# Patient Record
Sex: Female | Born: 1935 | Race: Black or African American | Hispanic: No | Smoking: Never smoker
Health system: Southern US, Community
[De-identification: ages and names within clinical notes are randomized; demographics above are authoritative.]

## PROBLEM LIST (undated history)

## (undated) DIAGNOSIS — Z923 Personal history of irradiation: Secondary | ICD-10-CM

## (undated) DIAGNOSIS — I10 Essential (primary) hypertension: Secondary | ICD-10-CM

## (undated) DIAGNOSIS — E785 Hyperlipidemia, unspecified: Secondary | ICD-10-CM

## (undated) DIAGNOSIS — M1990 Unspecified osteoarthritis, unspecified site: Secondary | ICD-10-CM

## (undated) DIAGNOSIS — N189 Chronic kidney disease, unspecified: Secondary | ICD-10-CM

## (undated) DIAGNOSIS — C50919 Malignant neoplasm of unspecified site of unspecified female breast: Secondary | ICD-10-CM

## (undated) DIAGNOSIS — Z9889 Other specified postprocedural states: Secondary | ICD-10-CM

## (undated) DIAGNOSIS — C50411 Malignant neoplasm of upper-outer quadrant of right female breast: Principal | ICD-10-CM

## (undated) DIAGNOSIS — N390 Urinary tract infection, site not specified: Secondary | ICD-10-CM

## (undated) DIAGNOSIS — R112 Nausea with vomiting, unspecified: Secondary | ICD-10-CM

## (undated) HISTORY — PX: BREAST BIOPSY: SHX20

## (undated) HISTORY — PX: CATARACT EXTRACTION: SUR2

## (undated) HISTORY — DX: Essential (primary) hypertension: I10

## (undated) HISTORY — PX: BREAST LUMPECTOMY: SHX2

## (undated) HISTORY — DX: Chronic kidney disease, unspecified: N18.9

## (undated) HISTORY — DX: Malignant neoplasm of upper-outer quadrant of right female breast: C50.411

## (undated) HISTORY — DX: Hyperlipidemia, unspecified: E78.5

## (undated) HISTORY — DX: Urinary tract infection, site not specified: N39.0

## (undated) HISTORY — DX: Unspecified osteoarthritis, unspecified site: M19.90

## (undated) HISTORY — PX: COLONOSCOPY: SHX174

---
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
## 1976-03-05 LAB — HM PAP SMEAR

## 1978-02-21 HISTORY — PX: ABDOMINAL HYSTERECTOMY: SHX81

## 1987-02-22 HISTORY — PX: CHOLECYSTECTOMY: SHX55

## 2002-12-03 ENCOUNTER — Encounter: Admission: RE | Admit: 2002-12-03 | Discharge: 2002-12-17 | Payer: Self-pay | Admitting: *Deleted

## 2006-04-05 LAB — HM COLONOSCOPY

## 2006-04-12 ENCOUNTER — Ambulatory Visit (HOSPITAL_COMMUNITY): Admission: RE | Admit: 2006-04-12 | Discharge: 2006-04-12 | Payer: Self-pay | Admitting: Gastroenterology

## 2010-10-04 ENCOUNTER — Ambulatory Visit (INDEPENDENT_AMBULATORY_CARE_PROVIDER_SITE_OTHER): Payer: No Typology Code available for payment source | Admitting: Family Medicine

## 2010-10-04 ENCOUNTER — Encounter: Payer: Self-pay | Admitting: Family Medicine

## 2010-10-04 DIAGNOSIS — E119 Type 2 diabetes mellitus without complications: Secondary | ICD-10-CM

## 2010-10-04 DIAGNOSIS — N200 Calculus of kidney: Secondary | ICD-10-CM

## 2010-10-04 DIAGNOSIS — E785 Hyperlipidemia, unspecified: Secondary | ICD-10-CM

## 2010-10-04 DIAGNOSIS — I10 Essential (primary) hypertension: Secondary | ICD-10-CM

## 2010-10-04 LAB — LIPID PANEL
HDL: 76.5 mg/dL (ref >39.00)
Total CHOL/HDL Ratio: 2
VLDL: 18.6 mg/dL (ref 0.0–40.0)

## 2010-10-04 LAB — BASIC METABOLIC PANEL
Calcium: 8.8 mg/dL (ref 8.4–10.5)
GFR: 106.78 mL/min (ref >60.00)
Glucose, Bld: 234 mg/dL — ABNORMAL HIGH (ref 70–99)
Potassium: 3.8 mEq/L (ref 3.5–5.1)
Sodium: 138 mEq/L (ref 135–145)

## 2010-10-04 LAB — HEPATIC FUNCTION PANEL
AST: 21 U/L (ref 0–37)
Albumin: 3.9 g/dL (ref 3.5–5.2)
Alkaline Phosphatase: 71 U/L (ref 39–117)
Bilirubin, Direct: 0 mg/dL (ref 0.0–0.3)

## 2010-10-04 MED ORDER — HYDROCHLOROTHIAZIDE 25 MG PO TABS: 25.0000 mg | ORAL_TABLET | Freq: Every day | ORAL | Status: DC

## 2010-10-04 NOTE — Progress Notes (Signed)
  Subjective:    Patient ID: [name] [name], female    DOB: [birth year], 75 y.o.   MRN: [id number]  HPI New patient to establish care. Past medical history significant for osteoarthritis, type 2 diabetes, hypertension, hyperlipidemia, and kidney stones. Multiple drug allergies as listed. Previous intolerance to Actos. Cannot take lisinopril secondary to cough and chest pain. Currently only takes metformin 500 mg once daily and baby aspirin 81 mg. Previously on Zocor and stopped 2 weeks ago after noticed some itching. Itching resolved after stopping Zocor. No history of CAD.  Report of Pneumovax age 57. Refuses flu vaccines. Previous intolerance. Not monitoring blood sugars or blood pressure at home. Currently not taking any blood pressure medication  Mother had uterine cancer. Father had heart disease age 44. Brothers and sisters with diabetes.  Patient is widowed. She is retired. 3 children and 4 grandchildren. No smoking history. No alcohol use.   Review of Systems  Constitutional: Negative for appetite change and fatigue.  HENT: Negative for trouble swallowing.   Eyes: Negative for visual disturbance.  Respiratory: Negative for cough, chest tightness, shortness of breath and wheezing.   Cardiovascular: Negative for chest pain, palpitations and leg swelling.  Gastrointestinal: Negative for abdominal pain.  Genitourinary: Negative for dysuria.  Skin: Negative for rash.  Neurological: Negative for dizziness, seizures, syncope, weakness, light-headedness and headaches.  Hematological: Negative for adenopathy.  Psychiatric/Behavioral: Negative for dysphoric mood.       Objective:   Physical Exam  Constitutional: She is oriented to person, place, and time. She appears well-developed and well-nourished.  HENT:  Right Ear: External ear normal.  Left Ear: External ear normal.  Mouth/Throat: Oropharynx is clear and moist.  Eyes: Pupils are equal, round, and reactive to light.  Neck: Neck  supple. No thyromegaly present.  Cardiovascular: Normal rate and regular rhythm.  Exam reveals no gallop.   No murmur heard. Pulmonary/Chest: Effort normal and breath sounds normal. No respiratory distress. She has no wheezes. She has no rales.  Musculoskeletal: She exhibits no edema.       Feet reveal no skin lesions. Good distal foot pulses. Good capillary refill. No calluses. Normal sensation with monofilament testing   Lymphadenopathy:    She has no cervical adenopathy.  Neurological: She is alert and oriented to person, place, and time. No cranial nerve deficit.  Psychiatric: She has a normal mood and affect. Her behavior is normal.          Assessment & Plan:  #1 type 2 diabetes. Reassess A1c #2 hyperlipidemia. Previous reported questionable intolerance to Zocor off medication for 2 weeks now. Reassess lipids. Consider Lipitor if up. #3 hypertension currently untreated. HCTZ 25 mg once daily and reassess blood pressure one month. Check basic metabolic panel.  Consider ARB then if still up with ? Intolerance to ACE in past. #4 history of kidney stones

## 2010-10-05 ENCOUNTER — Other Ambulatory Visit: Payer: Self-pay | Admitting: Family Medicine

## 2010-10-05 MED ORDER — METFORMIN HCL ER 500 MG PO TB24: 500.0000 mg | ORAL_TABLET | Freq: Two times a day (BID) | ORAL | Status: DC

## 2010-10-05 MED ORDER — ATORVASTATIN CALCIUM 20 MG PO TABS: 20.0000 mg | ORAL_TABLET | Freq: Every day | ORAL | Status: DC

## 2010-10-05 NOTE — Progress Notes (Signed)
Quick Note:  Pt informed and she has OV scheduled already in one month ______

## 2010-10-13 ENCOUNTER — Telehealth: Payer: Self-pay

## 2010-10-13 NOTE — Telephone Encounter (Signed)
Pt called to see if Rx for metformin has been sent to pharmacy.  Pt aware that it has

## 2010-11-04 ENCOUNTER — Encounter: Payer: Self-pay | Admitting: Family Medicine

## 2010-11-04 ENCOUNTER — Ambulatory Visit (INDEPENDENT_AMBULATORY_CARE_PROVIDER_SITE_OTHER): Payer: No Typology Code available for payment source | Admitting: Family Medicine

## 2010-11-04 DIAGNOSIS — E119 Type 2 diabetes mellitus without complications: Secondary | ICD-10-CM

## 2010-11-04 DIAGNOSIS — I10 Essential (primary) hypertension: Secondary | ICD-10-CM

## 2010-11-04 DIAGNOSIS — E785 Hyperlipidemia, unspecified: Secondary | ICD-10-CM

## 2010-11-04 MED ORDER — ONETOUCH LANCETS MISC: Status: DC

## 2010-11-04 MED ORDER — METFORMIN HCL ER 500 MG PO TB24: 500.0000 mg | ORAL_TABLET | Freq: Two times a day (BID) | ORAL | Status: DC

## 2010-11-04 NOTE — Progress Notes (Signed)
  Subjective:    Patient ID: [name] [name], female    DOB: [birth year], 76 y.o.   MRN: [id number]  HPI Medical followup. She has history of obesity, type 2 diabetes, hypertension, and hyperlipidemia. Recent A1c 8.5%. Fasting blood sugars around 140-160. We increased her metformin. She started a walking program and has already lost about 3 pounds. Eye exam in July reportedly normal.  Recent itching with simvastatin. We switched to Lipitor and she has had no adverse side effects. We plan repeat lipids in about 2 months. No history of CAD or peripheral vascular disease. History of elevated blood pressure. Initiated HCTZ last visit and blood pressure improved. No orthostasis. Previous reported itching with ACE inhibitor   Review of Systems  Constitutional: Negative for appetite change, fatigue and unexpected weight change.  Respiratory: Negative for cough and shortness of breath.   Cardiovascular: Negative for chest pain and leg swelling.  Genitourinary: Negative for dysuria.  Neurological: Negative for dizziness, syncope and headaches.  Psychiatric/Behavioral: Negative for dysphoric mood.       Objective:   Physical Exam  Constitutional: She is oriented to person, place, and time. She appears well-developed and well-nourished.  HENT:  Mouth/Throat: Oropharynx is clear and moist.  Neck: Neck supple. No thyromegaly present.  Cardiovascular: Normal rate, regular rhythm and normal heart sounds.   Pulmonary/Chest: Effort normal and breath sounds normal. No respiratory distress. She has no wheezes. She has no rales.  Musculoskeletal: She exhibits no edema.  Neurological: She is alert and oriented to person, place, and time. No cranial nerve deficit.  Psychiatric: She has a normal mood and affect. Her behavior is normal.          Assessment & Plan:  #1 hypertension. Improved. Continue current medication. Continue weight loss efforts. #2 type 2 diabetes. History of suboptimal control.  Recheck A1c at followup in 2 months. Continue weight loss efforts. New home glucose monitor given #3 hyperlipidemia. Recent change to Lipitor. No intolerance. Reassess lipids in 2 months

## 2010-11-04 NOTE — Patient Instructions (Signed)
Continue walking and weight loss efforts. We will plan to check your cholesterol and hemoglobin A1c at followup in 2 months

## 2010-11-18 ENCOUNTER — Ambulatory Visit: Payer: No Typology Code available for payment source | Admitting: Family Medicine

## 2011-01-11 ENCOUNTER — Ambulatory Visit (INDEPENDENT_AMBULATORY_CARE_PROVIDER_SITE_OTHER): Payer: No Typology Code available for payment source | Admitting: Family Medicine

## 2011-01-11 ENCOUNTER — Encounter: Payer: Self-pay | Admitting: Family Medicine

## 2011-01-11 DIAGNOSIS — I10 Essential (primary) hypertension: Secondary | ICD-10-CM

## 2011-01-11 DIAGNOSIS — E785 Hyperlipidemia, unspecified: Secondary | ICD-10-CM

## 2011-01-11 DIAGNOSIS — E119 Type 2 diabetes mellitus without complications: Secondary | ICD-10-CM

## 2011-01-11 LAB — LIPID PANEL
HDL: 63 mg/dL (ref >39.00)
Total CHOL/HDL Ratio: 2
VLDL: 16 mg/dL (ref 0.0–40.0)

## 2011-01-11 LAB — HEMOGLOBIN A1C: Hgb A1c MFr Bld: 9.2 % — ABNORMAL HIGH (ref 4.6–6.5)

## 2011-01-11 LAB — HEPATIC FUNCTION PANEL: Total Bilirubin: 1.4 mg/dL — ABNORMAL HIGH (ref 0.3–1.2)

## 2011-01-11 NOTE — Progress Notes (Signed)
  Subjective:    Patient ID: [name] [name], female    DOB: 05/06/1934, 76 y.o.   MRN: [id number]  HPI Patient seen for medical followup. Recently switched lipid medicine to Lipitor. Tolerating well. Takes HCTZ for hypertension. No dizziness. Patient's blood sugars not well controlled by home fasting readings. Mostly fasting from 150-180. Recent A1c 8.4%. We titrated her metformin then. Denies any side effects. No chest pains or dizziness.  Compliant with all medications.  Past Medical History  Diagnosis Date  . Arthritis   . Diabetes mellitus   . Hyperlipidemia   . Hypertension   . Chronic kidney disease     stones  . UTI (urinary tract infection)    Past Surgical History  Procedure Date  . Cholecystectomy 1989  . Tonsillectomy 1953  . Abdominal hysterectomy 1980    reports that she has never smoked. She does not have any smokeless tobacco history on file. Her alcohol and drug histories not on file. family history includes Cancer in her mother; Diabetes in her brother and sister; and Heart disease (age of onset:66) in her father. Allergies  Allergen Reactions  . Actos (Pioglitazone Hydrochloride)   . Lisinopril   . Penicillins   . Zocor (Simvastatin - High Dose)     itiching      Review of Systems  Constitutional: Negative for fever, appetite change, fatigue and unexpected weight change.  Eyes: Negative for visual disturbance.  Respiratory: Negative for cough and shortness of breath.   Cardiovascular: Negative for chest pain.  Gastrointestinal: Negative for abdominal pain.  Genitourinary: Negative for dysuria.  Neurological: Negative for dizziness and syncope.       Objective:   Physical Exam  Constitutional: She is oriented to person, place, and time. She appears well-developed and well-nourished. No distress.  Neck: Neck supple. No thyromegaly present.  Cardiovascular: Normal rate, regular rhythm and normal heart sounds.   No murmur heard. Pulmonary/Chest:  Effort normal and breath sounds normal. No respiratory distress. She has no wheezes. She has no rales.  Musculoskeletal: She exhibits no edema.  Lymphadenopathy:    She has no cervical adenopathy.  Neurological: She is alert and oriented to person, place, and time.          Assessment & Plan:  #1 type 2 diabetes. History of poor control. Reassess A1c. If still up consider titration of metformin versus additional medication  #2 hyperlipidemia. Check lipid and hepatic panel. #3 hypertension. Marginal control. Work on weight loss and consider addition of angiotensin receptor blocker in 3 months if not further improved

## 2011-01-12 ENCOUNTER — Other Ambulatory Visit: Payer: Self-pay | Admitting: Family Medicine

## 2011-01-12 MED ORDER — GLIMEPIRIDE 4 MG PO TABS: 4.0000 mg | ORAL_TABLET | ORAL | Status: DC

## 2011-01-12 NOTE — Progress Notes (Signed)
Quick Note:  Spoke with pt - informed of labs and change to med list - pt does have appt in 3 mos - ______

## 2011-03-09 ENCOUNTER — Encounter: Payer: Self-pay | Admitting: Family Medicine

## 2011-03-09 ENCOUNTER — Ambulatory Visit (INDEPENDENT_AMBULATORY_CARE_PROVIDER_SITE_OTHER): Payer: No Typology Code available for payment source | Admitting: Family Medicine

## 2011-03-09 VITALS — BP 140/82 | Temp 97.7°F | Wt 191.0 lb

## 2011-03-09 DIAGNOSIS — J209 Acute bronchitis, unspecified: Secondary | ICD-10-CM

## 2011-03-09 MED ORDER — BENZONATATE 200 MG PO CAPS: 200.0000 mg | ORAL_CAPSULE | Freq: Three times a day (TID) | ORAL | Status: AC | PRN

## 2011-03-09 NOTE — Progress Notes (Signed)
  Subjective:    Patient ID: [name] [name], female    DOB: 06/22/1934, 77 y.o.   MRN: [id number]  HPI  Patient seen with 2 week history of dry cough. No fever. No dyspnea. No chills. Cough actually somewhat improved. Initially had some sneezing and nasal congestion which is resolved. She's not taking medications. Chronic problems include history of type 2 diabetes hypertension, and hyperlipidemia. Not taking any of her medications today.   Review of Systems  Constitutional: Negative for fever, chills and fatigue.  HENT: Negative for ear pain and congestion.   Respiratory: Positive for cough. Negative for shortness of breath and wheezing.   Cardiovascular: Negative for chest pain, palpitations and leg swelling.       Objective:   Physical Exam  Constitutional: She appears well-developed and well-nourished.  HENT:  Right Ear: External ear normal.  Left Ear: External ear normal.  Mouth/Throat: Oropharynx is clear and moist.  Neck: Neck supple.  Cardiovascular: Normal rate and regular rhythm.   Pulmonary/Chest: Effort normal and breath sounds normal. No respiratory distress. She has no wheezes. She has no rales.  Musculoskeletal: She exhibits no edema.  Lymphadenopathy:    She has no cervical adenopathy.          Assessment & Plan:  Acute viral bronchitis. Tessalon Perles 200 mg every 8 hours prn cough. Followup immediately for any fever or worsening symptoms

## 2011-03-09 NOTE — Patient Instructions (Signed)

## 2011-04-12 ENCOUNTER — Encounter: Payer: Self-pay | Admitting: Family Medicine

## 2011-04-12 ENCOUNTER — Ambulatory Visit (INDEPENDENT_AMBULATORY_CARE_PROVIDER_SITE_OTHER): Payer: No Typology Code available for payment source | Admitting: Family Medicine

## 2011-04-12 VITALS — BP 158/88 | Temp 97.9°F | Wt 191.0 lb

## 2011-04-12 DIAGNOSIS — E785 Hyperlipidemia, unspecified: Secondary | ICD-10-CM

## 2011-04-12 DIAGNOSIS — E119 Type 2 diabetes mellitus without complications: Secondary | ICD-10-CM

## 2011-04-12 DIAGNOSIS — I10 Essential (primary) hypertension: Secondary | ICD-10-CM

## 2011-04-12 LAB — HM DIABETES FOOT EXAM: HM Diabetic Foot Exam: NORMAL

## 2011-04-12 LAB — HEMOGLOBIN A1C: Hgb A1c MFr Bld: 8.2 % — ABNORMAL HIGH (ref 4.6–6.5)

## 2011-04-12 MED ORDER — LOSARTAN POTASSIUM-HCTZ 50-12.5 MG PO TABS: 1.0000 | ORAL_TABLET | Freq: Every day | ORAL | Status: DC

## 2011-04-12 NOTE — Progress Notes (Signed)
  Subjective:    Patient ID: [name] [name], female    DOB: 1935/01/27, 77 y.o.   MRN: [id number]  HPI  Followup type 2 diabetes and hypertension. Recent A1c 9.2%. We added glimepiride 4 mg daily to her metformin 500 mg twice a day. She still has quite variable blood sugars occasionally below 100 but some as high as 180. No symptoms of hyperglycemia. She denies any neuropathy issues. We discussed additional medications if not improved today.  Hyperlipidemia treated with Lipitor 20 mg daily. No myalgias. Recent lipids at goal.  Hypertension treated with HCTZ 25 mg daily. Blood pressures are ranging 140 systolic. No headaches or dizziness. No chest pain. Exercising 3 days per week.  Past Medical History  Diagnosis Date  . Arthritis   . Diabetes mellitus   . Hyperlipidemia   . Hypertension   . Chronic kidney disease     stones  . UTI (urinary tract infection)    Past Surgical History  Procedure Date  . Cholecystectomy 1989  . Tonsillectomy 1953  . Abdominal hysterectomy 1980    reports that she has never smoked. She does not have any smokeless tobacco history on file. Her alcohol and drug histories not on file. family history includes Cancer in her mother; Diabetes in her brother and sister; and Heart disease (age of onset:66) in her father. Allergies  Allergen Reactions  . Actos (Pioglitazone Hydrochloride)   . Lisinopril   . Penicillins   . Zocor (Simvastatin - High Dose)     itiching      Review of Systems  Constitutional: Negative for fatigue.  Eyes: Negative for visual disturbance.  Respiratory: Negative for cough, chest tightness, shortness of breath and wheezing.   Cardiovascular: Negative for chest pain, palpitations and leg swelling.  Neurological: Negative for dizziness, seizures, syncope, weakness, light-headedness and headaches.       Objective:   Physical Exam  Constitutional: She is oriented to person, place, and time. She appears well-developed and  well-nourished.  Cardiovascular: Normal rate and regular rhythm.   Pulmonary/Chest: Effort normal and breath sounds normal. No respiratory distress. She has no wheezes. She has no rales.  Musculoskeletal: She exhibits no edema.       Feet reveal no skin lesions. Good distal foot pulses. Good capillary refill. No calluses. Normal sensation with monofilament testing   Neurological: She is alert and oriented to person, place, and time.  Psychiatric: She has a normal mood and affect. Her behavior is normal.          Assessment & Plan:  #1 hypertension. Poorly controlled. Discontinue HCTZ. Start losartan HCTZ 50/12.5 one daily and reassess him in 3 months  #2 type 2 diabetes. History poor control. Recheck A1c today with recent addition of glimepiride. Titrate metformin further if not to goal  #3 hyperlipidemia at goal. Consider decrease Lipitor to 20 mg one half tablet daily

## 2011-04-14 NOTE — Progress Notes (Signed)
Quick Note:  Pt informed on home VM ______ 

## 2011-05-13 ENCOUNTER — Other Ambulatory Visit: Payer: Self-pay | Admitting: Family Medicine

## 2011-06-09 LAB — HM DIABETES EYE EXAM: HM Diabetic Eye Exam: NORMAL

## 2011-07-11 ENCOUNTER — Ambulatory Visit (INDEPENDENT_AMBULATORY_CARE_PROVIDER_SITE_OTHER): Payer: No Typology Code available for payment source | Admitting: Family Medicine

## 2011-07-11 ENCOUNTER — Encounter: Payer: Self-pay | Admitting: Family Medicine

## 2011-07-11 VITALS — BP 150/78 | Temp 97.7°F | Wt 194.0 lb

## 2011-07-11 DIAGNOSIS — E119 Type 2 diabetes mellitus without complications: Secondary | ICD-10-CM

## 2011-07-11 DIAGNOSIS — M21619 Bunion of unspecified foot: Secondary | ICD-10-CM

## 2011-07-11 DIAGNOSIS — I10 Essential (primary) hypertension: Secondary | ICD-10-CM

## 2011-07-11 LAB — HEMOGLOBIN A1C: Hgb A1c MFr Bld: 7.2 % — ABNORMAL HIGH (ref 4.6–6.5)

## 2011-07-11 NOTE — Progress Notes (Signed)
  Subjective:    Patient ID: [name] [name], female    DOB: 1934-09-23, 77 y.o.   MRN: [id number]  HPI  Medical followup. Recent poor control of hypertension. We changed to losartan HCTZ. Compliant with medication. No side effects. Blood pressure around 130/70 at home. Overall improved. Type 2 diabetes. Last A1c 8.2%. Recent fasting blood sugars run around130. We had recently increased her metformin. No side effects. No hypoglycemia. She is exercising about 2 days per week. Denies any chest pain.  Patient has bilateral foot bunions. These cause mild pain with ambulation mostly during cold weather. Denies any foot sores.  Past Medical History  Diagnosis Date  . Arthritis   . Diabetes mellitus   . Hyperlipidemia   . Hypertension   . Chronic kidney disease     stones  . UTI (urinary tract infection)    Past Surgical History  Procedure Date  . Cholecystectomy 1989  . Tonsillectomy 1953  . Abdominal hysterectomy 1980    reports that she has never smoked. She does not have any smokeless tobacco history on file. Her alcohol and drug histories not on file. family history includes Cancer in her mother; Diabetes in her brother and sister; and Heart disease (age of onset:66) in her father. Allergies  Allergen Reactions  . Actos (Pioglitazone Hydrochloride)   . Lisinopril   . Penicillins   . Zocor (Simvastatin - High Dose)     itiching      Review of Systems  Constitutional: Negative for fatigue.  Eyes: Negative for visual disturbance.  Respiratory: Negative for cough, chest tightness, shortness of breath and wheezing.   Cardiovascular: Negative for chest pain, palpitations and leg swelling.  Neurological: Negative for dizziness, seizures, syncope, weakness, light-headedness and headaches.       Objective:   Physical Exam  Constitutional: She appears well-developed and well-nourished.  HENT:  Right Ear: External ear normal.  Left Ear: External ear normal.  Mouth/Throat:  Oropharynx is clear and moist.  Neck: Neck supple. No thyromegaly present.  Cardiovascular: Normal rate and regular rhythm.   Pulmonary/Chest: Effort normal and breath sounds normal. No respiratory distress. She has no wheezes. She has no rales.  Musculoskeletal:       No edema. No foot lesions. Bilateral bunions. No callus. No ulceration. Good foot pulses.          Assessment & Plan:  #1 hypertension. Improved. Continue weight loss efforts. Reassess 4 months #2 type 2 diabetes. Recent suboptimal control, though improving. Recheck A1c. Further titration of metformin if not closer to goal  #3 bilateral bunions. Discussed possible referral to foot specialist for this point she wishes to wait #4 health maintenance. Discussed measures such as mammography screening she declines.

## 2011-07-13 ENCOUNTER — Other Ambulatory Visit: Payer: Self-pay | Admitting: *Deleted

## 2011-07-13 MED ORDER — GLUCOSE BLOOD VI STRP: ORAL_STRIP | Status: DC

## 2011-07-13 NOTE — Progress Notes (Signed)
Quick Note:  Pt informed, has F/U in 4 months scheduled ______

## 2011-07-25 ENCOUNTER — Encounter: Payer: Self-pay | Admitting: Family Medicine

## 2011-10-10 ENCOUNTER — Other Ambulatory Visit: Payer: Self-pay | Admitting: Family Medicine

## 2011-11-08 ENCOUNTER — Other Ambulatory Visit: Payer: Self-pay | Admitting: Family Medicine

## 2011-11-14 ENCOUNTER — Encounter: Payer: Self-pay | Admitting: Family Medicine

## 2011-11-14 ENCOUNTER — Ambulatory Visit (INDEPENDENT_AMBULATORY_CARE_PROVIDER_SITE_OTHER): Payer: No Typology Code available for payment source | Admitting: Family Medicine

## 2011-11-14 VITALS — BP 138/70 | Temp 98.2°F | Wt 195.0 lb

## 2011-11-14 DIAGNOSIS — E785 Hyperlipidemia, unspecified: Secondary | ICD-10-CM

## 2011-11-14 DIAGNOSIS — I10 Essential (primary) hypertension: Secondary | ICD-10-CM

## 2011-11-14 DIAGNOSIS — E119 Type 2 diabetes mellitus without complications: Secondary | ICD-10-CM

## 2011-11-14 NOTE — Progress Notes (Signed)
  Subjective:    Patient ID: [name] [name], female    DOB: 1934/08/16, 77 y.o.   MRN: [id number]  HPI  Medical followup. Patient has history of type 2 diabetes, hyperlipidemia, and hypertension. Compliant with all medications. She had one recent blood sugar of 56 otherwise no hypoglycemia. Fasting blood sugars ranging around 120 to 130. Recent A1c 7.2% she has steadily improved over the past year. She is walking some for exercise.  No recent orthostasis. Takes losartan HCTZ for blood pressure. Blood pressure well-controlled. Remains on atorvastatin 20 mg one half tablet daily for hyperlipidemia. No recent chest pains. No cardiac history.  Past Medical History  Diagnosis Date  . Arthritis   . Diabetes mellitus   . Hyperlipidemia   . Hypertension   . Chronic kidney disease     stones  . UTI (urinary tract infection)    Past Surgical History  Procedure Date  . Cholecystectomy 1989  . Tonsillectomy 1953  . Abdominal hysterectomy 1980    reports that she has never smoked. She does not have any smokeless tobacco history on file. Her alcohol and drug histories not on file. family history includes Cancer in her mother; Diabetes in her brother and sister; and Heart disease (age of onset:66) in her father. Allergies  Allergen Reactions  . Actos (Pioglitazone Hydrochloride)   . Lisinopril   . Penicillins   . Zocor (Simvastatin - High Dose)     itiching     Review of Systems  Constitutional: Negative for fatigue.  Eyes: Negative for visual disturbance.  Respiratory: Negative for cough, chest tightness, shortness of breath and wheezing.   Cardiovascular: Negative for chest pain, palpitations and leg swelling.  Neurological: Negative for dizziness, seizures, syncope, weakness, light-headedness and headaches.       Objective:   Physical Exam  Constitutional: She is oriented to person, place, and time. She appears well-developed and well-nourished.  Eyes: Pupils are equal, round,  and reactive to light.  Neck: Neck supple. No thyromegaly present.  Cardiovascular: Normal rate and regular rhythm.  Exam reveals no gallop.   Pulmonary/Chest: Effort normal and breath sounds normal. No respiratory distress. She has no wheezes. She has no rales.  Musculoskeletal: She exhibits no edema.  Neurological: She is alert and oriented to person, place, and time.          Assessment & Plan:  #1 hypertension. Well controlled. Continue current medication. Check basic metabolic panel #2 type 2 diabetes. Improving control. Recheck A1c. Reminder for yearly eye exam  #3 hyperlipidemia. Check lipid and hepatic panel. #4 health maintenance. Flu vaccine recommended patient declines. She also declines other preventative measures such as mammography which we have discussed previously and again today

## 2011-11-15 LAB — BASIC METABOLIC PANEL
BUN: 15 mg/dL (ref 6–23)
Chloride: 103 mEq/L (ref 96–112)
Potassium: 4.2 mEq/L (ref 3.5–5.1)
Sodium: 142 mEq/L (ref 135–145)

## 2011-11-15 LAB — HEPATIC FUNCTION PANEL
ALT: 15 U/L (ref 0–35)
AST: 15 U/L (ref 0–37)
Albumin: 3.8 g/dL (ref 3.5–5.2)
Alkaline Phosphatase: 65 U/L (ref 39–117)
Total Bilirubin: 1 mg/dL (ref 0.3–1.2)

## 2011-11-15 LAB — LIPID PANEL
Cholesterol: 118 mg/dL (ref 0–200)
LDL Cholesterol: 43 mg/dL (ref 0–99)

## 2011-11-16 NOTE — Progress Notes (Signed)
Quick Note:  Pt informed ______ 

## 2012-01-12 ENCOUNTER — Other Ambulatory Visit: Payer: Self-pay | Admitting: *Deleted

## 2012-01-12 MED ORDER — LOSARTAN POTASSIUM-HCTZ 50-12.5 MG PO TABS: 1.0000 | ORAL_TABLET | Freq: Every day | ORAL | Status: DC

## 2012-03-09 ENCOUNTER — Other Ambulatory Visit: Payer: Self-pay | Admitting: Family Medicine

## 2012-04-07 ENCOUNTER — Other Ambulatory Visit: Payer: Self-pay | Admitting: Family Medicine

## 2012-04-27 ENCOUNTER — Other Ambulatory Visit: Payer: Self-pay | Admitting: Family Medicine

## 2012-05-10 ENCOUNTER — Other Ambulatory Visit: Payer: Self-pay

## 2012-05-10 MED ORDER — GLIMEPIRIDE 4 MG PO TABS: ORAL_TABLET | ORAL | Status: DC

## 2012-05-14 ENCOUNTER — Encounter: Payer: Self-pay | Admitting: Family Medicine

## 2012-05-14 ENCOUNTER — Ambulatory Visit (INDEPENDENT_AMBULATORY_CARE_PROVIDER_SITE_OTHER): Payer: No Typology Code available for payment source | Admitting: Family Medicine

## 2012-05-14 VITALS — BP 168/82 | Temp 98.0°F | Wt 196.0 lb

## 2012-05-14 DIAGNOSIS — E119 Type 2 diabetes mellitus without complications: Secondary | ICD-10-CM

## 2012-05-14 DIAGNOSIS — I10 Essential (primary) hypertension: Secondary | ICD-10-CM

## 2012-05-14 DIAGNOSIS — E785 Hyperlipidemia, unspecified: Secondary | ICD-10-CM

## 2012-05-14 LAB — HEMOGLOBIN A1C: Hgb A1c MFr Bld: 7.6 % — ABNORMAL HIGH (ref 4.6–6.5)

## 2012-05-14 MED ORDER — LOSARTAN POTASSIUM-HCTZ 50-12.5 MG PO TABS: 1.0000 | ORAL_TABLET | Freq: Every day | ORAL | Status: DC

## 2012-05-14 NOTE — Progress Notes (Signed)
  Subjective:    Patient ID: [name] [name], female    DOB: March 07, 1934, 78 y.o.   MRN: [id number]  HPI Medical followup  Type 2 diabetes. Last A1c 7.4% Having difficulties with getting test strips paid for. Not checking blood sugars regularly No symptoms of hypo-or hyperglycemia  Hypertension. Ran out of blood pressure medication last Thursday. Prior to running out blood pressures consistently controlled with systolics 130-140 No headaches. No dizziness. No chest pains.  Hyperlipidemia treated with Lipitor. Lipids in 6 months ago. No history of coronary disease or peripheral vascular disease. Nonsmoker  Past Medical History  Diagnosis Date  . Arthritis   . Diabetes mellitus   . Hyperlipidemia   . Hypertension   . Chronic kidney disease     stones  . UTI (urinary tract infection)    Past Surgical History  Procedure Laterality Date  . Cholecystectomy  1989  . Tonsillectomy  1953  . Abdominal hysterectomy  1980    reports that she has never smoked. She does not have any smokeless tobacco history on file. Her alcohol and drug histories are not on file. family history includes Cancer in her mother; Diabetes in her brother and sister; and Heart disease (age of onset: 35) in her father. Allergies  Allergen Reactions  . Actos (Pioglitazone Hydrochloride)   . Lisinopril   . Penicillins   . Zocor (Simvastatin - High Dose)     itiching      Review of Systems  Constitutional: Negative for fatigue and unexpected weight change.  Eyes: Negative for visual disturbance.  Respiratory: Negative for cough, chest tightness, shortness of breath and wheezing.   Cardiovascular: Negative for chest pain, palpitations and leg swelling.  Neurological: Negative for dizziness, seizures, syncope, weakness, light-headedness and headaches.       Objective:   Physical Exam  Constitutional: She is oriented to person, place, and time. She appears well-developed and well-nourished.  Neck: Neck  supple. No thyromegaly present.  Cardiovascular: Normal rate and regular rhythm.  Exam reveals no gallop.   Pulmonary/Chest: Effort normal and breath sounds normal. No respiratory distress. She has no wheezes. She has no rales.  Musculoskeletal: She exhibits no edema.  Neurological: She is alert and oriented to person, place, and time.  Psychiatric: She has a normal mood and affect. Her behavior is normal.          Assessment & Plan:  #1 type 2 diabetes. History of marginal control. Recheck A1c. Consider titration of metformin if still up  #2 hypertension. Not controlled today but patient off blood pressure medication. Refill losartan HCTZ and monitor closely at home  #3 hyperlipidemia. Controlled by recent labs. Recheck in 6 months

## 2012-05-15 ENCOUNTER — Other Ambulatory Visit: Payer: Self-pay

## 2012-05-15 MED ORDER — METFORMIN HCL ER 500 MG PO TB24: ORAL_TABLET | ORAL | Status: DC

## 2012-05-15 NOTE — Telephone Encounter (Signed)
Rx sent to pharmacy   

## 2012-05-15 NOTE — Progress Notes (Signed)
Quick Note:  Called and spoke with pt and pt is aware. ______ 

## 2012-05-15 NOTE — Progress Notes (Signed)
Quick Note:  Rx sent to pharmacy. ______ 

## 2012-06-01 ENCOUNTER — Other Ambulatory Visit: Payer: Self-pay | Admitting: Family Medicine

## 2012-11-02 ENCOUNTER — Other Ambulatory Visit: Payer: Self-pay | Admitting: Family Medicine

## 2012-11-12 ENCOUNTER — Encounter: Payer: Self-pay | Admitting: Family Medicine

## 2012-11-12 ENCOUNTER — Ambulatory Visit (INDEPENDENT_AMBULATORY_CARE_PROVIDER_SITE_OTHER): Payer: No Typology Code available for payment source | Admitting: Family Medicine

## 2012-11-12 VITALS — BP 128/70 | HR 105 | Temp 98.3°F | Wt 193.0 lb

## 2012-11-12 DIAGNOSIS — I10 Essential (primary) hypertension: Secondary | ICD-10-CM

## 2012-11-12 DIAGNOSIS — E669 Obesity, unspecified: Secondary | ICD-10-CM | POA: Insufficient documentation

## 2012-11-12 DIAGNOSIS — E785 Hyperlipidemia, unspecified: Secondary | ICD-10-CM

## 2012-11-12 DIAGNOSIS — E119 Type 2 diabetes mellitus without complications: Secondary | ICD-10-CM

## 2012-11-12 LAB — HEMOGLOBIN A1C: Hgb A1c MFr Bld: 7.9 % — ABNORMAL HIGH (ref 4.6–6.5)

## 2012-11-12 LAB — HEPATIC FUNCTION PANEL
ALT: 16 U/L (ref 0–35)
AST: 18 U/L (ref 0–37)
Alkaline Phosphatase: 71 U/L (ref 39–117)
Total Bilirubin: 1.1 mg/dL (ref 0.3–1.2)

## 2012-11-12 LAB — BASIC METABOLIC PANEL
Calcium: 9.3 mg/dL (ref 8.4–10.5)
Creatinine, Ser: 0.7 mg/dL (ref 0.4–1.2)
GFR: 101.09 mL/min (ref >60.00)
Glucose, Bld: 181 mg/dL — ABNORMAL HIGH (ref 70–99)
Sodium: 140 mEq/L (ref 135–145)

## 2012-11-12 LAB — HM DIABETES FOOT EXAM: HM Diabetic Foot Exam: NORMAL

## 2012-11-12 LAB — LIPID PANEL: HDL: 66.1 mg/dL (ref >39.00)

## 2012-11-12 MED ORDER — METFORMIN HCL 500 MG PO TABS: ORAL_TABLET | ORAL | Status: DC

## 2012-11-12 NOTE — Progress Notes (Signed)
  Subjective:    Patient ID: [name] [name], female    DOB: [birth year], 79 y.o.   MRN: [id number]  HPI Patient is seen for medical followup She has history of type 2 diabetes, hypertension, hyperlipidemia. Medications reviewed. Compliant with all. Blood sugars generally ranging between 66 and low 100s. No hypoglycemic symptoms. No symptoms of hyperglycemia. Blood pressure been well controlled on losartan HCTZ. She takes atorvastatin for hyperlipidemia. No myalgias. No recent falls. She declines flu vaccine. She's had previous Pneumovax.  Past Medical History  Diagnosis Date  . Arthritis   . Diabetes mellitus   . Hyperlipidemia   . Hypertension   . Chronic kidney disease     stones  . UTI (urinary tract infection)    Past Surgical History  Procedure Laterality Date  . Cholecystectomy  1989  . Tonsillectomy  1953  . Abdominal hysterectomy  1980    reports that she has never smoked. She does not have any smokeless tobacco history on file. Her alcohol and drug histories are not on file. family history includes Cancer in her mother; Diabetes in her brother and sister; Heart disease (age of onset: 62) in her father. Allergies  Allergen Reactions  . Actos [Pioglitazone Hydrochloride]   . Lisinopril   . Penicillins   . Zocor [Simvastatin - High Dose]     itiching      Review of Systems  Constitutional: Negative for appetite change, fatigue and unexpected weight change.  Eyes: Negative for visual disturbance.  Respiratory: Negative for cough, chest tightness, shortness of breath and wheezing.   Cardiovascular: Negative for chest pain, palpitations and leg swelling.  Endocrine: Negative for polydipsia and polyuria.  Neurological: Negative for dizziness, seizures, syncope, weakness, light-headedness and headaches.       Objective:   Physical Exam  Constitutional: She appears well-developed and well-nourished. No distress.  Neck: Neck supple. No thyromegaly present.   Cardiovascular: Normal rate and regular rhythm.  Exam reveals no gallop.   Pulmonary/Chest: Effort normal and breath sounds normal. No respiratory distress. She has no wheezes. She has no rales.  Musculoskeletal: She exhibits no edema.  Skin:  Feet reveal no skin lesions. Good distal foot pulses. Good capillary refill. No calluses. Normal sensation with monofilament testing           Assessment & Plan:  #1  Type 2 diabetes.  Controlled.  Repeat A1C. #2  Hypertension. Stable. #3  Hyperlipidemia. Repeat lipids and hepatic. #4  Health maintenance.  Flu vaccine refused by pt. #5 obestiy.  We have recommended weight loss with strategies discussed.

## 2012-11-13 ENCOUNTER — Other Ambulatory Visit: Payer: Self-pay | Admitting: Family Medicine

## 2012-11-13 DIAGNOSIS — E875 Hyperkalemia: Secondary | ICD-10-CM

## 2012-11-13 NOTE — Addendum Note (Signed)
Addended by: Thomasena Edis on: 11/13/2012 09:25 AM   Modules accepted: Orders

## 2013-03-20 ENCOUNTER — Other Ambulatory Visit: Payer: Self-pay | Admitting: Family Medicine

## 2013-05-14 ENCOUNTER — Telehealth: Payer: Self-pay | Admitting: Family Medicine

## 2013-05-14 ENCOUNTER — Ambulatory Visit (INDEPENDENT_AMBULATORY_CARE_PROVIDER_SITE_OTHER): Payer: No Typology Code available for payment source | Admitting: Family Medicine

## 2013-05-14 ENCOUNTER — Encounter: Payer: Self-pay | Admitting: Family Medicine

## 2013-05-14 VITALS — BP 140/78 | HR 86 | Temp 98.0°F | Wt 196.0 lb

## 2013-05-14 DIAGNOSIS — E876 Hypokalemia: Secondary | ICD-10-CM

## 2013-05-14 DIAGNOSIS — I10 Essential (primary) hypertension: Secondary | ICD-10-CM

## 2013-05-14 DIAGNOSIS — E119 Type 2 diabetes mellitus without complications: Secondary | ICD-10-CM

## 2013-05-14 LAB — BASIC METABOLIC PANEL
BUN: 12 mg/dL (ref 6–23)
CALCIUM: 9.2 mg/dL (ref 8.4–10.5)
CO2: 27 meq/L (ref 19–32)
Chloride: 102 mEq/L (ref 96–112)
Creatinine, Ser: 0.8 mg/dL (ref 0.4–1.2)
GFR: 93.43 mL/min (ref >60.00)
GLUCOSE: 235 mg/dL — AB (ref 70–99)
Potassium: 3.6 mEq/L (ref 3.5–5.1)
Sodium: 137 mEq/L (ref 135–145)

## 2013-05-14 LAB — HM DIABETES FOOT EXAM: HM Diabetic Foot Exam: NORMAL

## 2013-05-14 LAB — HEMOGLOBIN A1C: Hgb A1c MFr Bld: 7.6 % — ABNORMAL HIGH (ref 4.6–6.5)

## 2013-05-14 NOTE — Patient Instructions (Signed)
Potassium Content of Foods Potassium is a mineral found in many foods and drinks. It helps keep fluids and minerals balanced in your body and also affects how steadily your heart beats. The body needs potassium to control blood pressure and to keep the muscles and nervous system healthy. However, certain health conditions and medicine may require you to eat more or less potassium-rich foods and drinks. Your caregiver or dietitian will tell you how much potassium you should have each day. COMMON SERVING SIZES The list below tells you how big or small common portion sizes are:  1 oz.........4 stacked dice.  3 oz.........Deck of cards.  1 tsp........Tip of little finger.  1 tbsp......Thumb.  2 tbsp......Golf ball.   c...........Half of a fist.  1 c............A fist. FOODS AND DRINKS HIGH IN POTASSIUM More than 200 mg of potassium per serving. A serving size is  c (120 mL or noted gram weight) unless otherwise stated. While all the items on this list are high in potassium, some items are higher in potassium than others. Fruits  Apricots (sliced), 83 g.  Apricots (dried halves), 3 oz / 24 g.  Avocado (cubed),  c / 50 g.  Banana (sliced), 75 g.  Cantaloupe (cubed), 80 g.  Dates (pitted), 5 whole / 35 g.  Figs (dried), 4 whole / 32 g.  Guava, c / 55 g.  Honeydew, 1 wedge / 85 g.  Kiwi (sliced), 90 g.  Nectarine, 1 small / 129 g.  Orange, 1 medium / 131 g.  Orange juice.  Pomegranate seeds, 87 g.  Pomegranate juice.  Prunes (pitted), 3 whole / 30 g.  Prune juice, 3 oz / 90 mL.  Seedless raisins, 3 tbsp / 27 g. Vegetables  Artichoke,  of a medium / 64 g.  Asparagus (boiled), 90 g.  Baked beans,  c / 63 g.  Bamboo shoots,  c / 38 g.  Beets (cooked slices), 85 g.  Broccoli (boiled), 78 g.  Brussels sprout (boiled), 78 g.  Butternut squash (baked), 103 g.  Chickpea (cooked), 82 g.  Green peas (cooked), 80 g.  Hubbard squash (baked cubes),  c /  68 g.  Kidney beans (cooked), 5 tbsp / 55 g.  Lima beans (cooked),  c / 43 g.  Navy beans (cooked),  c / 61 g.  Potato (baked), 61 g.  Potato (boiled), 78 g.  Pumpkin (boiled), 123 g.  Refried beans,  c / 79 g.  Spinach (cooked),  c / 45 g.  Split peas (cooked),  c / 65 g.  Sun-dried tomatoes, 2 tbsp / 7 g.  Sweet potato (baked),  c / 50 g.  Tomato (chopped or sliced), 90 g.  Tomato juice.  Tomato paste, 4 tsp / 21 g.  Tomato sauce,  c / 61 g.  Vegetable juice.  White mushrooms (cooked), 78 g.  Yam (cooked or baked),  c / 34 g.  Zucchini squash (boiled), 90 g. Other Foods and Drinks  Almonds (whole),  c / 36 g.  Cashews (oil roasted),  c / 32 g.  Chocolate milk.  Chocolate pudding, 142 g.  Clams (steamed), 1.5 oz / 43 g.  Dark chocolate, 1.5 oz / 42 g.  Fish, 3 oz / 85 g.  King crab (steamed), 3 oz / 85 g.  Lobster (steamed), 4 oz / 113 g.  Milk (skim, 1%, 2%, whole), 1 c / 240 mL.  Milk chocolate, 2.3 oz / 66 g.  Milk shake.  Nonfat fruit   variety yogurt, 123 g.  Peanuts (oil roasted), 1 oz / 28 g.  Peanut butter, 2 tbsp / 32 g.  Pistachio nuts, 1 oz / 28 g.  Pumpkin seeds, 1 oz / 28 g.  Red meat (broiled, cooked, grilled), 3 oz / 85 g.  Scallops (steamed), 3 oz / 85 g.  Shredded wheat cereal (dry), 3 oblong biscuits / 75 g.  Spaghetti sauce,  c / 66 g.  Sunflower seeds (dry roasted), 1 oz / 28 g.  Veggie burger, 1 patty / 70 g. FOODS MODERATE IN POTASSIUM Between 150 mg and 200 mg per serving. A serving is  c (120 mL or noted gram weight) unless otherwise stated. Fruits  Grapefruit,  of the fruit / 123 g.  Grapefruit juice.  Pineapple juice.  Plums (sliced), 83 g.  Tangerine, 1 large / 120 g. Vegetables  Carrots (boiled), 78 g.  Carrots (sliced), 61 g.  Rhubarb (cooked with sugar), 120 g.  Rutabaga (cooked), 120 g.  Sweet corn (cooked), 75 g.  Yellow snap beans (cooked), 63 g. Other Foods and  Drinks   Bagel, 1 bagel / 98 g.  Chicken breast (roasted and chopped),  c / 70 g.  Chocolate ice cream / 66 g.  Pita bread, 1 large / 64 g.  Shrimp (steamed), 4 oz / 113 g.  Swiss cheese (diced), 70 g.  Vanilla ice cream, 66 g.  Vanilla pudding, 140 g. FOODS LOW IN POTASSIUM Less than 150 mg per serving. A serving size is  cup (120 mL or noted gram weight) unless otherwise stated. If you eat more than 1 serving of a food low in potassium, the food may be considered a food high in potassium. Fruits  Apple (slices), 55 g.  Apple juice.  Applesauce, 122 g.  Blackberries, 72 g.  Blueberries, 74 g.  Cranberries, 50 g.  Cranberry juice.  Fruit cocktail, 119 g.  Fruit punch.  Grapes, 46 g.  Grape juice.  Mandarin oranges (canned), 126 g.  Peach (slices), 77 g.  Pineapple (chunks), 83 g.  Raspberries, 62 g.  Red cherries (without pits), 78 g.  Strawberries (sliced), 83 g.  Watermelon (diced), 76 g. Vegetables  Alfalfa sprouts, 17 g.  Bell peppers (sliced), 46 g.  Cabbage (shredded), 35 g.  Cauliflower (boiled), 62 g.  Celery, 51 g.  Collard greens (boiled), 95 g.  Cucumber (sliced), 52 g.  Eggplant (cubed), 41 g.  Green beans (boiled), 63 g.  Lettuce (shredded), 1 c / 36 g.  Onions (sauteed), 44 g.  Radishes (sliced), 58 g.  Spaghetti squash, 51 g. Other Foods and Drinks  Angel food cake, 1 slice / 28 g.  Black tea.  Brown rice (cooked), 98 g.  Butter croissant, 1 medium / 57 g.  Carbonated soda.  Coffee.  Cheddar cheese (diced), 66 g.  Corn flake cereal (dry), 14 g.  Cottage cheese, 118 g.  Cream of rice cereal (cooked), 122 g.  Cream of wheat cereal (cooked), 126 g.  Crisped rice cereal (dry), 14 g.  Egg (boiled, fried, poached, omelet, scrambled), 1 large / 46 61 g.  English muffin, 1 muffin / 57 g.  Frozen ice pop, 1 pop / 55 g.  Graham cracker, 1 large rectangular cracker / 14 g.  Jelly beans, 112  g.  Non-dairy whipped topping.  Oatmeal, 88 g.  Orange sherbet, 74 g.  Puffed rice cereal (dry), 7 g.  Pasta (cooked), 70 g.  Rice cakes, 4 cakes / 36   g.  Sugared doughnut, 4 oz / 116 g.  White bread, 1 slice / 30 g.  White rice (cooked), 79 93 g.  Wild rice (cooked), 82 g.  Yellow cake, 1 slice / 68 g. Document Released: 09/21/2004 Document Revised: 01/25/2012 Document Reviewed: 06/24/2011 [employer] Patient Information 2014 [employer].  

## 2013-05-14 NOTE — Progress Notes (Signed)
   Subjective:    Patient ID: [name] [name], female    DOB: 1934/12/05, 79 y.o.   MRN: [id number]  HPI Medical followup. Patient has history of obesity, type 2 diabetes, hypertension, hyperlipidemia. Medications reviewed. Last A1c 7.9%. We increased her metformin. Fasting blood sugars over the past several months ranging between 90 and 150. No symptoms of hyper or hypoglycemia. Patient compliant with all medications. No orthostasis. No chest pains. No dyspnea. has some varicose veins her legs but these are asymptomatic. She has hyperlipidemia treated with Lipitor. Her lipids were stable when checked last fall. She has never smoked. No recent falls. She has history of mild hypokalemia by most recent labs. She does not take any regular potassium supplements.  Past Medical History  Diagnosis Date  . Arthritis   . Diabetes mellitus   . Hyperlipidemia   . Hypertension   . Chronic kidney disease     stones  . UTI (urinary tract infection)    Past Surgical History  Procedure Laterality Date  . Cholecystectomy  1989  . Tonsillectomy  1953  . Abdominal hysterectomy  1980    reports that she has never smoked. She does not have any smokeless tobacco history on file. Her alcohol and drug histories are not on file. family history includes Cancer in her mother; Diabetes in her brother and sister; Heart disease (age of onset: 77) in her father. Allergies  Allergen Reactions  . Actos [Pioglitazone Hydrochloride]   . Lisinopril   . Penicillins   . Zocor [Simvastatin - High Dose]     itiching      Review of Systems  Constitutional: Negative for fatigue.  Eyes: Negative for visual disturbance.  Respiratory: Negative for cough, chest tightness, shortness of breath and wheezing.   Cardiovascular: Negative for chest pain, palpitations and leg swelling.  Gastrointestinal: Negative for abdominal pain.  Endocrine: Negative for polydipsia and polyuria.  Neurological: Negative for dizziness,  seizures, syncope, weakness, light-headedness and headaches.       Objective:   Physical Exam  Constitutional: She is oriented to person, place, and time. She appears well-developed and well-nourished.  Cardiovascular: Normal rate and regular rhythm.   Pulmonary/Chest: Effort normal and breath sounds normal. No respiratory distress. She has no wheezes. She has no rales.  Musculoskeletal: She exhibits no edema.  Neurological: She is alert and oriented to person, place, and time.  Skin:  Feet reveal no skin lesions. Good distal foot pulses. Good capillary refill. No calluses. Normal sensation with monofilament testing           Assessment & Plan:  #1 type 2 diabetes. History of marginal control. Fasting blood sugars stable. Repeat A1c. Reminder for yearly eye exam. Not checking urine microalbumin with chronic angiotensin receptor blocker use #2 hypertension. Stable. Continue current medications. Recommend weight loss. #3 history of mild hypokalemia on thiazide. Recheck basic metabolic panel

## 2013-05-14 NOTE — Telephone Encounter (Signed)
Relevant patient education mailed to patient.  

## 2013-05-14 NOTE — Progress Notes (Signed)
Pre visit review using our clinic review tool, if applicable. No additional management support is needed unless otherwise documented below in the visit note. 

## 2013-05-23 ENCOUNTER — Telehealth: Payer: Self-pay

## 2013-05-23 NOTE — Telephone Encounter (Signed)
Relevant patient education mailed to patient.  

## 2013-06-04 ENCOUNTER — Other Ambulatory Visit: Payer: Self-pay | Admitting: Family Medicine

## 2013-07-03 ENCOUNTER — Other Ambulatory Visit: Payer: Self-pay | Admitting: Family Medicine

## 2013-10-14 LAB — HM DIABETES EYE EXAM

## 2013-11-12 ENCOUNTER — Ambulatory Visit (INDEPENDENT_AMBULATORY_CARE_PROVIDER_SITE_OTHER): Payer: No Typology Code available for payment source | Admitting: Family Medicine

## 2013-11-12 ENCOUNTER — Encounter: Payer: Self-pay | Admitting: Family Medicine

## 2013-11-12 VITALS — BP 140/70 | HR 90 | Wt 192.0 lb

## 2013-11-12 DIAGNOSIS — E785 Hyperlipidemia, unspecified: Secondary | ICD-10-CM

## 2013-11-12 DIAGNOSIS — I10 Essential (primary) hypertension: Secondary | ICD-10-CM

## 2013-11-12 DIAGNOSIS — E119 Type 2 diabetes mellitus without complications: Secondary | ICD-10-CM

## 2013-11-12 LAB — HEPATIC FUNCTION PANEL
ALT: 14 U/L (ref 0–35)
AST: 17 U/L (ref 0–37)
Albumin: 3.9 g/dL (ref 3.5–5.2)
Alkaline Phosphatase: 84 U/L (ref 39–117)
Bilirubin, Direct: 0.2 mg/dL (ref 0.0–0.3)
TOTAL PROTEIN: 7.4 g/dL (ref 6.0–8.3)
Total Bilirubin: 1.2 mg/dL (ref 0.2–1.2)

## 2013-11-12 LAB — HEMOGLOBIN A1C: Hgb A1c MFr Bld: 7.7 % — ABNORMAL HIGH (ref 4.6–6.5)

## 2013-11-12 LAB — LIPID PANEL
CHOL/HDL RATIO: 2
Cholesterol: 124 mg/dL (ref 0–200)
HDL: 59.9 mg/dL (ref >39.00)
LDL CALC: 51 mg/dL (ref 0–99)
NONHDL: 64.1
TRIGLYCERIDES: 67 mg/dL (ref 0.0–149.0)
VLDL: 13.4 mg/dL (ref 0.0–40.0)

## 2013-11-12 LAB — HM DIABETES FOOT EXAM: HM Diabetic Foot Exam: NORMAL

## 2013-11-12 LAB — BASIC METABOLIC PANEL
BUN: 12 mg/dL (ref 6–23)
CO2: 29 mEq/L (ref 19–32)
Calcium: 9.2 mg/dL (ref 8.4–10.5)
Chloride: 105 mEq/L (ref 96–112)
Creatinine, Ser: 0.7 mg/dL (ref 0.4–1.2)
GFR: 111.48 mL/min (ref >60.00)
Glucose, Bld: 143 mg/dL — ABNORMAL HIGH (ref 70–99)
POTASSIUM: 3.5 meq/L (ref 3.5–5.1)
Sodium: 139 mEq/L (ref 135–145)

## 2013-11-12 NOTE — Progress Notes (Signed)
Pre visit review using our clinic review tool, if applicable. No additional management support is needed unless otherwise documented below in the visit note. 

## 2013-11-12 NOTE — Progress Notes (Signed)
   Subjective:    Patient ID: [name] [name], female    DOB: 1934-05-12, 79 y.o.   MRN: [id number]  HPI Patient is here for medical followup. She has history of obesity, type 2 diabetes, hypertension, hyperlipidemia. Most recent A1c 7.6%. She's lost 4 pounds since last visit due to being more active. No chest pains. No edema issues. Does not monitor blood sugars regularly. No hypoglycemia.  She had recent [facility] screening and screening test were all basically normal. Medications reviewed. Compliant with all. Denies any side effects. She declines flu vaccine. She had eye exam recently back in August. No history of neuropathy or retinopathy.  Past Medical History  Diagnosis Date  . Arthritis   . Diabetes mellitus   . Hyperlipidemia   . Hypertension   . Chronic kidney disease     stones  . UTI (urinary tract infection)    Past Surgical History  Procedure Laterality Date  . Cholecystectomy  1989  . Tonsillectomy  1953  . Abdominal hysterectomy  1980    reports that she has never smoked. She does not have any smokeless tobacco history on file. Her alcohol and drug histories are not on file. family history includes Cancer in her mother; Diabetes in her brother and sister; Heart disease (age of onset: 74) in her father. Allergies  Allergen Reactions  . Actos [Pioglitazone Hydrochloride]   . Lisinopril   . Penicillins   . Zocor [Simvastatin - High Dose]     itiching      Review of Systems  Constitutional: Negative for fatigue.  Eyes: Negative for visual disturbance.  Respiratory: Negative for cough, chest tightness, shortness of breath and wheezing.   Cardiovascular: Negative for chest pain, palpitations and leg swelling.  Endocrine: Negative for polydipsia and polyuria.  Neurological: Negative for dizziness, seizures, syncope, weakness, light-headedness and headaches.       Objective:   Physical Exam  Constitutional: She appears well-developed and well-nourished. No  distress.  Neck: Neck supple. No thyromegaly present.  Cardiovascular: Normal rate and regular rhythm.   Pulmonary/Chest: Effort normal and breath sounds normal. No respiratory distress. She has no wheezes. She has no rales.  Musculoskeletal: She exhibits no edema.  Skin:  Feet reveal no skin lesions. Good distal foot pulses. Good capillary refill. No calluses. Normal sensation with monofilament testing           Assessment & Plan:  #1 type 2 diabetes. History fair control. Recheck A1c. Continue weight loss efforts. Continue yearly eye exam.  #2 hypertension stable. Continue close monitoring. She did not take her blood pressure medication this morning which may account for slight elevation #3 dyslipidemia. Continue atorvastatin. Check lipid and hepatic panel #4 health maintenance. Flu vaccine recommended and declined.

## 2013-12-03 ENCOUNTER — Other Ambulatory Visit: Payer: Self-pay | Admitting: Family Medicine

## 2014-01-08 ENCOUNTER — Encounter (INDEPENDENT_AMBULATORY_CARE_PROVIDER_SITE_OTHER): Payer: Medicare (Managed Care) | Admitting: Ophthalmology

## 2014-01-08 DIAGNOSIS — E11319 Type 2 diabetes mellitus with unspecified diabetic retinopathy without macular edema: Secondary | ICD-10-CM

## 2014-01-08 DIAGNOSIS — H35033 Hypertensive retinopathy, bilateral: Secondary | ICD-10-CM

## 2014-01-08 DIAGNOSIS — H43813 Vitreous degeneration, bilateral: Secondary | ICD-10-CM

## 2014-01-08 DIAGNOSIS — E11329 Type 2 diabetes mellitus with mild nonproliferative diabetic retinopathy without macular edema: Secondary | ICD-10-CM

## 2014-01-08 DIAGNOSIS — I10 Essential (primary) hypertension: Secondary | ICD-10-CM

## 2014-03-17 ENCOUNTER — Other Ambulatory Visit: Payer: Self-pay | Admitting: Family Medicine

## 2014-04-07 ENCOUNTER — Other Ambulatory Visit: Payer: Self-pay | Admitting: Family Medicine

## 2014-05-19 ENCOUNTER — Encounter: Payer: Self-pay | Admitting: Family Medicine

## 2014-05-19 ENCOUNTER — Ambulatory Visit (INDEPENDENT_AMBULATORY_CARE_PROVIDER_SITE_OTHER): Payer: No Typology Code available for payment source | Admitting: Family Medicine

## 2014-05-19 VITALS — BP 130/84 | HR 91 | Temp 98.3°F | Ht 61.5 in | Wt 192.0 lb

## 2014-05-19 DIAGNOSIS — L659 Nonscarring hair loss, unspecified: Secondary | ICD-10-CM

## 2014-05-19 DIAGNOSIS — E1165 Type 2 diabetes mellitus with hyperglycemia: Secondary | ICD-10-CM | POA: Diagnosis not present

## 2014-05-19 DIAGNOSIS — I10 Essential (primary) hypertension: Secondary | ICD-10-CM

## 2014-05-19 DIAGNOSIS — IMO0002 Reserved for concepts with insufficient information to code with codable children: Secondary | ICD-10-CM | POA: Insufficient documentation

## 2014-05-19 LAB — BASIC METABOLIC PANEL
BUN: 13 mg/dL (ref 6–23)
CALCIUM: 9.6 mg/dL (ref 8.4–10.5)
CO2: 28 mEq/L (ref 19–32)
Chloride: 102 mEq/L (ref 96–112)
Creatinine, Ser: 0.72 mg/dL (ref 0.40–1.20)
GFR: 100.69 mL/min (ref >60.00)
GLUCOSE: 141 mg/dL — AB (ref 70–99)
Potassium: 3.6 mEq/L (ref 3.5–5.1)
Sodium: 138 mEq/L (ref 135–145)

## 2014-05-19 LAB — HEMOGLOBIN A1C: HEMOGLOBIN A1C: 7.6 % — AB (ref 4.6–6.5)

## 2014-05-19 LAB — TSH: TSH: 4.19 u[IU]/mL (ref 0.35–4.50)

## 2014-05-19 NOTE — Progress Notes (Signed)
   Subjective:    Patient ID: [name] [name], female    DOB: [birth year], 80 y.o.   MRN: [id number]  HPI Patient seen for medical follow-up. She has history of obesity, hypertension, hyperlipidemia, type 2 diabetes. She's had some gradual hair loss and is concerned this may be secondary to metformin. She states her blood sugars fasting range between 90 and 150. Last A1c 7.7%. She also remains on Amaryl. She is on losartan HCTZ for hypertension. Her weight is exactly the same as last visit. Lipids were checked last fall and stable. No recent chest pains or dyspnea.  Past Medical History  Diagnosis Date  . Arthritis   . Diabetes mellitus   . Hyperlipidemia   . Hypertension   . Chronic kidney disease     stones  . UTI (urinary tract infection)    Past Surgical History  Procedure Laterality Date  . Cholecystectomy  1989  . Tonsillectomy  1953  . Abdominal hysterectomy  1980    reports that she has never smoked. She does not have any smokeless tobacco history on file. Her alcohol and drug histories are not on file. family history includes Cancer in her mother; Diabetes in her brother and sister; Heart disease (age of onset: 61) in her father. Allergies  Allergen Reactions  . Actos [Pioglitazone Hydrochloride]   . Lisinopril   . Penicillins   . Zocor [Simvastatin - High Dose]     itiching      Review of Systems  Constitutional: Negative for fatigue.  Eyes: Negative for visual disturbance.  Respiratory: Negative for cough, chest tightness, shortness of breath and wheezing.   Cardiovascular: Negative for chest pain, palpitations and leg swelling.  Neurological: Negative for dizziness, seizures, syncope, weakness, light-headedness and headaches.       Objective:   Physical Exam  Constitutional: She appears well-developed and well-nourished. No distress.  Neck: Neck supple. No thyromegaly present.  Cardiovascular: Regular rhythm.   Pulmonary/Chest: Effort normal and breath  sounds normal. No respiratory distress. She has no wheezes. She has no rales.  Lymphadenopathy:    She has no cervical adenopathy.  Neurological: She is alert.          Assessment & Plan:  #1 type 2 diabetes. History of fair control. Recheck A1c. At her age her goals less than 8. #2 hypertension which is stable and at goal. Continue losartan HCTZ. Check basic metabolic panel  #3 general mild alopecia. Patient concerned about possibly related to metformin. Check TSH.

## 2014-05-19 NOTE — Progress Notes (Signed)
Pre visit review using our clinic review tool, if applicable. No additional management support is needed unless otherwise documented below in the visit note. 

## 2014-05-23 ENCOUNTER — Other Ambulatory Visit: Payer: Self-pay | Admitting: Family Medicine

## 2014-06-05 ENCOUNTER — Other Ambulatory Visit: Payer: Self-pay | Admitting: Family Medicine

## 2014-06-18 ENCOUNTER — Other Ambulatory Visit: Payer: Self-pay | Admitting: Family Medicine

## 2014-07-16 ENCOUNTER — Other Ambulatory Visit: Payer: Self-pay | Admitting: Family Medicine

## 2014-08-29 ENCOUNTER — Other Ambulatory Visit: Payer: Self-pay | Admitting: Family Medicine

## 2014-09-11 LAB — HM DIABETES EYE EXAM

## 2014-09-12 ENCOUNTER — Encounter: Payer: Self-pay | Admitting: Family Medicine

## 2014-09-13 ENCOUNTER — Other Ambulatory Visit: Payer: Self-pay | Admitting: Family Medicine

## 2014-09-22 ENCOUNTER — Other Ambulatory Visit: Payer: Self-pay | Admitting: Family Medicine

## 2014-11-11 ENCOUNTER — Other Ambulatory Visit: Payer: Self-pay | Admitting: Family Medicine

## 2014-11-19 ENCOUNTER — Ambulatory Visit (INDEPENDENT_AMBULATORY_CARE_PROVIDER_SITE_OTHER): Payer: No Typology Code available for payment source | Admitting: Family Medicine

## 2014-11-19 VITALS — BP 132/74 | HR 88 | Temp 98.4°F | Ht 61.5 in | Wt 192.6 lb

## 2014-11-19 DIAGNOSIS — I10 Essential (primary) hypertension: Secondary | ICD-10-CM | POA: Diagnosis not present

## 2014-11-19 DIAGNOSIS — R252 Cramp and spasm: Secondary | ICD-10-CM | POA: Diagnosis not present

## 2014-11-19 DIAGNOSIS — E1165 Type 2 diabetes mellitus with hyperglycemia: Secondary | ICD-10-CM

## 2014-11-19 DIAGNOSIS — H409 Unspecified glaucoma: Secondary | ICD-10-CM | POA: Diagnosis not present

## 2014-11-19 DIAGNOSIS — IMO0002 Reserved for concepts with insufficient information to code with codable children: Secondary | ICD-10-CM

## 2014-11-19 LAB — HEMOGLOBIN A1C: Hgb A1c MFr Bld: 7.6 % — ABNORMAL HIGH (ref 4.6–6.5)

## 2014-11-19 LAB — BASIC METABOLIC PANEL
BUN: 13 mg/dL (ref 6–23)
CHLORIDE: 103 meq/L (ref 96–112)
CO2: 30 meq/L (ref 19–32)
CREATININE: 0.77 mg/dL (ref 0.40–1.20)
Calcium: 9.3 mg/dL (ref 8.4–10.5)
GFR: 93.06 mL/min (ref >60.00)
Glucose, Bld: 214 mg/dL — ABNORMAL HIGH (ref 70–99)
Potassium: 4.4 mEq/L (ref 3.5–5.1)
Sodium: 138 mEq/L (ref 135–145)

## 2014-11-19 NOTE — Patient Instructions (Signed)

## 2014-11-19 NOTE — Progress Notes (Signed)
Pre visit review using our clinic review tool, if applicable. No additional management support is needed unless otherwise documented below in the visit note. 

## 2014-11-19 NOTE — Progress Notes (Signed)
   Subjective:    Patient ID: [name] [name], female    DOB: 06/13/1934, 80 y.o.   MRN: [id number]  HPI Type 2 diabetes. Last A1c 7.6%. We increased her metformin. Denies medication side effects. No recent hypoglycemia. Also takes Amaryl. Followed regularly by ophthalmology.  Hypertension treated with losartan HCTZ. No dizziness. No peripheral edema. No recent chest pains.  Recent occasional leg cramps left leg. Worse at night. She has increased her fluid intake. She took some vinegar which seemed to relieve her cramps. Denies any claudication symptoms. No radiculopathy symptoms.  Past Medical History  Diagnosis Date  . Arthritis   . Diabetes mellitus   . Hyperlipidemia   . Hypertension   . Chronic kidney disease     stones  . UTI (urinary tract infection)    Past Surgical History  Procedure Laterality Date  . Cholecystectomy  1989  . Tonsillectomy  1953  . Abdominal hysterectomy  1980    reports that she has never smoked. She does not have any smokeless tobacco history on file. Her alcohol and drug histories are not on file. family history includes Cancer in her mother; Diabetes in her brother and sister; Heart disease (age of onset: 58) in her father. Allergies  Allergen Reactions  . Actos [Pioglitazone Hydrochloride]   . Lisinopril   . Penicillins   . Zocor [Simvastatin - High Dose]     itiching      Review of Systems  Constitutional: Negative for fatigue and unexpected weight change.  Eyes: Negative for visual disturbance.  Respiratory: Negative for cough, chest tightness, shortness of breath and wheezing.   Cardiovascular: Negative for chest pain, palpitations and leg swelling.  Endocrine: Negative for polydipsia and polyuria.  Genitourinary: Negative for dysuria.  Neurological: Negative for dizziness, seizures, syncope, weakness, light-headedness and headaches.       Objective:   Physical Exam  Constitutional: She appears well-developed and well-nourished.  No distress.  Neck: Neck supple. No thyromegaly present.  Cardiovascular: Normal rate and regular rhythm.   Both feet are warm to touch with excellent distal pulses.  Pulmonary/Chest: Effort normal and breath sounds normal. No respiratory distress. She has no wheezes. She has no rales.  Musculoskeletal: She exhibits no edema.  Lymphadenopathy:    She has no cervical adenopathy.  Skin:  Feet reveal no skin lesions. Good distal foot pulses. Good capillary refill. No calluses. Normal sensation with monofilament testing           Assessment & Plan:  #1 type 2 diabetes. Fair control. Recheck A1c. Continue weight loss efforts. Routine follow-up 6 months #2 hyperlipidemia. Stable on Lipitor. Recheck lipids at follow-up  #3 hypertension stable and at goal. Continue current medications. Check basic metabolic panel #4 leg cramps. Increase hydration. Check electrolytes as above. #5 health maintenance. Recommended flu vaccine as well as Prevnar 13 and she declines both.

## 2014-11-20 ENCOUNTER — Other Ambulatory Visit: Payer: Self-pay | Admitting: Family Medicine

## 2014-11-20 DIAGNOSIS — R7309 Other abnormal glucose: Secondary | ICD-10-CM

## 2014-11-20 NOTE — Addendum Note (Signed)
Addended by: Ailene Rud E on: 11/20/2014 11:41 AM   Modules accepted: Orders

## 2014-11-22 ENCOUNTER — Other Ambulatory Visit: Payer: Self-pay | Admitting: Family Medicine

## 2014-12-18 ENCOUNTER — Other Ambulatory Visit: Payer: Self-pay | Admitting: Family Medicine

## 2015-02-14 ENCOUNTER — Other Ambulatory Visit: Payer: Self-pay | Admitting: Family Medicine

## 2015-02-22 DIAGNOSIS — Z923 Personal history of irradiation: Secondary | ICD-10-CM

## 2015-02-22 DIAGNOSIS — C50919 Malignant neoplasm of unspecified site of unspecified female breast: Secondary | ICD-10-CM

## 2015-02-22 HISTORY — DX: Malignant neoplasm of unspecified site of unspecified female breast: C50.919

## 2015-02-22 HISTORY — PX: BREAST LUMPECTOMY: SHX2

## 2015-02-22 HISTORY — DX: Personal history of irradiation: Z92.3

## 2015-03-20 ENCOUNTER — Other Ambulatory Visit: Payer: Self-pay | Admitting: Family Medicine

## 2015-04-16 ENCOUNTER — Other Ambulatory Visit: Payer: Self-pay | Admitting: Family Medicine

## 2015-04-29 LAB — HM DIABETES EYE EXAM

## 2015-04-30 ENCOUNTER — Encounter: Payer: Self-pay | Admitting: Family Medicine

## 2015-05-15 ENCOUNTER — Other Ambulatory Visit: Payer: Self-pay | Admitting: Family Medicine

## 2015-05-20 ENCOUNTER — Ambulatory Visit (INDEPENDENT_AMBULATORY_CARE_PROVIDER_SITE_OTHER): Payer: No Typology Code available for payment source | Admitting: Family Medicine

## 2015-05-20 ENCOUNTER — Other Ambulatory Visit: Payer: Self-pay | Admitting: Family Medicine

## 2015-05-20 DIAGNOSIS — N6310 Unspecified lump in the right breast, unspecified quadrant: Secondary | ICD-10-CM

## 2015-05-20 DIAGNOSIS — N63 Unspecified lump in breast: Secondary | ICD-10-CM | POA: Diagnosis not present

## 2015-05-20 DIAGNOSIS — E1165 Type 2 diabetes mellitus with hyperglycemia: Secondary | ICD-10-CM | POA: Diagnosis not present

## 2015-05-20 DIAGNOSIS — I10 Essential (primary) hypertension: Secondary | ICD-10-CM

## 2015-05-20 DIAGNOSIS — E785 Hyperlipidemia, unspecified: Secondary | ICD-10-CM

## 2015-05-20 LAB — HEPATIC FUNCTION PANEL
ALBUMIN: 3.9 g/dL (ref 3.5–5.2)
ALK PHOS: 74 U/L (ref 39–117)
ALT: 13 U/L (ref 0–35)
AST: 13 U/L (ref 0–37)
BILIRUBIN DIRECT: 0.2 mg/dL (ref 0.0–0.3)
BILIRUBIN TOTAL: 0.8 mg/dL (ref 0.2–1.2)
Total Protein: 6.5 g/dL (ref 6.0–8.3)

## 2015-05-20 LAB — LIPID PANEL
Cholesterol: 121 mg/dL (ref 0–200)
HDL: 62.1 mg/dL (ref >39.00)
LDL Cholesterol: 45 mg/dL (ref 0–99)
NONHDL: 58.44
Total CHOL/HDL Ratio: 2
Triglycerides: 67 mg/dL (ref 0.0–149.0)
VLDL: 13.4 mg/dL (ref 0.0–40.0)

## 2015-05-20 LAB — BASIC METABOLIC PANEL
BUN: 11 mg/dL (ref 6–23)
CALCIUM: 9.2 mg/dL (ref 8.4–10.5)
CO2: 26 mEq/L (ref 19–32)
CREATININE: 0.77 mg/dL (ref 0.40–1.20)
Chloride: 104 mEq/L (ref 96–112)
GFR: 92.95 mL/min (ref >60.00)
GLUCOSE: 281 mg/dL — AB (ref 70–99)
Potassium: 3.6 mEq/L (ref 3.5–5.1)
SODIUM: 139 meq/L (ref 135–145)

## 2015-05-20 LAB — HM DIABETES FOOT EXAM
HM DIABETIC FOOT EXAM: NORMAL
HM Diabetic Foot Exam: NORMAL

## 2015-05-20 LAB — HEMOGLOBIN A1C: Hgb A1c MFr Bld: 7.8 % — ABNORMAL HIGH (ref 4.6–6.5)

## 2015-05-20 MED ORDER — ATORVASTATIN CALCIUM 20 MG PO TABS: 20.0000 mg | ORAL_TABLET | Freq: Every day | ORAL | Status: DC

## 2015-05-20 MED ORDER — LOSARTAN POTASSIUM-HCTZ 50-12.5 MG PO TABS: 1.0000 | ORAL_TABLET | Freq: Every day | ORAL | Status: DC

## 2015-05-20 MED ORDER — GLIMEPIRIDE 4 MG PO TABS: 4.0000 mg | ORAL_TABLET | Freq: Every morning | ORAL | Status: DC

## 2015-05-20 MED ORDER — METFORMIN HCL 500 MG PO TABS: ORAL_TABLET | ORAL | Status: DC

## 2015-05-20 NOTE — Progress Notes (Signed)
   Subjective:    Patient ID: [name] [name], female    DOB: 1934/11/05, 81 y.o.   MRN: [id number]  HPI Patient here for routine medical follow-up. She has history of obesity, type 2 diabetes, dyslipidemia, hypertension, past history of kidney stones. She also has glaucoma and is followed regularly by ophthalmology.  Recent fasting blood sugars ranging around 140. Last A1c 7.6%. Medications reviewed. Compliant with all. No recent chest pains. No dizziness. No dyspnea.  Patient was bowling couple weeks ago and noticed firmness in her right breast around 11:00 position. She's not had any nipple discharge or skin retraction. No adenopathy. She has not had previous screening mammography.  Past Medical History  Diagnosis Date  . Arthritis   . Diabetes mellitus   . Hyperlipidemia   . Hypertension   . Chronic kidney disease     stones  . UTI (urinary tract infection)    Past Surgical History  Procedure Laterality Date  . Cholecystectomy  1989  . Tonsillectomy  1953  . Abdominal hysterectomy  1980    reports that she has never smoked. She does not have any smokeless tobacco history on file. Her alcohol and drug histories are not on file. family history includes Cancer in her mother; Diabetes in her brother and sister; Heart disease (age of onset: 83) in her father. Allergies  Allergen Reactions  . Actos [Pioglitazone Hydrochloride]   . Lisinopril   . Penicillins   . Zocor [Simvastatin - High Dose]     itiching      Review of Systems  Constitutional: Negative for fatigue.  Eyes: Negative for visual disturbance.  Respiratory: Negative for cough, chest tightness, shortness of breath and wheezing.   Cardiovascular: Negative for chest pain, palpitations and leg swelling.  Endocrine: Negative for polydipsia and polyuria.  Neurological: Negative for dizziness, seizures, syncope, weakness, light-headedness and headaches.       Objective:   Physical Exam  Constitutional: She  appears well-developed and well-nourished.  Eyes: Pupils are equal, round, and reactive to light.  Neck: Neck supple. No JVD present. No thyromegaly present.  Cardiovascular: Normal rate and regular rhythm.  Exam reveals no gallop.   Pulmonary/Chest: Effort normal and breath sounds normal. No respiratory distress. She has no wheezes. She has no rales.  Genitourinary:  Right breast mass palpated 11 O'clock position.  Non-tender .  No adenopathy noted .   No nipple d/c.  No skin retraction.    Musculoskeletal: She exhibits no edema.  Neurological: She is alert.  Skin:  She has very long toenails that are in need of trimming. Otherwise foot exam is unremarkable. Monofilament testing is normal. No calluses or ulceration. Good foot pulses.          Assessment & Plan:  #1 type 2 diabetes. History of fair control. Recheck A1c. Patient is reluctant to consider additional medication this time. Podiatry follow up recommended.  #2 hypertension stable and at goal. Check basic metabolic panel  #3 hyperlipidemia. Check lipid and hepatic panel. Refill atorvastatin for one year  #4 right breast mass. She has a definite firmness to palpation on the lateral 11 o'clock position of the right breast and needs further evaluation. Set up bilateral diagnostic mammogram along with ultrasound of right breast.

## 2015-05-20 NOTE — Progress Notes (Signed)
Pre visit review using our clinic review tool, if applicable. No additional management support is needed unless otherwise documented below in the visit note. 

## 2015-05-20 NOTE — Patient Instructions (Signed)
Be sure to go for diagnostic mammogram and ultrasound of right breast

## 2015-05-26 ENCOUNTER — Ambulatory Visit
Admission: RE | Admit: 2015-05-26 | Discharge: 2015-05-26 | Disposition: A | Discharge status: Home / Self Care | Payer: No Typology Code available for payment source | Source: Ambulatory Visit | Attending: Family Medicine | Admitting: Family Medicine

## 2015-05-26 ENCOUNTER — Other Ambulatory Visit: Payer: Self-pay | Admitting: Family Medicine

## 2015-05-26 DIAGNOSIS — N6310 Unspecified lump in the right breast, unspecified quadrant: Secondary | ICD-10-CM

## 2015-06-02 ENCOUNTER — Ambulatory Visit
Admission: RE | Admit: 2015-06-02 | Discharge: 2015-06-02 | Disposition: A | Discharge status: Home / Self Care | Payer: No Typology Code available for payment source | Source: Ambulatory Visit | Attending: Family Medicine | Admitting: Family Medicine

## 2015-06-02 ENCOUNTER — Other Ambulatory Visit: Payer: Self-pay | Admitting: Family Medicine

## 2015-06-02 DIAGNOSIS — N6310 Unspecified lump in the right breast, unspecified quadrant: Secondary | ICD-10-CM

## 2015-06-04 ENCOUNTER — Encounter: Payer: Self-pay | Admitting: *Deleted

## 2015-06-04 ENCOUNTER — Telehealth: Payer: Self-pay | Admitting: *Deleted

## 2015-06-04 DIAGNOSIS — C50411 Malignant neoplasm of upper-outer quadrant of right female breast: Secondary | ICD-10-CM

## 2015-06-04 HISTORY — DX: Malignant neoplasm of upper-outer quadrant of right female breast: C50.411

## 2015-06-04 NOTE — Telephone Encounter (Signed)
Left vm for pt to return call regarding [place] appt for 06/10/15. Contact information provided.

## 2015-06-05 ENCOUNTER — Telehealth: Payer: Self-pay | Admitting: *Deleted

## 2015-06-05 NOTE — Telephone Encounter (Signed)
Mailed clinic packet to pt.  

## 2015-06-05 NOTE — Telephone Encounter (Signed)
Confirmed [facility] for 06/10/15 at 1215.  Instructions and contact information given.

## 2015-06-10 ENCOUNTER — Encounter: Payer: Self-pay | Admitting: Nurse Practitioner

## 2015-06-10 ENCOUNTER — Encounter: Payer: Self-pay | Admitting: Physical Therapy

## 2015-06-10 ENCOUNTER — Ambulatory Visit (HOSPITAL_BASED_OUTPATIENT_CLINIC_OR_DEPARTMENT_OTHER): Payer: No Typology Code available for payment source | Admitting: Hematology and Oncology

## 2015-06-10 ENCOUNTER — Ambulatory Visit
Admission: RE | Admit: 2015-06-10 | Discharge: 2015-06-10 | Disposition: A | Discharge status: Home / Self Care | Payer: No Typology Code available for payment source | Source: Ambulatory Visit | Attending: Radiation Oncology | Admitting: Radiation Oncology

## 2015-06-10 ENCOUNTER — Ambulatory Visit: Payer: Self-pay | Admitting: Surgery

## 2015-06-10 ENCOUNTER — Encounter: Payer: Self-pay | Admitting: Hematology and Oncology

## 2015-06-10 ENCOUNTER — Other Ambulatory Visit (HOSPITAL_BASED_OUTPATIENT_CLINIC_OR_DEPARTMENT_OTHER): Payer: No Typology Code available for payment source

## 2015-06-10 ENCOUNTER — Ambulatory Visit: Payer: No Typology Code available for payment source | Attending: Surgery | Admitting: Physical Therapy

## 2015-06-10 VITALS — BP 160/59 | HR 98 | Temp 98.3°F | Resp 19 | Ht 61.5 in | Wt 196.0 lb

## 2015-06-10 DIAGNOSIS — C50411 Malignant neoplasm of upper-outer quadrant of right female breast: Secondary | ICD-10-CM | POA: Diagnosis present

## 2015-06-10 DIAGNOSIS — Z808 Family history of malignant neoplasm of other organs or systems: Secondary | ICD-10-CM | POA: Diagnosis not present

## 2015-06-10 DIAGNOSIS — Z170 Estrogen receptor positive status [ER+]: Secondary | ICD-10-CM | POA: Diagnosis not present

## 2015-06-10 DIAGNOSIS — C50111 Malignant neoplasm of central portion of right female breast: Secondary | ICD-10-CM

## 2015-06-10 DIAGNOSIS — R293 Abnormal posture: Secondary | ICD-10-CM | POA: Diagnosis present

## 2015-06-10 LAB — CBC WITH DIFFERENTIAL/PLATELET
BASO%: 0.6 % (ref 0.0–2.0)
Basophils Absolute: 0 10*3/uL (ref 0.0–0.1)
EOS ABS: 0.1 10*3/uL (ref 0.0–0.5)
EOS%: 1.5 % (ref 0.0–7.0)
HCT: 39.1 % (ref 34.8–46.6)
HGB: 12.7 g/dL (ref 11.6–15.9)
LYMPH%: 20 % (ref 14.0–49.7)
MCH: 30.4 pg (ref 25.1–34.0)
MCHC: 32.3 g/dL (ref 31.5–36.0)
MCV: 94.1 fL (ref 79.5–101.0)
MONO#: 0.4 10*3/uL (ref 0.1–0.9)
MONO%: 7 % (ref 0.0–14.0)
NEUT%: 70.9 % (ref 38.4–76.8)
NEUTROS ABS: 4 10*3/uL (ref 1.5–6.5)
Platelets: 184 10*3/uL (ref 145–400)
RBC: 4.16 10*6/uL (ref 3.70–5.45)
RDW: 14.9 % — ABNORMAL HIGH (ref 11.2–14.5)
WBC: 5.6 10*3/uL (ref 3.9–10.3)
lymph#: 1.1 10*3/uL (ref 0.9–3.3)

## 2015-06-10 LAB — COMPREHENSIVE METABOLIC PANEL
ALT: 12 U/L (ref 0–55)
AST: 11 U/L (ref 5–34)
Albumin: 3.6 g/dL (ref 3.5–5.0)
Alkaline Phosphatase: 83 U/L (ref 40–150)
Anion Gap: 10 mEq/L (ref 3–11)
BILIRUBIN TOTAL: 0.91 mg/dL (ref 0.20–1.20)
BUN: 15.4 mg/dL (ref 7.0–26.0)
CO2: 25 meq/L (ref 22–29)
Calcium: 9.3 mg/dL (ref 8.4–10.4)
Chloride: 107 mEq/L (ref 98–109)
Creatinine: 0.9 mg/dL (ref 0.6–1.1)
EGFR: 71 mL/min/{1.73_m2} — AB (ref >90)
GLUCOSE: 241 mg/dL — AB (ref 70–140)
POTASSIUM: 4 meq/L (ref 3.5–5.1)
SODIUM: 142 meq/L (ref 136–145)
TOTAL PROTEIN: 7.1 g/dL (ref 6.4–8.3)

## 2015-06-10 NOTE — Assessment & Plan Note (Signed)
Right breast biopsy 10:00 position 06/02/2015: Invasive lobular cancer with LCIS, ER 95%, PR 80%, Ki-67 15%, HER-2 negative duration 1.61, ultrasound revealed a 1.9 x 1 x 1.4 cm lesion, T1 cN0 stage IA clinical stage  Pathology and radiology counseling:Discussed with the patient, the details of pathology including the type of breast cancer,the clinical staging, the significance of ER, PR and HER-2/neu receptors and the implications for treatment. After reviewing the pathology in detail, we proceeded to discuss the different treatment options between surgery, radiation, and antiestrogen therapies.  Recommendations: 1. Breast conserving surgery followed by 2. Adjuvant radiation therapy followed by 4. Adjuvant antiestrogen therapy  I discussed with the patient that I would not send Oncotype DX testing mainly because of her elderly age as well as the fact that this is a lobular cancer with ER/PR positivity. Return to clinic after surgery to discuss final pathology report.

## 2015-06-10 NOTE — Progress Notes (Signed)
[facility] CONSULT NOTE  Patient Care Team: Eulas Post, MD as PCP - General (Family Medicine)  CHIEF COMPLAINTS/PURPOSE OF CONSULTATION:  Newly diagnosed breast cancer  HISTORY OF PRESENTING ILLNESS:  [name] [name] 81 y.o. female is here because of recent diagnosis of right breast cancer. Patient had a palpable right breast lump which was evaluated by mammogram and ultrasound. The ultrasound revealed that the tumor appears to be 1.9 x 1 x 1.4 cm in size. Ultrasound-guided biopsy was performed which came back as invasive lobular cancer grade 2 ER PR positive HER-2 negative get the case 7:15 percent. This was under the multidisciplinary tumor board and she is here today to discuss treatment plan.  I reviewed her records extensively and collaborated the history with the patient.  SUMMARY OF ONCOLOGIC HISTORY:   Breast cancer of upper-outer quadrant of right female breast ([place])   06/02/2015 Initial Diagnosis Right breast biopsy 10:00 position: Invasive lobular cancer with LCIS, ER 95%, PR 80%, Ki-67 15%, HER-2 negative duration 1.61, ultrasound revealed a 1.9 x 1 x 1.4 cm lesion, T1 cN0 stage IA clinical stage    MEDICAL HISTORY:  Past Medical History  Diagnosis Date  . Arthritis   . Diabetes mellitus   . Hyperlipidemia   . Hypertension   . Chronic kidney disease     stones  . UTI (urinary tract infection)   . Breast cancer of upper-outer quadrant of right female breast ([place]) 06/04/2015    SURGICAL HISTORY: Past Surgical History  Procedure Laterality Date  . Cholecystectomy  1989  . Tonsillectomy  1953  . Abdominal hysterectomy  1980  . Cataract extraction      SOCIAL HISTORY: Social History   Social History  . Marital Status: Widowed    Spouse Name: N/A  . Number of Children: N/A  . Years of Education: N/A   Occupational History  . Not on file.   Social History Main Topics  . Smoking status: Never Smoker   . Smokeless tobacco: Not on file   . Alcohol Use: No  . Drug Use: No  . Sexual Activity: Not on file   Other Topics Concern  . Not on file   Social History Narrative    FAMILY HISTORY: Family History  Problem Relation Age of Onset  . Cancer Mother     uterine  . Heart disease Father 1  . Diabetes Sister   . Diabetes Brother     ALLERGIES:  is allergic to percocet; actos; lisinopril; penicillins; and zocor.  MEDICATIONS:  Current Outpatient Prescriptions  Medication Sig Dispense Refill  . aspirin 81 MG tablet Take 81 mg by mouth daily.      Marland Kitchen atorvastatin (LIPITOR) 20 MG tablet Take 1 tablet (20 mg total) by mouth daily. 90 tablet 3  . glimepiride (AMARYL) 4 MG tablet Take 1 tablet (4 mg total) by mouth every morning. 90 tablet 3  . Glucosamine-Chondroitin (OSTEO BI-FLEX REGULAR STRENGTH PO) Take by mouth.    . latanoprost (XALATAN) 0.005 % ophthalmic solution Place 1 drop into both eyes at bedtime.  4  . losartan-hydrochlorothiazide (HYZAAR) 50-12.5 MG tablet Take 1 tablet by mouth daily. 90 tablet 3  . metFORMIN (GLUCOPHAGE) 500 MG tablet Take 2 tablets each morning and one at night. 270 tablet 3   No current facility-administered medications for this visit.    REVIEW OF SYSTEMS:   Constitutional: Denies fevers, chills or abnormal night sweats Eyes: Denies blurriness of vision, double vision or watery  eyes Ears, nose, mouth, throat, and face: Denies mucositis or sore throat Respiratory: Denies cough, dyspnea or wheezes Cardiovascular: Denies palpitation, chest discomfort or lower extremity swelling Gastrointestinal:  Denies nausea, heartburn or change in bowel habits Skin: Denies abnormal skin rashes Lymphatics: Denies new lymphadenopathy or easy bruising Neurological:Denies numbness, tingling or new weaknesses Behavioral/Psych: Mood is stable, no new changes  Breast:  Denies any palpable lumps or discharge All other systems were reviewed with the patient and are negative.  PHYSICAL  EXAMINATION: ECOG PERFORMANCE STATUS: 1 - Symptomatic but completely ambulatory  Filed Vitals:   06/10/15 1139  BP: 160/59  Pulse: 98  Temp: 98.3 F (36.8 C)  Resp: 19   Filed Weights   06/10/15 1139  Weight: 196 lb (88.905 kg)    GENERAL:alert, no distress and comfortable SKIN: skin color, texture, turgor are normal, no rashes or significant lesions EYES: normal, conjunctiva are pink and non-injected, sclera clear OROPHARYNX:no exudate, no erythema and lips, buccal mucosa, and tongue normal  NECK: supple, thyroid normal size, non-tender, without nodularity LYMPH:  no palpable lymphadenopathy in the cervical, axillary or inguinal LUNGS: clear to auscultation and percussion with normal breathing effort HEART: regular rate & rhythm and no murmurs and no lower extremity edema ABDOMEN:abdomen soft, non-tender and normal bowel sounds Musculoskeletal:no cyanosis of digits and no clubbing  PSYCH: alert & oriented x 3 with fluent speech NEURO: no focal motor/sensory deficits BREAST: Palpable lump in the right breast. No palpable axillary or supraclavicular lymphadenopathy (exam performed in the presence of a chaperone)   LABORATORY DATA:  I have reviewed the data as listed Lab Results  Component Value Date   WBC 5.6 06/10/2015   HGB 12.7 06/10/2015   HCT 39.1 06/10/2015   MCV 94.1 06/10/2015   PLT 184 06/10/2015   Lab Results  Component Value Date   NA 142 06/10/2015   K 4.0 06/10/2015   CL 104 05/20/2015   CO2 25 06/10/2015    RADIOGRAPHIC STUDIES: I have personally reviewed the radiological reports and agreed with the findings in the report.  ASSESSMENT AND PLAN:  Breast cancer of upper-outer quadrant of right female breast ([place]) Right breast biopsy 10:00 position 06/02/2015: Invasive lobular cancer with LCIS, ER 95%, PR 80%, Ki-67 15%, HER-2 negative duration 1.61, ultrasound revealed a 1.9 x 1 x 1.4 cm lesion, T1 cN0 stage IA clinical stage  Pathology and radiology  counseling:Discussed with the patient, the details of pathology including the type of breast cancer,the clinical staging, the significance of ER, PR and HER-2/neu receptors and the implications for treatment. After reviewing the pathology in detail, we proceeded to discuss the different treatment options between surgery, radiation, and antiestrogen therapies.  Recommendations: 1. Breast conserving surgery followed by 2. Adjuvant radiation therapy followed by 4. Adjuvant antiestrogen therapy  I discussed with the patient that I would not send Oncotype DX testing mainly because of her elderly age as well as the fact that this is a lobular cancer with ER/PR positivity. Return to clinic after surgery to discuss final pathology report.     All questions were answered. The patient knows to call the clinic with any problems, questions or concerns.    Rulon Eisenmenger, MD 06/10/2015

## 2015-06-10 NOTE — H&P (Signed)
[name] [name] 06/10/2015 7:57 AM Location: [facility] Patient #: [id number] DOB: [birth year] Undefined / Language: [name] / Race: Black or African American Female  History of Present Illness Marcello Moores A. Blandon Offerdahl MD; 06/10/2015 2:59 PM) Patient words: asked to see the patient at the request of Dr. Lisbeth Renshaw for right breast mass mass in the upper outer quadrant workup and was found to have a 1.9 cm mass by ultrasound and was found to have a 1.9 cm mass by ultrasound and was found to have a 1.9 cm mass by ultrasound and maminvasive core biopsy-proven to be consistent with invasivelobular carcinoma ER positive PR positive HER-2/neu negative with a Ki-67 of 15%.      CLINICAL DATA: Patient has a palpable abnormality in the upper outer right breast. This is her first mammogram. EXAM: 2D DIGITAL DIAGNOSTIC BILATERAL MAMMOGRAM WITH CAD AND ADJUNCT TOMO ULTRASOUND RIGHT BREAST COMPARISON: None ACR Breast Density Category b: There are scattered areas of fibroglandular density. FINDINGS: There is a focal area of opacity as well as architectural distortion and a few fine calcifications in the upper outer right breast corresponding to the area of the palpable abnormality. There is a smooth oval mass in the medial right breast. No left breast masses. No suspicious calcifications on the left or left architectural distortion. Mammographic images were processed with CAD. On physical exam, there is a firm fixed mass in the upper outer right breast. Targeted ultrasound is performed, showing an irregular focal masslike area of shadowing in the right breast at 10 o'clock measuring 19 x 10 x 14 mm. This corresponds to the palpable abnormality and mammographic abnormalities. In the 2:30 o'clock position of the right breast, 2 cm the nipple, there is an oval cyst measuring 6 x 4 x 3 mm corresponding to the benign-appearing mammographic mass. Sonographic evaluation of the right axilla shows no enlarged  or abnormal lymph nodes. IMPRESSION: 1. Irregular mass in the upper outer right breast with associated fine calcifications and architectural distortion. This is highly suspicious for breast carcinoma. RECOMMENDATION: Ultrasound-guided core needle biopsy. I have discussed the findings and recommendations with the patient. Results were also provided in writing at the conclusion of the visit. If applicable, a reminder letter will be sent to the patient regarding the next appointment. BI-RADS CATEGORY 5: Highly suggestive of malignancy. Electronically Signed By: Lajean Manes M.D. On: 05/26/2015 11:45           ADDITIONAL INFORMATION: FLUORESCENCE IN-SITU HYBRIDIZATION Results: HER2 - NEGATIVE RATIO OF HER2/CEP17 SIGNALS 1.61 AVERAGE HER2 COPY NUMBER PER CELL 3.05 Reference Range: NEGATIVE HER2/CEP17 Ratio <2.0 and average HER2 copy number <4.0 EQUIVOCAL HER2/CEP17 Ratio <2.0 and average HER2 copy number >=4.0 and <6.0 POSITIVE HER2/CEP17 Ratio >=2.0 or <2.0 and average HER2 copy number >=6.0 Enid Cutter MD Pathologist, Electronic Signature ( Signed 06/08/2015) PROGNOSTIC INDICATORS Results: IMMUNOHISTOCHEMICAL AND MORPHOMETRIC ANALYSIS PERFORMED MANUALLY Estrogen Receptor: 95%, POSITIVE, STRONG STAINING INTENSITY Progesterone Receptor: 80%, POSITIVE, STRONG STAINING INTENSITY Proliferation Marker Ki67: 15% REFERENCE RANGE ESTROGEN RECEPTOR NEGATIVE 0% POSITIVE =>1% REFERENCE RANGE PROGESTERONE RECEPTOR NEGATIVE 0% POSITIVE =>1% 1 of 3 FINAL for [name] [name], [name] [name] ([id number]) ADDITIONAL INFORMATION:(continued) All controls stained appropriately Enid Cutter MD Pathologist, Electronic Signature ( Signed 06/04/2015) Addendum: An E-cadherin immunohistochemical stain is performed, which is negative in the invasive and in situ components of the tumor, confirming the lobular nature of both (i.e. invasive lobular carcinoma with lobular carcinoma in situ). (RH:ecj  06/03/2015) Willeen Niece MD Pathologist, Electronic Signature ( Signed 06/03/2015) FINAL DIAGNOSIS Diagnosis Breast,  right, needle core biopsy, 10:00 o'clock - INVASIVE MAMMARY CARCINOMA. - MAMMARY CARCINOMA IN SITU. - SEE COMMENT. Microscopic Comment An E-cadherin immunohistochemical stain will be performed and reported in an addendum to determine if the invasive and in situ carcinoma is ductal or lobular in nature. Although definitive grading of breast carcinoma is best done on excision, the features of the invasive tumor from the right 10 o'clock breast biopsy are compatible with a grade II breast carcinoma. Breast prognostic markers will be performed and reported in an addendum. Findings are called to the [facility] on 06/03/15. Dr. Lyndon Code has seen this case in consultation with agreement. (RAH:gt, 06/03/15) Willeen Niece MD Pathologist, Electronic Signature (Case signed 06/03/2015) Specimen Gross and Clinical Information Specimen Comment In formalin 2:45, extracted <2 mins Specimen(s) Obtained: Breast, right, needle core biopsy, 10:00 o'clock Specimen Clinical Information Right lump/mass suspicious for malignancy Gross Received in formalin are 3 cores of soft, tan-red tissue which measure 1.5 x 0.2 x 0.2 cm and up to 1.9 x 0.3 x 0.2 cm. Time in Formalin 2:45 pm. Submitted in toto in 1 block(s) 2 of 3 FINAL for [name] [name], [name] [name] [phone]) Stain(s) used in Diagnosis: The following stain(s) were used in diagnosing the case: Her2 FISH, PR-ACIS, KI-67-ACIS, ER-ACIS, E-CAD. The control(s) stained appropriately. Disclaimer Estrogen receptor (6F11), immunohistochemical stains are performed on formalin fixed, paraffin embedded tissue using a 3,3"-diaminobenzidine (DAB) chromogen and Leica Bond Autostainer System. The staining intensity of the nucleus is scored manually and is reported as the percentage of tumor cell nuclei demonstrating specific nuclear  staining. Ki-67 (MM1), immunohistochemical stains are performed on formalin fixed, paraffin embedded tissue using a 3,3"-diaminobenzidine (DAB) chromogen and Leica Bond Autostainer System. The staining intensity of the nucleus is scored manually and is reported as the percentage of tumor cell nuclei demonstrating specific nuclear staining. HER2 IQFISH pharmDX (code [id number]) is a direct fluorescence in-situ hybridization assay designed to quantitatively determine HER2 gene amplification in formalin-fixed, paraffin-embedded tissue specimens. It is performed at [facility] and is reported using ASCO/CAP scoring criteria published in 2013. Some of these immunohistochemical stains may have been developed and the performance characteristics determined by [facility]. Some may not have been cleared or approved by the U.S. Food and Drug Administration. The FDA has determined that such clearance or approval is not necessary. This test is used for clinical purposes. It should not be regarded as investigational or for research. This laboratory is certified under the [place] (CLIA-88) as qualified to perform high complexity clinical laboratory testing. PR progesterone receptor (16), immunohistochemical stains are performed on formalin fixed, paraffin embedded tissue using a 3,3"-diaminobenzidine (DAB) chromogen and Leica Bond Autostainer System. The staining intensity of the nucleus is scored manually and is reported as the percentage of tumor cell nuclei demonstrating specific nuclear staining. Report signed out from the following location(s) Technical Component was performed at [facility]. [address],[place] [address].[id number]., Technical component and interpretation was performed at [facility] [place] [place], [place], [place] [address]. CLIA #: [id number], 3 of.  The patient is a  81 year old female.   Other Problems Conni Slipper, RN; 06/10/2015 7:58 AM) Arthritis Cholelithiasis Hemorrhoids High blood pressure Hypercholesterolemia  Past Surgical History Conni Slipper, RN; 06/10/2015 7:58 AM) Breast Biopsy Right. Cataract Surgery Bilateral. Gallbladder Surgery - Open Hysterectomy (not due to cancer) - Complete  Diagnostic Studies History Conni Slipper, RN; 06/10/2015 7:58 AM) Colonoscopy 5-10 years ago Mammogram  within last year Pap Smear >5 years ago  Medication History Conni Slipper, RN; 06/10/2015 7:58 AM) No Current Medications Medications Reconciled  Social History Conni Slipper, RN; 06/10/2015 7:58 AM) Caffeine use Coffee. No alcohol use No drug use Tobacco use Never smoker.  Family History Conni Slipper, RN; 06/10/2015 7:58 AM) Cancer Mother. Heart Disease Father. Heart disease in female family member before age 11  Pregnancy / Birth History Conni Slipper, RN; 06/10/2015 7:58 AM) Age at menarche 6 years. Age of menopause <47 Contraceptive History Oral contraceptives. Gravida 3 Maternal age 26-20 Para 3     Review of Systems Conni Slipper RN; 06/10/2015 7:58 AM) General Not Present- Appetite Loss, Chills, Fatigue, Fever, Night Sweats, Weight Gain and Weight Loss. Skin Not Present- Change in Wart/Mole, Dryness, Hives, Jaundice, New Lesions, Non-Healing Wounds, Rash and Ulcer. HEENT Present- Wears glasses/contact lenses. Not Present- Earache, Hearing Loss, Hoarseness, Nose Bleed, Oral Ulcers, Ringing in the Ears, Seasonal Allergies, Sinus Pain, Sore Throat, Visual Disturbances and Yellow Eyes. Respiratory Not Present- Bloody sputum, Chronic Cough, Difficulty Breathing, Snoring and Wheezing. Breast Not Present- Breast Mass, Breast Pain, Nipple Discharge and Skin Changes. Cardiovascular Not Present- Chest Pain, Difficulty Breathing Lying Down, Leg Cramps, Palpitations, Rapid Heart Rate, Shortness of Breath and Swelling of  Extremities. Gastrointestinal Present- Hemorrhoids. Not Present- Abdominal Pain, Bloating, Bloody Stool, Change in Bowel Habits, Chronic diarrhea, Constipation, Difficulty Swallowing, Excessive gas, Gets full quickly at meals, Indigestion, Nausea, Rectal Pain and Vomiting. Female Genitourinary Not Present- Frequency, Nocturia, Painful Urination, Pelvic Pain and Urgency. Musculoskeletal Present- Joint Stiffness. Not Present- Back Pain, Joint Pain, Muscle Pain, Muscle Weakness and Swelling of Extremities. Neurological Not Present- Decreased Memory, Fainting, Headaches, Numbness, Seizures, Tingling, Tremor, Trouble walking and Weakness. Psychiatric Not Present- Anxiety, Bipolar, Change in Sleep Pattern, Depression, Fearful and Frequent crying. Endocrine Not Present- Cold Intolerance, Excessive Hunger, Hair Changes, Heat Intolerance, Hot flashes and New Diabetes. Hematology Not Present- Easy Bruising, Excessive bleeding, Gland problems, HIV and Persistent Infections.   Physical Exam (Audelia Knape A. Sonia Stickels MD; 06/10/2015 2:57 PM)  General Mental Status-Alert. General Appearance-Consistent with stated age. Hydration-Well hydrated. Voice-Normal.  Head and Neck Head-normocephalic, atraumatic with no lesions or palpable masses. Trachea-midline. Thyroid Gland Characteristics - normal size and consistency.  Chest and Lung Exam Chest and lung exam reveals -quiet, even and easy respiratory effort with no use of accessory muscles and on auscultation, normal breath sounds, no adventitious sounds and normal vocal resonance. Inspection Chest Wall - Normal. Back - normal.  Breast Note: 1 cm breast mass right upper outer quadrant mobile  Left breast normal.  Neurologic Neurologic evaluation reveals -alert and oriented x 3 with no impairment of recent or remote memory. Mental Status-Normal.  Musculoskeletal Normal Exam - Left-Upper Extremity Strength Normal and Lower Extremity  Strength Normal. Normal Exam - Right-Upper Extremity Strength Normal and Lower Extremity Strength Normal.  Lymphatic Head & Neck  General Head & Neck Lymphatics: Bilateral - Description - Normal. Axillary  General Axillary Region: Bilateral - Description - Normal. Tenderness - Non Tender.    Assessment & Plan (Jarius Dieudonne A. Shanera Meske MD; 06/10/2015 2:59 PM)  BREAST CANCER, RIGHT (C50.911) Impression: discussed right breast partial mastectomy with sentinel lymph node mapping versus right mastectomy with reconstruction. Pros and cons of each discussed in great length.She is opted for right breast seed localized partial Risk of sentinel lymph node mapping include bleeding, infection, lymphedema, shoulder pain. stiffness, dye allergy. cosmetic deformity , blood clots, death, need for more surgery. Pt agres to proceed. Risk  of lumpectomy include bleeding, infection, seroma, more surgery, use of seed/wire, wound care, cosmetic deformity and the need for other treatments, death , blood clots, death. Pt agrees to proceed. mastectomy and right sentinel lymph node mapping.  Current Plans You are being scheduled for surgery - Our schedulers will call you.  You should hear from our office's scheduling department within 5 working days about the location, date, and time of surgery. We try to make accommodations for patient's preferences in scheduling surgery, but sometimes the OR schedule or the surgeon's schedule prevents [place] from making those accommodations.  If you have not heard from our office [phone]) in 5 working days, call the office and ask for your surgeon's nurse.  If you have other questions about your diagnosis, plan, or surgery, call the office and ask for your surgeon's nurse.  Pt Education - [facility] Breast Cancer Information Given - [facility] "Breast Journey" Package We discussed the staging and pathophysiology of breast cancer. We discussed all of the different options for treatment for  breast cancer including surgery, chemotherapy, radiation therapy, Herceptin, and antiestrogen therapy. We discussed a sentinel lymph node biopsy as she does not appear to having lymph node involvement right now. We discussed the performance of that with injection of radioactive tracer and blue dye. We discussed that she would have an incision underneath her axillary hairline. We discussed that there is a bout a 10-20% chance of having a positive node with a sentinel lymph node biopsy and we will await the permanent pathology to make any other first further decisions in terms of her treatment. One of these options might be to return to the operating room to perform an axillary lymph node dissection. We discussed about a 1-2% risk lifetime of chronic shoulder pain as well as lymphedema associated with a sentinel lymph node biopsy. We discussed the options for treatment of the breast cancer which included lumpectomy versus a mastectomy. We discussed the performance of the lumpectomy with a wire placement. We discussed a 10-20% chance of a positive margin requiring reexcision in the operating room. We also discussed that she may need radiation therapy or antiestrogen therapy or both if she undergoes lumpectomy. We discussed the mastectomy and the postoperative care for that as well. We discussed that there is no difference in her survival whether she undergoes lumpectomy with radiation therapy or antiestrogen therapy versus a mastectomy. There is a slight difference in the local recurrence rate being 3-5% with lumpectomy and about 1% with a mastectomy. We discussed the risks of operation including bleeding, infection, possible reoperation. She understands her further therapy will be based on what her stages at the time of her operation.  Pt Education - [facility] breast cancer surgery: discussed with patient and provided information. Pt Education - [facility] Breast Biopsy HCI: discussed with patient and provided information.

## 2015-06-10 NOTE — Therapy (Signed)
[place], [place], [address] Phone: [phone]   Fax:  [phone]  Physical Therapy Evaluation  Patient Details  Name: [name] [name] MRN: [id number] Date of Birth: 03-Aug-1934 Referring Provider: Dr. Erroll Luna  Encounter Date: 06/10/2015      PT End of Session - 06/10/15 1624    Visit Number 1   Number of Visits 1   PT Start Time 1700   PT Stop Time 1749  Also saw pt from 1438-1454 for a total of 24 minutes   PT Time Calculation (min) 8 min   Activity Tolerance Patient tolerated treatment well   Behavior During Therapy [facility] for tasks assessed/performed      Past Medical History  Diagnosis Date  . Arthritis   . Diabetes mellitus   . Hyperlipidemia   . Hypertension   . Chronic kidney disease     stones  . UTI (urinary tract infection)   . Breast cancer of upper-outer quadrant of right female breast ([place]) 06/04/2015    Past Surgical History  Procedure Laterality Date  . Cholecystectomy  1989  . Tonsillectomy  1953  . Abdominal hysterectomy  1980  . Cataract extraction      There were no vitals filed for this visit.       Subjective Assessment - 06/10/15 1603    Subjective Patient reports she is here today for an assessment of her newly diagnosed right breast cancer.   Patient is accompained by: Family member   Pertinent History Patient was diagnosed on 05/26/15 with right invasive lobular carcinoma breast cancer.  It measures 1.9 cm and is in the upper outer quadrant.  It is ER/PR positive and HEr2 negative with a Ki67 of 15%.  Her multidisciplinary team met prior to her assessment to discuss a recommended treatment plan.   Patient Stated Goals Reduce lymphedema risk and learn post op shoulder ROM HEP   Currently in Pain? No/denies            [facility] PT Assessment - 06/10/15 0001    Assessment   Medical Diagnosis Right breast cancer   Referring Provider Dr. Marcello Moores [place]    Onset Date/Surgical Date 05/26/15   Hand Dominance Right   Prior Therapy none   Precautions   Precautions Other (comment)   Precaution Comments Active breast cancer   Restrictions   Weight Bearing Restrictions No   Balance Screen   Has the patient fallen in the past 6 months No   Has the patient had a decrease in activity level because of a fear of falling?  No   Is the patient reluctant to leave their home because of a fear of falling?  No   Home Social worker Private residence   Living Arrangements Alone   Available Help at Discharge Family   Prior Function   Level of [place] Retired   Leisure She walks 45-60 minutes 3x.week and bowls twice a week   Cognition   Overall Cognitive Status Within Functional Limits for tasks assessed   Posture/Postural Control   Posture/Postural Control Postural limitations   Postural Limitations Forward head;Rounded Shoulders   ROM / Strength   AROM / PROM / Strength AROM;Strength   AROM   AROM Assessment Site Shoulder   Right/Left Shoulder Right;Left   Right Shoulder Extension 51 Degrees   Right Shoulder Flexion 137 Degrees   Right Shoulder ABduction 149 Degrees   Right Shoulder Internal Rotation  64 Degrees   Right Shoulder External Rotation 77 Degrees   Left Shoulder Extension 62 Degrees   Left Shoulder Flexion 135 Degrees   Left Shoulder ABduction 145 Degrees   Left Shoulder Internal Rotation 68 Degrees   Left Shoulder External Rotation 84 Degrees   Strength   Overall Strength Within functional limits for tasks performed           LYMPHEDEMA/ONCOLOGY QUESTIONNAIRE - 06/10/15 1614    Type   Cancer Type Right breast cancer   Lymphedema Assessments   Lymphedema Assessments Upper extremities   Right Upper Extremity Lymphedema   10 cm Proximal to Olecranon Process 31.5 cm   Olecranon Process 27.5 cm   10 cm Proximal to Ulnar Styloid Process 22.4 cm   Just Proximal to Ulnar Styloid  Process 16.2 cm   Across Hand at [employer] 20.4 cm   At [place] of 2nd Digit 7.5 cm   Left Upper Extremity Lymphedema   10 cm Proximal to Olecranon Process 31.6 cm   Olecranon Process 27.5 cm   10 cm Proximal to Ulnar Styloid Process 21.8 cm   Just Proximal to Ulnar Styloid Process 16 cm   Across Hand at [employer] 19.8 cm   At [place] of 2nd Digit 7.1 cm      Patient was instructed today in a home exercise program today for post op shoulder range of motion. These included active assist shoulder flexion in sitting, scapular retraction, wall walking with shoulder abduction, and hands behind head external rotation.  She was encouraged to do these twice a day, holding 3 seconds and repeating 5 times when permitted by her physician.         PT Education - 06/10/15 1623    Education provided Yes   Education Details Lymphedema risk reduction and post op shoulder ROM HEP   Person(s) Educated Patient   Methods Explanation;Demonstration;Handout   Comprehension Verbalized understanding;Returned demonstration              Breast Clinic Goals - 06/10/15 1632    Patient will be able to verbalize understanding of pertinent lymphedema risk reduction practices relevant to her diagnosis specifically related to skin care.   Time 1   Period Days   Status Achieved   Patient will be able to return demonstrate and/or verbalize understanding of the post-op home exercise program related to regaining shoulder range of motion.   Time 1   Period Days   Status Achieved   Patient will be able to verbalize understanding of the importance of attending the postoperative After Breast Cancer Class for further lymphedema risk reduction education and therapeutic exercise.   Time 1   Period Days   Status Achieved              Plan - 06/10/15 1626    Clinical Impression Statement Patient was diagnosed on 05/26/15 with right invasive lobular carcinoma breast cancer.  It measures 1.9 cm and is in  the upper outer quadrant.  It is ER/PR positive and HER2 negative with a Ki67 of 15%.  Her multidisciplinary team met prior to her assessment to discuss a recommended treatment plan.  She is planning to have a right lumpectomy and sentinel node biopsy followed by radiation and anti-estrogen therapy.  She may benefit from post op PT to regain shoulder ROM and strength and reduce lymphedema risk.   Rehab Potential Excellent   Clinical Impairments Affecting Rehab Potential none   PT Frequency One time visit  PT Treatment/Interventions Therapeutic exercise;Patient/family education   PT Next Visit Plan Will f/u after surgery   PT Home Exercise Plan Post op shoulder ROM HEP   Consulted and Agree with Plan of Care Patient;Family member/caregiver   Family Member Consulted sister     Patient will follow up at outpatient cancer rehab if needed following surgery.  If the patient requires physical therapy at that time, a specific plan will be dictated and sent to the referring physician for approval. The patient was educated today on appropriate basic range of motion exercises to begin post operatively and the importance of attending the After Breast Cancer class following surgery.  Patient was educated today on lymphedema risk reduction practices as it pertains to recommendations that will benefit the patient immediately following surgery.  She verbalized good understanding.  No additional physical therapy is indicated at this time.     Patient will benefit from skilled therapeutic intervention in order to improve the following deficits and impairments:  Decreased strength, Decreased knowledge of precautions, Pain, Impaired UE functional use, Decreased range of motion  Visit Diagnosis: Breast cancer of upper-outer quadrant of right female breast ([place]) - Plan: PT plan of care cert/re-cert  Abnormal posture - Plan: PT plan of care cert/re-cert      G-Codes - 67/67/20 1632    Functional Assessment Tool  Used Clinical Judgement   Functional Limitation Other PT primary   Other PT Primary Current Status (N4709) At least 1 percent but less than 20 percent impaired, limited or restricted   Other PT Primary Goal Status (G2836) At least 1 percent but less than 20 percent impaired, limited or restricted   Other PT Primary Discharge Status (O2947) At least 1 percent but less than 20 percent impaired, limited or restricted       Problem List Patient Active Problem List   Diagnosis Date Noted  . Breast cancer of upper-outer quadrant of right female breast ([place]) 06/04/2015  . Glaucoma 11/19/2014  . Type 2 diabetes mellitus, uncontrolled ([place]) 05/19/2014  . Obesity (BMI 30-39.9) 11/12/2012  . Type 2 diabetes mellitus ([place]) 10/04/2010  . Hypertension 10/04/2010  . Hyperlipidemia 10/04/2010  . Kidney stone 10/04/2010    Annia Friendly, PT 06/10/2015 4:38 PM  [place], [place], [address] Phone: [phone]   Fax:  [phone]  Name: [name] [name] MRN: [id number] Date of Birth: 12/19/1934

## 2015-06-10 NOTE — Progress Notes (Signed)
Radiation Oncology         [phone] ________________________________  Name: [name] [name] MRN: [id number]  Date: 06/10/2015  DOB: 1934-03-21  TF:[id number],YHCWC W, MD  [name] Luna, MD     REFERRING PHYSICIAN: Erroll Luna, MD   DIAGNOSIS: The encounter diagnosis was Breast cancer of upper-outer quadrant of right female breast ([place]).   HISTORY OF PRESENT ILLNESS:[name] [name] is a 81 y.o. female who is seen for an initial consultation visit. She had a mammogram and ultrasound 05/26/2015, revealing 2 masses in the right breast. The ultrasound showed the first mass at the 10 o'clock position measuring 19 x 10 x 14 mm. The second mass is in the 2:30 o'clock position, 2 cm from the nipple, showed an oval cyst measuring 6 x 4 x 3 mm and corresponded to the benign-appearing mammographic mass. The right axilla showed no enlarged or abnormal lymph nodes. She had a biopsy of the right breast in the 10 o'clock position 06/02/2015, revealing invasive mammary carcinoma and mammary carcinoma in situ. The pathology shois wed the mass was ER (95%), PR (80%), Her2-neu negative, and Ki 67 (15%). She comes today for further discussion in the [facility] clinic regarding her treatment options and meets with Dr. Lisbeth [name] for discussion of the role of radiation.   PREVIOUS RADIATION THERAPY: No   PAST MEDICAL HISTORY: Past Medical History  Diagnosis Date  . Arthritis   . Diabetes mellitus   . Hyperlipidemia   . Hypertension   . Chronic kidney disease     stones  . UTI (urinary tract infection)   . Breast cancer of upper-outer quadrant of right female breast ([place]) 06/04/2015      PAST SURGICAL HISTORY: Past Surgical History  Procedure Laterality Date  . Cholecystectomy  1989  . Tonsillectomy  1953  . Abdominal hysterectomy  1980  . Cataract extraction       FAMILY HISTORY:  Family History  Problem Relation Age of Onset  . Cancer Mother     uterine  . Heart disease Father 32  . Diabetes  Sister   . Diabetes Brother      SOCIAL HISTORY:  reports that she has never smoked. She does not have any smokeless tobacco history on file. She reports that she does not drink alcohol or use illicit drugs. She is married. She resides in [place], and enjoys bowling.   ALLERGIES: Percocet; Actos; Lisinopril; Penicillins; and Zocor   MEDICATIONS:  Current Outpatient Prescriptions  Medication Sig Dispense Refill  . aspirin 81 MG tablet Take 81 mg by mouth daily.      Marland Kitchen atorvastatin (LIPITOR) 20 MG tablet Take 1 tablet (20 mg total) by mouth daily. 90 tablet 3  . glimepiride (AMARYL) 4 MG tablet Take 1 tablet (4 mg total) by mouth every morning. 90 tablet 3  . Glucosamine-Chondroitin (OSTEO BI-FLEX REGULAR STRENGTH PO) Take by mouth.    . latanoprost (XALATAN) 0.005 % ophthalmic solution Place 1 drop into both eyes at bedtime.  4  . losartan-hydrochlorothiazide (HYZAAR) 50-12.5 MG tablet Take 1 tablet by mouth daily. 90 tablet 3  . metFORMIN (GLUCOPHAGE) 500 MG tablet Take 2 tablets each morning and one at night. 270 tablet 3   No current facility-administered medications for this encounter.     REVIEW OF SYSTEMS:  On review of systems, the patient reports that she is doing well overall. She denies any chest pain, shortness of breath, cough, fevers, chills, night sweats, unintended weight changes. She denies any bowel  or bladder disturbances, and denies abdominal pain, nausea or vomiting. She denies any new musculoskeletal or joint aches or pains. A complete review of systems is obtained and is otherwise negative.     PHYSICAL EXAM:  vitals were not taken for this visit.  Vitals with BMI 06/10/2015  Height 5' 1.5"  Weight 196 lbs  BMI 47.4  Systolic 259  Diastolic 59  Pulse 98  Respirations 19  Pain scale 0/10 In general this is a well appearing female in no acute distress. She is alert and oriented x4 and appropriate throughout the examination. Skin is intact. Cardiovascular  exam reveals a regular rate and rhythm, no clicks rubs or murmurs are auscultated. Chest is clear to auscultation bilaterally. Lymphatic assessment is performed and does not reveal any adenopathy in the cervical, supraclavicular, axillary, or inguinal chains. Bilateral breasts are examined, the left does not have any palpable abnormalities. No nipple discharge or bleeding is present. The right breast is also examined and minimal ecchymosis are present from her previous biopsy. There is a slightly mobile fullness about 2cm in greatest dimension at the site of her previous biopsy. No other abnormalities are present. No nipple bleeding or discharge is noted. Abdomen has active bowel sounds in all quadrants and is intact. The abdomen is soft, non tender, non distended. Lower extremities are negative for pretibial pitting edema, deep calf tenderness, cyanosis or clubbing.   ECOG = 0  0 - Asymptomatic (Fully active, able to carry on all predisease activities without restriction)  1 - Symptomatic but completely ambulatory (Restricted in physically strenuous activity but ambulatory and able to carry out work of a light or sedentary nature. For example, light housework, office work)  2 - Symptomatic, <50% in bed during the day (Ambulatory and capable of all self care but unable to carry out any work activities. Up and about more than 50% of waking hours)  3 - Symptomatic, >50% in bed, but not bedbound (Capable of only limited self-care, confined to bed or chair 50% or more of waking hours)  4 - Bedbound (Completely disabled. Cannot carry on any self-care. Totally confined to bed or chair)  5 - Death   Eustace Pen MM, Creech RH, Tormey DC, et al. [phone]). "Toxicity and response criteria of the [facility]". [place] Oncol. 5 (6): 649-55   LABORATORY DATA:  Lab Results  Component Value Date   WBC 5.6 06/10/2015   HGB 12.7 06/10/2015   HCT 39.1 06/10/2015   MCV 94.1 06/10/2015   PLT  184 06/10/2015   Lab Results  Component Value Date   NA 142 06/10/2015   K 4.0 06/10/2015   CL 104 05/20/2015   CO2 25 06/10/2015   Lab Results  Component Value Date   ALT 12 06/10/2015   AST 11 06/10/2015   ALKPHOS 83 06/10/2015   BILITOT 0.91 06/10/2015      RADIOGRAPHY: Mm Digital Diagnostic Unilat R  06/02/2015  CLINICAL DATA:  Evaluate marker placement EXAM: DIAGNOSTIC RIGHT MAMMOGRAM POST ULTRASOUND BIOPSY COMPARISON:  Previous exam(s). FINDINGS: Mammographic images were obtained following ultrasound guided biopsy of a right breast mass. The biopsy clip is within the palpable right breast mass. IMPRESSION: The biopsy clip is in good position. Final Assessment: Post Procedure Mammograms for Marker Placement Electronically Signed   By: Dorise Bullion III M.D   On: 06/02/2015 15:10   US Breast Ltd Uni Right Inc Axilla  05/26/2015  CLINICAL DATA:  Patient has a palpable abnormality  in the upper outer right breast. This is her first mammogram. EXAM: 2D DIGITAL DIAGNOSTIC BILATERAL MAMMOGRAM WITH CAD AND ADJUNCT TOMO ULTRASOUND RIGHT BREAST COMPARISON:  None ACR Breast Density Category b: There are scattered areas of fibroglandular density. FINDINGS: There is a focal area of opacity as well as architectural distortion and a few fine calcifications in the upper outer right breast corresponding to the area of the palpable abnormality. There is a smooth oval mass in the medial right breast. No left breast masses. No suspicious calcifications on the left or left architectural distortion. Mammographic images were processed with CAD. On physical exam, there is a firm fixed mass in the upper outer right breast. Targeted ultrasound is performed, showing an irregular focal masslike area of shadowing in the right breast at 10 o'clock measuring 19 x 10 x 14 mm. This corresponds to the palpable abnormality and mammographic abnormalities. In the 2:30 o'clock position of the right breast, 2 cm the nipple,  there is an oval cyst measuring 6 x 4 x 3 mm corresponding to the benign-appearing mammographic mass. Sonographic evaluation of the right axilla shows no enlarged or abnormal lymph nodes. IMPRESSION: 1. Irregular mass in the upper outer right breast with associated fine calcifications and architectural distortion. This is highly suspicious for breast carcinoma. RECOMMENDATION: Ultrasound-guided core needle biopsy. I have discussed the findings and recommendations with the patient. Results were also provided in writing at the conclusion of the visit. If applicable, a reminder letter will be sent to the patient regarding the next appointment. BI-RADS CATEGORY  5: Highly suggestive of malignancy. Electronically Signed   By: Lajean Manes M.D.   On: 05/26/2015 11:45   Mm Diag Breast Tomo Bilateral  05/26/2015  CLINICAL DATA:  Patient has a palpable abnormality in the upper outer right breast. This is her first mammogram. EXAM: 2D DIGITAL DIAGNOSTIC BILATERAL MAMMOGRAM WITH CAD AND ADJUNCT TOMO ULTRASOUND RIGHT BREAST COMPARISON:  None ACR Breast Density Category b: There are scattered areas of fibroglandular density. FINDINGS: There is a focal area of opacity as well as architectural distortion and a few fine calcifications in the upper outer right breast corresponding to the area of the palpable abnormality. There is a smooth oval mass in the medial right breast. No left breast masses. No suspicious calcifications on the left or left architectural distortion. Mammographic images were processed with CAD. On physical exam, there is a firm fixed mass in the upper outer right breast. Targeted ultrasound is performed, showing an irregular focal masslike area of shadowing in the right breast at 10 o'clock measuring 19 x 10 x 14 mm. This corresponds to the palpable abnormality and mammographic abnormalities. In the 2:30 o'clock position of the right breast, 2 cm the nipple, there is an oval cyst measuring 6 x 4 x 3 mm  corresponding to the benign-appearing mammographic mass. Sonographic evaluation of the right axilla shows no enlarged or abnormal lymph nodes. IMPRESSION: 1. Irregular mass in the upper outer right breast with associated fine calcifications and architectural distortion. This is highly suspicious for breast carcinoma. RECOMMENDATION: Ultrasound-guided core needle biopsy. I have discussed the findings and recommendations with the patient. Results were also provided in writing at the conclusion of the visit. If applicable, a reminder letter will be sent to the patient regarding the next appointment. BI-RADS CATEGORY  5: Highly suggestive of malignancy. Electronically Signed   By: Lajean Manes M.D.   On: 05/26/2015 11:45   [place] Rt Breast Bx W Loc Dev 1st  Lesion Img Bx Spec US Guide  06/03/2015  ADDENDUM REPORT: 06/03/2015 13:11 ADDENDUM: Pathology revealed GRADE II INVASIVE MAMMARY CARCINOMA, MAMMARY CARCINOMA IN SITU of the Right breast at the 10:00 o'clock location. This was found to be concordant by Dr. Dorise Bullion. Pathology results were discussed with the patient by telephone. The patient reported doing well after the biopsy. Post biopsy instructions and care were reviewed and questions were answered. The patient was encouraged to call [facility] for any additional concerns. The patient was referred to [facility] at [facility] on June 10, 2015. Pathology results reported by Terie Purser, RN on 06/03/2015. Electronically Signed   By: Dorise Bullion III M.D   On: 06/03/2015 13:11  06/03/2015  CLINICAL DATA:  Right breast lump/distortion EXAM: ULTRASOUND GUIDED RIGHT BREAST CORE NEEDLE BIOPSY COMPARISON:  Previous exam(s). FINDINGS: I met with the patient and we discussed the procedure of ultrasound-guided biopsy, including benefits and alternatives. We discussed the high likelihood of a successful procedure. We  discussed the risks of the procedure, including infection, bleeding, tissue injury, clip migration, and inadequate sampling. Informed written consent was given. The usual time-out protocol was performed immediately prior to the procedure. Using sterile technique and 1% Lidocaine as local anesthetic, under direct ultrasound visualization, a 14 gauge spring-loaded device was used to perform biopsy of a right breast mass/distortion using a lateral approach. At the conclusion of the procedure a tissue marker clip was deployed into the biopsy cavity. Follow up 2 view mammogram was performed and dictated separately. IMPRESSION: Ultrasound guided biopsy of a right breast mass/distortion. No apparent complications. Electronically Signed: By: Dorise Bullion III M.D On: 06/02/2015 14:44    IMPRESSION: Ms. Valenti is a 81 yo female with a clinical stage IA, T1cN0 grade 2 invasive lobular carcinoma, ER/PR positive, Her2 negative breast cancer of the upper-outer quadrant of the right female breast. She is a good candidate for a right lumpectomy and sentinel node biopsy, external radiation, and aromatase inhibitor.   PLAN: Dr. Lisbeth [name] discusses the pathology findings and reviews the nature of this pathology and reviews the work up to date. The consensus from the breast conference include breast conservation with lumpectomy to both sites, with subsequent external radiotherapy to the breast followed by antiestrogen therapy. Dr. Lisbeth [name] discusses that he would recommend 20 fractions to the right breast and reviews the risks, benefits, short and long term effects of radiotherapy. The patient is interested in moving forward and we will see her about 2 weeks after surgery to begin planning for radiation. We would anticipate beginning radiation 1 month after surgery.   The above documentation reflects my direct findings during this shared patient visit. Please see the separate note by Dr. Lisbeth [name] on this date for the remainder of the  patient's plan of care.    Carola Rhine, PAC   This document serves as a record of services personally performed by Shona Simpson, PAC and Kyung Rudd, MD. It was created on their behalf by  Lendon Collar, a trained medical scribe. The creation of this record is based on the scribe's personal observations and the provider's statements to them. This document has been checked and approved by the attending provider.

## 2015-06-10 NOTE — Patient Instructions (Signed)

## 2015-06-11 NOTE — Progress Notes (Signed)
Ms. Farrugia is a very pleasant 81 y.o. 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  Biopsy results revealed the tumor's prognostic profile is ER positive, PR positive, and HER2/neu negative. Ki67 is 15%.  She presents today with her sister to the [facility]) for treatment consideration and recommendations from the breast surgeon, radiation oncologist, and medical oncologist.     I briefly met with Ms. Blouch and her sister during her [facility] visit today. We discussed the purpose of the Survivorship Clinic, which will include monitoring for recurrence, coordinating completion of age and gender-appropriate cancer screenings, promotion of overall wellness, as well as managing potential late/long-term side effects of anti-cancer treatments.    The treatment plan for Ms. Aman will likely include surgery, radiation therapy, and anti-estrogen therapy. As of today, the intent of treatment for Ms. Chastain is cure, therefore she will be eligible for the Survivorship Clinic upon her completion of treatment.  Her survivorship care plan (SCP) document will be drafted and updated throughout the course of her treatment trajectory. She will receive the SCP in an office visit with myself in the Survivorship Clinic once she has completed treatment.   Ms. Rutan was encouraged to ask questions and all questions were answered to her satisfaction.  She was given my business card and encouraged to contact me with any concerns regarding survivorship.  I look forward to participating in her care.   Kenn File, [place] [phone]

## 2015-06-15 ENCOUNTER — Telehealth: Payer: Self-pay | Admitting: *Deleted

## 2015-06-15 ENCOUNTER — Other Ambulatory Visit: Payer: Self-pay | Admitting: Surgery

## 2015-06-15 DIAGNOSIS — C50111 Malignant neoplasm of central portion of right female breast: Secondary | ICD-10-CM

## 2015-06-15 NOTE — Telephone Encounter (Signed)
  Oncology Nurse Navigator Documentation    Navigator Encounter Type: Telephone (06/15/15 1100) Telephone: Lahoma Crocker Call;Appt Confirmation/Clarification;Clinic/MDC Follow-up (06/15/15 1100)     Surgery Date: 07/09/15 (06/15/15 1100) Treatment Initiated Date: 07/09/15 (06/15/15 1100)     Barriers/Navigation Needs: Coordination of Care (06/15/15 1100)   Interventions: Coordination of Care (06/15/15 1100)   Coordination of Care: Appts (06/15/15 1100) Dr. Lindi Adie scheduled and confirmed for 07/16/15 at 1015. Referral placed for appt with Dr. Lisbeth Renshaw                  Time Spent with Patient: 15 (06/15/15 1100)

## 2015-06-16 ENCOUNTER — Encounter: Payer: Self-pay | Admitting: Radiation Oncology

## 2015-06-21 ENCOUNTER — Other Ambulatory Visit: Payer: Self-pay | Admitting: Family Medicine

## 2015-06-26 ENCOUNTER — Other Ambulatory Visit: Payer: Self-pay | Admitting: Family Medicine

## 2015-07-02 ENCOUNTER — Encounter (HOSPITAL_BASED_OUTPATIENT_CLINIC_OR_DEPARTMENT_OTHER): Payer: Self-pay | Admitting: *Deleted

## 2015-07-03 ENCOUNTER — Encounter (HOSPITAL_BASED_OUTPATIENT_CLINIC_OR_DEPARTMENT_OTHER)
Admission: RE | Admit: 2015-07-03 | Discharge: 2015-07-03 | Disposition: A | Discharge status: Home / Self Care | Payer: No Typology Code available for payment source | Source: Ambulatory Visit | Attending: Surgery | Admitting: Surgery

## 2015-07-03 ENCOUNTER — Other Ambulatory Visit: Payer: Self-pay

## 2015-07-03 DIAGNOSIS — I10 Essential (primary) hypertension: Secondary | ICD-10-CM | POA: Insufficient documentation

## 2015-07-03 DIAGNOSIS — Z01810 Encounter for preprocedural cardiovascular examination: Secondary | ICD-10-CM | POA: Diagnosis present

## 2015-07-03 DIAGNOSIS — E119 Type 2 diabetes mellitus without complications: Secondary | ICD-10-CM | POA: Diagnosis not present

## 2015-07-03 NOTE — Progress Notes (Signed)
Ekg reviewed by Dr. Sherren Kerns, no further orders pt cleared for surgery at surgery center   Pt was given 8 oz bottle of water, given instructions to drink by 4 am,  No other fluids after midnight except bottle of water she received. Voiced understanding, instructions slip attached to bottle for pt to refer to.

## 2015-07-08 ENCOUNTER — Ambulatory Visit
Admission: RE | Admit: 2015-07-08 | Discharge: 2015-07-08 | Disposition: A | Discharge status: Home / Self Care | Payer: No Typology Code available for payment source | Source: Ambulatory Visit | Attending: Surgery | Admitting: Surgery

## 2015-07-08 DIAGNOSIS — C50111 Malignant neoplasm of central portion of right female breast: Secondary | ICD-10-CM

## 2015-07-08 NOTE — Anesthesia Preprocedure Evaluation (Addendum)
Anesthesia Evaluation  Patient identified by MRN, date of birth, ID band Patient awake    Reviewed: Allergy & Precautions, NPO status , Patient's Chart, lab work & pertinent test results  Airway Mallampati: I  TM Distance: >3 FB Neck ROM: Full    Dental  (+) Edentulous Upper   Pulmonary neg pulmonary ROS,    breath sounds clear to auscultation       Cardiovascular hypertension, Pt. on medications  Rhythm:Regular Rate:Normal     Neuro/Psych negative neurological ROS  negative psych ROS   GI/Hepatic negative GI ROS, Neg liver ROS,   Endo/Other  diabetes, Well Controlled, Type 2, Oral Hypoglycemic Agents  Renal/GU Renal InsufficiencyRenal disease  negative genitourinary   Musculoskeletal  (+) Arthritis ,   Abdominal   Peds negative pediatric ROS (+)  Hematology   Anesthesia Other Findings   Reproductive/Obstetrics negative OB ROS                            Lab Results  Component Value Date   WBC 5.6 06/10/2015   HGB 12.7 06/10/2015   HCT 39.1 06/10/2015   MCV 94.1 06/10/2015   PLT 184 06/10/2015     Anesthesia Physical Anesthesia Plan  ASA: III  Anesthesia Plan: General   Post-op Pain Management: GA combined w/ Regional for post-op pain   Induction: Intravenous  Airway Management Planned: LMA  Additional Equipment:   Intra-op Plan:   Post-operative Plan: Extubation in OR  Informed Consent: I have reviewed the patients History and Physical, chart, labs and discussed the procedure including the risks, benefits and alternatives for the proposed anesthesia with the patient or authorized representative who has indicated his/her understanding and acceptance.   Dental advisory given  Plan Discussed with: CRNA  Anesthesia Plan Comments:         Anesthesia Quick Evaluation

## 2015-07-09 ENCOUNTER — Ambulatory Visit (HOSPITAL_BASED_OUTPATIENT_CLINIC_OR_DEPARTMENT_OTHER): Payer: No Typology Code available for payment source | Admitting: Anesthesiology

## 2015-07-09 ENCOUNTER — Encounter (HOSPITAL_BASED_OUTPATIENT_CLINIC_OR_DEPARTMENT_OTHER)
Admission: RE | Disposition: A | Discharge status: Home / Self Care | Payer: Self-pay | Source: Ambulatory Visit | Attending: Surgery

## 2015-07-09 ENCOUNTER — Encounter (HOSPITAL_COMMUNITY)
Admission: RE | Admit: 2015-07-09 | Discharge: 2015-07-09 | Disposition: A | Discharge status: Home / Self Care | Payer: No Typology Code available for payment source | Source: Ambulatory Visit | Attending: Surgery | Admitting: Surgery

## 2015-07-09 ENCOUNTER — Encounter (HOSPITAL_BASED_OUTPATIENT_CLINIC_OR_DEPARTMENT_OTHER): Payer: Self-pay | Admitting: *Deleted

## 2015-07-09 ENCOUNTER — Ambulatory Visit (HOSPITAL_BASED_OUTPATIENT_CLINIC_OR_DEPARTMENT_OTHER)
Admission: RE | Admit: 2015-07-09 | Discharge: 2015-07-09 | Disposition: A | Discharge status: Home / Self Care | Payer: No Typology Code available for payment source | Source: Ambulatory Visit | Attending: Surgery | Admitting: Surgery

## 2015-07-09 ENCOUNTER — Ambulatory Visit
Admission: RE | Admit: 2015-07-09 | Discharge: 2015-07-09 | Disposition: A | Discharge status: Home / Self Care | Payer: No Typology Code available for payment source | Source: Ambulatory Visit | Attending: Surgery | Admitting: Surgery

## 2015-07-09 DIAGNOSIS — N6011 Diffuse cystic mastopathy of right breast: Secondary | ICD-10-CM | POA: Diagnosis not present

## 2015-07-09 DIAGNOSIS — Z7984 Long term (current) use of oral hypoglycemic drugs: Secondary | ICD-10-CM | POA: Insufficient documentation

## 2015-07-09 DIAGNOSIS — I10 Essential (primary) hypertension: Secondary | ICD-10-CM | POA: Diagnosis not present

## 2015-07-09 DIAGNOSIS — Z90710 Acquired absence of both cervix and uterus: Secondary | ICD-10-CM | POA: Insufficient documentation

## 2015-07-09 DIAGNOSIS — E119 Type 2 diabetes mellitus without complications: Secondary | ICD-10-CM | POA: Diagnosis not present

## 2015-07-09 DIAGNOSIS — M1990 Unspecified osteoarthritis, unspecified site: Secondary | ICD-10-CM | POA: Insufficient documentation

## 2015-07-09 DIAGNOSIS — E7800 Pure hypercholesterolemia, unspecified: Secondary | ICD-10-CM | POA: Diagnosis not present

## 2015-07-09 DIAGNOSIS — C50911 Malignant neoplasm of unspecified site of right female breast: Secondary | ICD-10-CM | POA: Insufficient documentation

## 2015-07-09 DIAGNOSIS — C50111 Malignant neoplasm of central portion of right female breast: Secondary | ICD-10-CM

## 2015-07-09 HISTORY — PX: RADIOACTIVE SEED GUIDED PARTIAL MASTECTOMY WITH AXILLARY SENTINEL LYMPH NODE BIOPSY: SHX6520

## 2015-07-09 LAB — GLUCOSE, CAPILLARY
Glucose-Capillary: 144 mg/dL — ABNORMAL HIGH (ref 65–99)
Glucose-Capillary: 179 mg/dL — ABNORMAL HIGH (ref 65–99)

## 2015-07-09 SURGERY — RADIOACTIVE SEED GUIDED PARTIAL MASTECTOMY WITH AXILLARY SENTINEL LYMPH NODE BIOPSY
Anesthesia: General | Site: Breast | Laterality: Right

## 2015-07-09 MED ORDER — ONDANSETRON 4 MG PO TBDP: 4.0000 mg | ORAL_TABLET | Freq: Three times a day (TID) | ORAL | Status: DC | PRN

## 2015-07-09 MED ORDER — MIDAZOLAM HCL 2 MG/2ML IJ SOLN: 1.0000 mg | INTRAMUSCULAR | Status: DC | PRN

## 2015-07-09 MED ORDER — TECHNETIUM TC 99M SULFUR COLLOID FILTERED
1.0000 | Freq: Once | INTRAVENOUS | Status: AC | PRN
  Administered 2015-07-09: 1 via INTRADERMAL

## 2015-07-09 MED ORDER — OXYCODONE HCL 5 MG/5ML PO SOLN: 5.0000 mg | Freq: Once | ORAL | Status: DC | PRN

## 2015-07-09 MED ORDER — CHLORHEXIDINE GLUCONATE 4 % EX LIQD: 1.0000 "application " | Freq: Once | CUTANEOUS | Status: DC

## 2015-07-09 MED ORDER — PROPOFOL 10 MG/ML IV BOLUS
INTRAVENOUS | Status: DC | PRN
  Administered 2015-07-09: 150 mg via INTRAVENOUS
  Administered 2015-07-09: 50 mg via INTRAVENOUS

## 2015-07-09 MED ORDER — HYDROMORPHONE HCL 1 MG/ML IJ SOLN
INTRAMUSCULAR | Status: AC
  Filled 2015-07-09: qty 1

## 2015-07-09 MED ORDER — HEPARIN (PORCINE) IN NACL 2-0.9 UNIT/ML-% IJ SOLN
INTRAMUSCULAR | Status: AC
  Filled 2015-07-09: qty 500

## 2015-07-09 MED ORDER — SCOPOLAMINE 1 MG/3DAYS TD PT72: 1.0000 | MEDICATED_PATCH | Freq: Once | TRANSDERMAL | Status: DC | PRN

## 2015-07-09 MED ORDER — LACTATED RINGERS IV SOLN: INTRAVENOUS | Status: DC

## 2015-07-09 MED ORDER — ONDANSETRON HCL 4 MG/2ML IJ SOLN
INTRAMUSCULAR | Status: AC
  Filled 2015-07-09: qty 2

## 2015-07-09 MED ORDER — FENTANYL CITRATE (PF) 100 MCG/2ML IJ SOLN
50.0000 ug | INTRAMUSCULAR | Status: DC | PRN
  Administered 2015-07-09 (×2): 50 ug via INTRAVENOUS

## 2015-07-09 MED ORDER — PROPOFOL 500 MG/50ML IV EMUL
INTRAVENOUS | Status: AC
  Filled 2015-07-09: qty 50

## 2015-07-09 MED ORDER — MEPERIDINE HCL 25 MG/ML IJ SOLN: 6.2500 mg | INTRAMUSCULAR | Status: DC | PRN

## 2015-07-09 MED ORDER — HYDROCODONE-ACETAMINOPHEN 5-325 MG PO TABS: 1.0000 | ORAL_TABLET | Freq: Four times a day (QID) | ORAL | Status: DC | PRN

## 2015-07-09 MED ORDER — BUPIVACAINE-EPINEPHRINE (PF) 0.5% -1:200000 IJ SOLN
INTRAMUSCULAR | Status: DC | PRN
  Administered 2015-07-09: 25 mL via PERINEURAL

## 2015-07-09 MED ORDER — METHYLENE BLUE 0.5 % INJ SOLN
INTRAVENOUS | Status: AC
  Filled 2015-07-09: qty 30

## 2015-07-09 MED ORDER — TRAMADOL HCL 50 MG PO TABS: 50.0000 mg | ORAL_TABLET | Freq: Four times a day (QID) | ORAL | Status: DC | PRN

## 2015-07-09 MED ORDER — LACTATED RINGERS IV SOLN
INTRAVENOUS | Status: DC
  Administered 2015-07-09 (×2): via INTRAVENOUS

## 2015-07-09 MED ORDER — HEPARIN SOD (PORK) LOCK FLUSH 100 UNIT/ML IV SOLN
INTRAVENOUS | Status: AC
  Filled 2015-07-09: qty 5

## 2015-07-09 MED ORDER — DEXAMETHASONE SODIUM PHOSPHATE 4 MG/ML IJ SOLN
INTRAMUSCULAR | Status: DC | PRN
  Administered 2015-07-09: 5 mg via INTRAVENOUS

## 2015-07-09 MED ORDER — SODIUM CHLORIDE 0.9 % IJ SOLN
INTRAMUSCULAR | Status: AC
  Filled 2015-07-09: qty 30

## 2015-07-09 MED ORDER — LIDOCAINE HCL (CARDIAC) 20 MG/ML IV SOLN
INTRAVENOUS | Status: DC | PRN
  Administered 2015-07-09: 80 mg via INTRAVENOUS

## 2015-07-09 MED ORDER — GLYCOPYRROLATE 0.2 MG/ML IJ SOLN: 0.2000 mg | Freq: Once | INTRAMUSCULAR | Status: DC | PRN

## 2015-07-09 MED ORDER — BUPIVACAINE-EPINEPHRINE (PF) 0.25% -1:200000 IJ SOLN
INTRAMUSCULAR | Status: DC | PRN
  Administered 2015-07-09: 10 mL

## 2015-07-09 MED ORDER — SODIUM CHLORIDE 0.9 % IJ SOLN
INTRAVENOUS | Status: DC | PRN
  Administered 2015-07-09: 5 mL

## 2015-07-09 MED ORDER — FENTANYL CITRATE (PF) 100 MCG/2ML IJ SOLN
INTRAMUSCULAR | Status: AC
  Filled 2015-07-09: qty 2

## 2015-07-09 MED ORDER — FENTANYL CITRATE (PF) 100 MCG/2ML IJ SOLN: 25.0000 ug | INTRAMUSCULAR | Status: DC | PRN

## 2015-07-09 MED ORDER — BUPIVACAINE-EPINEPHRINE (PF) 0.25% -1:200000 IJ SOLN
INTRAMUSCULAR | Status: AC
  Filled 2015-07-09: qty 150

## 2015-07-09 MED ORDER — HYDROMORPHONE HCL 1 MG/ML IJ SOLN
0.2500 mg | INTRAMUSCULAR | Status: DC | PRN
  Administered 2015-07-09 (×4): 0.5 mg via INTRAVENOUS

## 2015-07-09 MED ORDER — OXYCODONE HCL 5 MG PO TABS: 5.0000 mg | ORAL_TABLET | Freq: Once | ORAL | Status: DC | PRN

## 2015-07-09 MED ORDER — ONDANSETRON HCL 4 MG/2ML IJ SOLN
INTRAMUSCULAR | Status: DC | PRN
  Administered 2015-07-09: 4 mg via INTRAVENOUS

## 2015-07-09 MED ORDER — LIDOCAINE 2% (20 MG/ML) 5 ML SYRINGE
INTRAMUSCULAR | Status: AC
  Filled 2015-07-09: qty 5

## 2015-07-09 MED ORDER — PROMETHAZINE HCL 25 MG/ML IJ SOLN: 6.2500 mg | INTRAMUSCULAR | Status: DC | PRN

## 2015-07-09 MED ORDER — MIDAZOLAM HCL 2 MG/2ML IJ SOLN
INTRAMUSCULAR | Status: AC
  Filled 2015-07-09: qty 2

## 2015-07-09 MED ORDER — CLINDAMYCIN PHOSPHATE 300 MG/50ML IV SOLN
300.0000 mg | Freq: Once | INTRAVENOUS | Status: AC
  Administered 2015-07-09: 300 mg via INTRAVENOUS

## 2015-07-09 MED ORDER — DEXAMETHASONE SODIUM PHOSPHATE 10 MG/ML IJ SOLN
INTRAMUSCULAR | Status: AC
  Filled 2015-07-09: qty 1

## 2015-07-09 MED ORDER — CLINDAMYCIN PHOSPHATE 300 MG/50ML IV SOLN
INTRAVENOUS | Status: AC
  Filled 2015-07-09: qty 50

## 2015-07-09 MED ORDER — PHENYLEPHRINE HCL 10 MG/ML IJ SOLN
INTRAMUSCULAR | Status: DC | PRN
  Administered 2015-07-09: 80 ug via INTRAVENOUS

## 2015-07-09 SURGICAL SUPPLY — 55 items
APPLIER CLIP 9.375 MED OPEN (MISCELLANEOUS) ×3
BINDER BREAST LRG (GAUZE/BANDAGES/DRESSINGS) IMPLANT
BINDER BREAST MEDIUM (GAUZE/BANDAGES/DRESSINGS) IMPLANT
BINDER BREAST XLRG (GAUZE/BANDAGES/DRESSINGS) ×3 IMPLANT
BINDER BREAST XXLRG (GAUZE/BANDAGES/DRESSINGS) IMPLANT
BLADE CLIPPER SURG (BLADE) ×3 IMPLANT
BLADE SURG 15 STRL LF DISP TIS (BLADE) ×1 IMPLANT
BLADE SURG 15 STRL SS (BLADE) ×2
CANISTER SUC SOCK COL 7IN (MISCELLANEOUS) IMPLANT
CANISTER SUCT 1200ML W/VALVE (MISCELLANEOUS) ×3 IMPLANT
CHLORAPREP W/TINT 26ML (MISCELLANEOUS) ×3 IMPLANT
CLIP APPLIE 9.375 MED OPEN (MISCELLANEOUS) ×1 IMPLANT
COVER BACK TABLE 60X90IN (DRAPES) ×3 IMPLANT
COVER MAYO STAND STRL (DRAPES) ×3 IMPLANT
COVER PROBE W GEL 5X96 (DRAPES) ×3 IMPLANT
DECANTER SPIKE VIAL GLASS SM (MISCELLANEOUS) IMPLANT
DEVICE DUBIN W/COMP PLATE 8390 (MISCELLANEOUS) ×3 IMPLANT
DRAIN CHANNEL 19F RND (DRAIN) ×3 IMPLANT
DRAPE LAPAROSCOPIC ABDOMINAL (DRAPES) ×3 IMPLANT
DRAPE UTILITY XL STRL (DRAPES) ×3 IMPLANT
ELECT COATED BLADE 2.86 ST (ELECTRODE) ×3 IMPLANT
ELECT REM PT RETURN 9FT ADLT (ELECTROSURGICAL) ×3
ELECTRODE REM PT RTRN 9FT ADLT (ELECTROSURGICAL) ×1 IMPLANT
EVACUATOR SILICONE 100CC (DRAIN) ×3 IMPLANT
GLOVE BIOGEL PI IND STRL 7.0 (GLOVE) ×1 IMPLANT
GLOVE BIOGEL PI IND STRL 8 (GLOVE) ×1 IMPLANT
GLOVE BIOGEL PI INDICATOR 7.0 (GLOVE) ×2
GLOVE BIOGEL PI INDICATOR 8 (GLOVE) ×2
GLOVE ECLIPSE 8.0 STRL XLNG CF (GLOVE) ×3 IMPLANT
GLOVE EXAM NITRILE EXT CUFF MD (GLOVE) ×3 IMPLANT
GLOVE SURG SS PI 6.5 STRL IVOR (GLOVE) ×3 IMPLANT
GOWN STRL REUS W/ TWL LRG LVL3 (GOWN DISPOSABLE) ×2 IMPLANT
GOWN STRL REUS W/TWL LRG LVL3 (GOWN DISPOSABLE) ×4
HEMOSTAT SNOW SURGICEL 2X4 (HEMOSTASIS) IMPLANT
KIT MARKER MARGIN INK (KITS) ×3 IMPLANT
LIQUID BAND (GAUZE/BANDAGES/DRESSINGS) ×3 IMPLANT
NDL SAFETY ECLIPSE 18X1.5 (NEEDLE) IMPLANT
NEEDLE HYPO 18GX1.5 SHARP (NEEDLE)
NEEDLE HYPO 25X1 1.5 SAFETY (NEEDLE) ×6 IMPLANT
NS IRRIG 1000ML POUR BTL (IV SOLUTION) ×3 IMPLANT
PACK BASIN DAY SURGERY FS (CUSTOM PROCEDURE TRAY) ×3 IMPLANT
PENCIL BUTTON HOLSTER BLD 10FT (ELECTRODE) ×3 IMPLANT
PIN SAFETY STERILE (MISCELLANEOUS) ×3 IMPLANT
SLEEVE SCD COMPRESS KNEE MED (MISCELLANEOUS) ×3 IMPLANT
SPONGE GAUZE 4X4 12PLY STER LF (GAUZE/BANDAGES/DRESSINGS) ×3 IMPLANT
SPONGE LAP 4X18 X RAY DECT (DISPOSABLE) ×3 IMPLANT
SUT ETHILON 2 0 FS 18 (SUTURE) ×3 IMPLANT
SUT MNCRL AB 4-0 PS2 18 (SUTURE) ×3 IMPLANT
SUT VICRYL 3-0 CR8 SH (SUTURE) ×3 IMPLANT
SYR CONTROL 10ML LL (SYRINGE) ×6 IMPLANT
TOWEL OR 17X24 6PK STRL BLUE (TOWEL DISPOSABLE) ×3 IMPLANT
TOWEL OR NON WOVEN STRL DISP B (DISPOSABLE) IMPLANT
TUBE CONNECTING 20'X1/4 (TUBING) ×1
TUBE CONNECTING 20X1/4 (TUBING) ×2 IMPLANT
YANKAUER SUCT BULB TIP NO VENT (SUCTIONS) ×3 IMPLANT

## 2015-07-09 NOTE — Discharge Instructions (Signed)
[facility] °Office Phone Number [phone] ° °BREAST BIOPSY/ PARTIAL MASTECTOMY: POST OP INSTRUCTIONS ° °Always review your discharge instruction sheet given to you by the facility where your surgery was performed. ° °IF YOU HAVE DISABILITY OR FAMILY LEAVE FORMS, YOU MUST BRING THEM TO THE OFFICE FOR PROCESSING.  DO NOT GIVE THEM TO YOUR DOCTOR. ° °1. A prescription for pain medication may be given to you upon discharge.  Take your pain medication as prescribed, if needed.  If narcotic pain medicine is not needed, then you may take acetaminophen (Tylenol) or ibuprofen (Advil) as needed. °2. Take your usually prescribed medications unless otherwise directed °3. If you need a refill on your pain medication, please contact your pharmacy.  They will contact our office to request authorization.  Prescriptions will not be filled after 5pm or on week-ends. °4. You should eat very light the first 24 hours after surgery, such as soup, crackers, pudding, etc.  Resume your normal diet the day after surgery. °5. Most patients will experience some swelling and bruising in the breast.  Ice packs and a good support bra will help.  Swelling and bruising can take several days to resolve.  °6. It is common to experience some constipation if taking pain medication after surgery.  Increasing fluid intake and taking a stool softener will usually help or prevent this problem from occurring.  A mild laxative (Milk of Magnesia or Miralax) should be taken according to package directions if there are no bowel movements after 48 hours. °7. Unless discharge instructions indicate otherwise, you may remove your bandages 24-48 hours after surgery, and you may shower at that time.  You may have steri-strips (small skin tapes) in place directly over the incision.  These strips should be left on the skin for 7-10 days.  If your surgeon used skin glue on the incision, you may shower in 24 hours.  The glue will flake off over the  next 2-3 weeks.  Any sutures or staples will be removed at the office during your follow-up visit. °8. ACTIVITIES:  You may resume regular daily activities (gradually increasing) beginning the next day.  Wearing a good support bra or sports bra minimizes pain and swelling.  You may have sexual intercourse when it is comfortable. °a. You may drive when you no longer are taking prescription pain medication, you can comfortably wear a seatbelt, and you can safely maneuver your car and apply brakes. °b. RETURN TO WORK:  ______________________________________________________________________________________ °9. You should see your doctor in the office for a follow-up appointment approximately two weeks after your surgery.  Your doctor’s nurse will typically make your follow-up appointment when she calls you with your pathology report.  Expect your pathology report 2-3 business days after your surgery.  You may call to check if you do not hear from us after three days. °10. OTHER INSTRUCTIONS: _______________________________________________________________________________________________ _____________________________________________________________________________________________________________________________________ °_____________________________________________________________________________________________________________________________________ °_____________________________________________________________________________________________________________________________________ ° °WHEN TO CALL YOUR DOCTOR: °1. Fever over 101.0 °2. Nausea and/or vomiting. °3. Extreme swelling or bruising. °4. Continued bleeding from incision. °5. Increased pain, redness, or drainage from the incision. ° °The clinic staff is available to answer your questions during regular business hours.  Please don’t hesitate to call and ask to speak to one of the nurses for clinical concerns.  If you have a medical emergency, go to the nearest  emergency room or call 911.  A surgeon from [facility] is always on call at the hospital. ° °For further questions, please visit [email or web address]  ° ° °  Bulb Drain Home Care A bulb drain consists of a thin rubber tube and a soft, round bulb that creates a gentle suction. The rubber tube is placed in the area where you had surgery. A bulb is attached to the end of the tube that is outside the body. The bulb drain removes excess fluid that normally builds up in a surgical wound after surgery. The color and amount of fluid will vary. Immediately after surgery, the fluid is bright red and is a little thicker than water. It may gradually change to a yellow or pink color and become more thin and water-like. When the amount decreases to about 1 or 2 tbsp in 24 hours, your health care provider will usually remove it. DAILY CARE 1. Keep the bulb flat (compressed) at all times, except while emptying it. The flatness creates suction. You can flatten the bulb by squeezing it firmly in the middle and then closing the cap. 2. Keep sites where the tube enters the skin dry and covered with a bandage (dressing). 3. Secure the tube 1-2 in (2.5-5.1 cm) below the insertion sites to keep it from pulling on your stitches. The tube is stitched in place and will not slip out. 4. Secure the bulb as directed by your health care provider. 5. For the first 3 days after surgery, there usually is more fluid in the bulb. Empty the bulb whenever it becomes half full because the bulb does not create enough suction if it is too full. The bulb could also overflow. Write down how much fluid you remove each time you empty your drain. Add up the amount removed in 24 hours. 6. Empty the bulb at the same time every day once the amount of fluid decreases and you only need to empty it once a day. Write down the amounts and the 24-hour totals to give to your health care provider. This helps your health care provider know when  the tubes can be removed. EMPTYING THE BULB DRAIN Before emptying the bulb, get a measuring cup, a piece of paper and a pen, and wash your hands.  Gently run your fingers down the tube (stripping) to empty any drainage from the tubing into the bulb. This may need to be done several times a day to clear the tubing of clots and tissue.  Open the bulb cap to release suction, which causes it to inflate. Do not touch the inside of the cap.  Gently run your fingers down the tube (stripping) to empty any drainage from the tubing into the bulb.  Hold the cap out of the way, and pour fluid into the measuring cup.   Squeeze the bulb to provide suction.  Replace the cap.   Check the tape that holds the tube to your skin. If it is becoming loose, you can remove the loose piece of tape and apply a new one. Then, pin the bulb to your shirt.   Write down the amount of fluid you emptied out. Write down the date and each time you emptied your bulb drain. (If there are 2 bulbs, note the amount of drainage from each bulb and keep the totals separate. Your health care provider will want to know the total amounts for each drain and which tube is draining more.)   Flush the fluid down the toilet and wash your hands.   Call your health care provider once you have less than 2 tbsp of fluid collecting in the bulb drain every 24 hours. If  there is drainage around the tube site, change dressings and keep the area dry. Cleanse around tube with sterile saline and place dry gauze around site. This gauze should be changed when it is soiled. If it stays clean and unsoiled, it should still be changed daily.  SEEK MEDICAL CARE IF:  Your drainage has a bad smell or is cloudy.   You have a fever.   Your drainage is increasing instead of decreasing.   Your tube fell out.   You have redness or swelling around the tube site.   You have drainage from a surgical wound.   Your bulb drain will not stay flat  after you empty it.  MAKE SURE YOU:   Understand these instructions.  Will watch your condition.  Will get help right away if you are not doing well or get worse.   This information is not intended to replace advice given to you by your health care provider. Make sure you discuss any questions you have with your health care provider.   Document Released: 02/05/2000 Document Revised: 02/28/2014 Document Reviewed: 08/27/2014 Elsevier Interactive Patient Education 2016 [place] Anesthesia Blocks  1. Numbness or the inability to move the "blocked" extremity may last from 3-48 hours after placement. The length of time depends on the medication injected and your individual response to the medication. If the numbness is not going away after 48 hours, call your surgeon.  2. The extremity that is blocked will need to be protected until the numbness is gone and the  Strength has returned. Because you cannot feel it, you will need to take extra care to avoid injury. Because it may be weak, you may have difficulty moving it or using it. You may not know what position it is in without looking at it while the block is in effect.  3. For blocks in the legs and feet, returning to weight bearing and walking needs to be done carefully. You will need to wait until the numbness is entirely gone and the strength has returned. You should be able to move your leg and foot normally before you try and bear weight or walk. You will need someone to be with you when you first try to ensure you do not fall and possibly risk injury.  4. Bruising and tenderness at the needle site are common side effects and will resolve in a few days.  5. Persistent numbness or new problems with movement should be communicated to the surgeon or the [facility] [phone] [place] [phone]).   Post Anesthesia Home Care Instructions  Activity: Get plenty of rest for the remainder  of the day. A responsible adult should stay with you for 24 hours following the procedure.  For the next 24 hours, DO NOT: -Drive a car -Paediatric nurse -Drink alcoholic beverages -Take any medication unless instructed by your physician -Make any legal decisions or sign important papers.  Meals: Start with liquid foods such as gelatin or soup. Progress to regular foods as tolerated. Avoid greasy, spicy, heavy foods. If nausea and/or vomiting occur, drink only clear liquids until the nausea and/or vomiting subsides. Call your physician if vomiting continues.  Special Instructions/Symptoms: Your throat may feel dry or sore from the anesthesia or the breathing tube placed in your throat during surgery. If this causes discomfort, gargle with warm salt water. The discomfort should disappear within 24 hours.  If you had a scopolamine patch placed behind your ear for  the management of post- operative nausea and/or vomiting:  1. The medication in the patch is effective for 72 hours, after which it should be removed.  Wrap patch in a tissue and discard in the trash. Wash hands thoroughly with soap and water. 2. You may remove the patch earlier than 72 hours if you experience unpleasant side effects which may include dry mouth, dizziness or visual disturbances. 3. Avoid touching the patch. Wash your hands with soap and water after contact with the patch.   About my Jackson-Pratt Bulb Drain  What is a Jackson-Pratt bulb? A Jackson-Pratt is a soft, round device used to collect drainage. It is connected to a long, thin drainage catheter, which is held in place by one or two small stiches near your surgical incision site. When the bulb is squeezed, it forms a vacuum, forcing the drainage to empty into the bulb.  Emptying the Jackson-Pratt bulb- To empty the bulb: 1. Release the plug on the top of the bulb. 2. Pour the bulb's contents into a measuring container which your nurse will provide. 3.  Record the time emptied and amount of drainage. Empty the drain(s) as often as your     doctor or nurse recommends.  Date                  Time                    Amount (Drain 1)                 Amount (Drain 2)  _____________________________________________________________________  _____________________________________________________________________  _____________________________________________________________________  _____________________________________________________________________  _____________________________________________________________________  _____________________________________________________________________  _____________________________________________________________________  _____________________________________________________________________  Squeezing the Jackson-Pratt Bulb- To squeeze the bulb: 1. Make sure the plug at the top of the bulb is open. 2. Squeeze the bulb tightly in your fist. You will hear air squeezing from the bulb. 3. Replace the plug while the bulb is squeezed. 4. Use a safety pin to attach the bulb to your clothing. This will keep the catheter from     pulling at the bulb insertion site.  When to call your doctor- Call your doctor if:  Drain site becomes red, swollen or hot.  You have a fever greater than 101 degrees F.  There is oozing at the drain site.  Drain falls out (apply a guaze bandage over the drain hole and secure it with tape).  Drainage increases daily not related to activity patterns. (You will usually have more drainage when you are active than when you are resting.)  Drainage has a bad odor.

## 2015-07-09 NOTE — Anesthesia Postprocedure Evaluation (Signed)
Anesthesia Post Note  Patient: [name] [name]  Procedure(s) Performed: Procedure(s) (LRB): RADIOACTIVE SEED GUIDED PARTIAL MASTECTOMY WITH AXILLARY SENTINEL LYMPH NODE BIOPSY (Right)  Patient location during evaluation: PACU Anesthesia Type: General Level of consciousness: awake and alert Pain management: pain level controlled Vital Signs Assessment: post-procedure vital signs reviewed and stable Respiratory status: spontaneous breathing, nonlabored ventilation and respiratory function stable Cardiovascular status: blood pressure returned to baseline and stable Postop Assessment: no signs of nausea or vomiting Anesthetic complications: no    Last Vitals:  Filed Vitals:   07/09/15 1007 07/09/15 1106  BP:  150/74  Pulse: 71 70  Temp:  36.6 C  Resp: 10 18    Last Pain:  Filed Vitals:   07/09/15 1108  PainSc: 0-No pain                 Corene Resnick A

## 2015-07-09 NOTE — Anesthesia Procedure Notes (Addendum)
Anesthesia Regional Block:  Pectoralis block  Pre-Anesthetic Checklist: ,, timeout performed, Correct Patient, Correct Site, Correct Laterality, Correct Procedure, Correct Position, site marked, Risks and benefits discussed,  Surgical consent,  Pre-op evaluation,  At surgeon's request and post-op pain management  Laterality: Right and Upper  Prep: chloraprep       Needles:  Injection technique: Single-shot  Needle Type: Echogenic Needle     Needle Length: 9cm 9 cm Needle Gauge: 21 and 21 G    Additional Needles:  Procedures: ultrasound guided (picture in chart) Pectoralis block Narrative:  Start time: 07/09/2015 6:51 AM End time: 07/09/2015 6:56 AM Injection made incrementally with aspirations every 5 mL.  Performed by: Personally  Anesthesiologist: CREWS, DAVID   Procedure Name: Intubation Date/Time: 07/09/2015 7:33 AM Performed by: Maryella Shivers Pre-anesthesia Checklist: Patient identified, Emergency Drugs available, Suction available and Patient being monitored Patient Re-evaluated:Patient Re-evaluated prior to inductionOxygen Delivery Method: Circle System Utilized Preoxygenation: Pre-oxygenation with 100% oxygen Intubation Type: IV induction Ventilation: Mask ventilation without difficulty Laryngoscope Size: Mac and 3 Tube type: Oral Number of attempts: 1 Airway Equipment and Method: Stylet and Oral airway Placement Confirmation: ETT inserted through vocal cords under direct vision,  positive ETCO2 and breath sounds checked- equal and bilateral Secured at: 21 cm Tube secured with: Tape Dental Injury: Teeth and Oropharynx as per pre-operative assessment       Right PEC block image

## 2015-07-09 NOTE — Interval H&P Note (Signed)
History and Physical Interval Note:  07/09/2015 7:11 AM  [name] [name]  has presented today for surgery, with the diagnosis of RIGHT BREAST CANCER  The various methods of treatment have been discussed with the patient and family. After consideration of risks, benefits and other options for treatment, the patient has consented to  Procedure(s): RADIOACTIVE SEED GUIDED PARTIAL MASTECTOMY WITH AXILLARY SENTINEL LYMPH NODE BIOPSY (Right) as a surgical intervention .  The patient's history has been reviewed, patient examined, no change in status, stable for surgery.  I have reviewed the patient's chart and labs.  Questions were answered to the patient's satisfaction.     [name]

## 2015-07-09 NOTE — H&P (View-Only) (Signed)
[name] [name] 06/10/2015 7:57 AM Location: [facility] Patient #: [id number] DOB: [birth year] Undefined / Language: [name] [name] / Race: Black or African American Female  History of Present Illness [name] Moores A. Elton Catalano MD; 06/10/2015 2:59 PM) Patient words: asked to see the patient at the request of Dr. 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      CLINICAL DATA: Patient has a palpable abnormality in the upper outer right breast. This is her first mammogram. EXAM: 2D DIGITAL DIAGNOSTIC BILATERAL MAMMOGRAM WITH CAD AND ADJUNCT TOMO ULTRASOUND RIGHT BREAST COMPARISON: None ACR Breast Density Category b: There are scattered areas of fibroglandular density. FINDINGS: There is a focal area of opacity as well as architectural distortion and a few fine calcifications in the upper outer right breast corresponding to the area of the palpable abnormality. There is a smooth oval mass in the medial right breast. No left breast masses. No suspicious calcifications on the left or left architectural distortion. Mammographic images were processed with CAD. On physical exam, there is a firm fixed mass in the upper outer right breast. Targeted ultrasound is performed, showing an irregular focal masslike area of shadowing in the right breast at 10 o'clock measuring 19 x 10 x 14 mm. This corresponds to the palpable abnormality and mammographic abnormalities. In the 2:30 o'clock position of the right breast, 2 cm the nipple, there is an oval cyst measuring 6 x 4 x 3 mm corresponding to the benign-appearing mammographic mass. Sonographic evaluation of the right axilla shows no enlarged  or abnormal lymph nodes. IMPRESSION: 1. Irregular mass in the upper outer right breast with associated fine calcifications and architectural distortion. This is highly suspicious for breast carcinoma. RECOMMENDATION: Ultrasound-guided core needle biopsy. I have discussed the findings and recommendations with the patient. Results were also provided in writing at the conclusion of the visit. If applicable, a reminder letter will be sent to the patient regarding the next appointment. BI-RADS CATEGORY 5: Highly suggestive of malignancy. Electronically Signed By: [name] [name] M.D. On: 05/26/2015 11:45           ADDITIONAL INFORMATION: FLUORESCENCE IN-SITU HYBRIDIZATION Results: HER2 - NEGATIVE RATIO OF HER2/CEP17 SIGNALS 1.61 AVERAGE HER2 COPY NUMBER PER CELL 3.05 Reference Range: NEGATIVE HER2/CEP17 Ratio <2.0 and average HER2 copy number <4.0 EQUIVOCAL HER2/CEP17 Ratio <2.0 and average HER2 copy number >=4.0 and <6.0 POSITIVE HER2/CEP17 Ratio >=2.0 or <2.0 and average HER2 copy number >=6.0 [name] Cutter MD Pathologist, Electronic Signature ( Signed 06/08/2015) PROGNOSTIC INDICATORS Results: IMMUNOHISTOCHEMICAL AND MORPHOMETRIC ANALYSIS PERFORMED MANUALLY Estrogen Receptor: 95%, POSITIVE, STRONG STAINING INTENSITY Progesterone Receptor: 80%, POSITIVE, STRONG STAINING INTENSITY Proliferation Marker Ki67: 15% REFERENCE RANGE ESTROGEN RECEPTOR NEGATIVE 0% POSITIVE =>1% REFERENCE RANGE PROGESTERONE RECEPTOR NEGATIVE 0% POSITIVE =>1% 1 of 3 FINAL for [name] [name], [name] [name] ([id number]) ADDITIONAL INFORMATION:(continued) All controls stained appropriately [name] Cutter MD Pathologist, Electronic Signature ( Signed 06/04/2015) Addendum: An E-cadherin immunohistochemical stain is performed, which is negative in the invasive and in situ components of the tumor, confirming the lobular nature of both (i.e. invasive lobular carcinoma with lobular carcinoma in situ). (RH:ecj  06/03/2015) [name] Niece MD Pathologist, Electronic Signature ( Signed 06/03/2015) FINAL DIAGNOSIS Diagnosis Breast,  right, needle core biopsy, 10:00 o'clock - INVASIVE MAMMARY CARCINOMA. - MAMMARY CARCINOMA IN SITU. - SEE COMMENT. Microscopic Comment An E-cadherin immunohistochemical stain will be performed and reported in an addendum to determine if the invasive and in situ carcinoma is ductal or lobular in nature. Although definitive grading of breast carcinoma is best done on excision, the features of the invasive tumor from the right 10 o'clock breast biopsy are compatible with a grade II breast carcinoma. Breast prognostic markers will be performed and reported in an addendum. Findings are called to the [name] [name] on 06/03/15. Dr. Lyndon [name] has seen this case in consultation with agreement. (RAH:gt, 06/03/15) [name] Niece MD Pathologist, Electronic Signature (Case signed 06/03/2015) Specimen Gross and Clinical Information Specimen Comment In formalin 2:45, extracted <2 mins Specimen(s) Obtained: Breast, right, needle core biopsy, 10:00 o'clock Specimen Clinical Information Right lump/mass suspicious for malignancy Gross Received in formalin are 3 cores of soft, tan-red tissue which measure 1.5 x 0.2 x 0.2 cm and up to 1.9 x 0.3 x 0.2 cm. Time in Formalin 2:45 pm. Submitted in toto in 1 block(s) 2 of 3 FINAL for [name] [name] [phone]) Stain(s) used in Diagnosis: The following stain(s) were used in diagnosing the case: Her2 FISH, PR-ACIS, KI-67-ACIS, ER-ACIS, E-CAD. The control(s) stained appropriately. Disclaimer Estrogen receptor (6F11), immunohistochemical stains are performed on formalin fixed, paraffin embedded tissue using a 3,3"-diaminobenzidine (DAB) chromogen and Leica Bond Autostainer System. The staining intensity of the nucleus is scored manually and is reported as the percentage of tumor cell nuclei demonstrating specific nuclear  staining. Ki-67 (MM1), immunohistochemical stains are performed on formalin fixed, paraffin embedded tissue using a 3,3"-diaminobenzidine (DAB) chromogen and Leica Bond Autostainer System. The staining intensity of the nucleus is scored manually and is reported as the percentage of tumor cell nuclei demonstrating specific nuclear staining. HER2 IQFISH pharmDX ([name] [phone]) is a direct fluorescence in-situ hybridization assay designed to quantitatively determine HER2 gene amplification in formalin-fixed, paraffin-embedded tissue specimens. It is performed at [facility] and is reported using ASCO/CAP scoring criteria published in 2013. Some of these immunohistochemical stains may have been developed and the performance characteristics determined by [facility]. Some may not have been cleared or approved by the U.S. Food and Drug Administration. The FDA has determined that such clearance or approval is not necessary. This test is used for clinical purposes. It should not be regarded as investigational or for research. This laboratory is certified under the [place] (CLIA-88) as qualified to perform high complexity clinical laboratory testing. PR progesterone receptor (16), immunohistochemical stains are performed on formalin fixed, paraffin embedded tissue using a 3,3"-diaminobenzidine (DAB) chromogen and Leica Bond Autostainer System. The staining intensity of the nucleus is scored manually and is reported as the percentage of tumor cell nuclei demonstrating specific nuclear staining. Report signed out from the following location(s) Technical Component was performed at [facility]. [address],[place] [address].[id number]., Technical component and interpretation was performed at [place], [place], [place] [address]. CLIA #: [id number], 3 of.  The patient is a  81 year old female.   Other Problems Conni Slipper, RN; 06/10/2015 7:58 AM) Arthritis Cholelithiasis Hemorrhoids High blood pressure Hypercholesterolemia  Past Surgical History Conni Slipper, RN; 06/10/2015 7:58 AM) Breast Biopsy Right. Cataract Surgery Bilateral. Gallbladder Surgery - Open Hysterectomy (not due to cancer) - Complete  Diagnostic Studies History Conni Slipper, RN; 06/10/2015 7:58 AM) Colonoscopy 5-10 years ago Mammogram  within last year Pap Smear >5 years ago  Medication History Conni Slipper, RN; 06/10/2015 7:58 AM) No Current Medications Medications Reconciled  Social History Conni Slipper, RN; 06/10/2015 7:58 AM) Caffeine use Coffee. No alcohol use No drug use Tobacco use Never smoker.  Family History Conni Slipper, RN; 06/10/2015 7:58 AM) Cancer Mother. Heart Disease Father. Heart disease in female family member before age 11  Pregnancy / Birth History Conni Slipper, RN; 06/10/2015 7:58 AM) Age at menarche 6 years. Age of menopause <47 Contraceptive History Oral contraceptives. Gravida 3 Maternal age 26-20 Para 3     Review of Systems Conni Slipper RN; 06/10/2015 7:58 AM) General Not Present- Appetite Loss, Chills, Fatigue, Fever, Night Sweats, Weight Gain and Weight Loss. Skin Not Present- Change in Wart/Mole, Dryness, Hives, Jaundice, New Lesions, Non-Healing Wounds, Rash and Ulcer. HEENT Present- Wears glasses/contact lenses. Not Present- Earache, Hearing Loss, Hoarseness, Nose Bleed, Oral Ulcers, Ringing in the Ears, Seasonal Allergies, Sinus Pain, Sore Throat, Visual Disturbances and Yellow Eyes. Respiratory Not Present- Bloody sputum, Chronic Cough, Difficulty Breathing, Snoring and Wheezing. Breast Not Present- Breast Mass, Breast Pain, Nipple Discharge and Skin Changes. Cardiovascular Not Present- Chest Pain, Difficulty Breathing Lying Down, Leg Cramps, Palpitations, Rapid Heart Rate, Shortness of Breath and Swelling of  Extremities. Gastrointestinal Present- Hemorrhoids. Not Present- Abdominal Pain, Bloating, Bloody Stool, Change in Bowel Habits, Chronic diarrhea, Constipation, Difficulty Swallowing, Excessive gas, Gets full quickly at meals, Indigestion, Nausea, Rectal Pain and Vomiting. Female Genitourinary Not Present- Frequency, Nocturia, Painful Urination, Pelvic Pain and Urgency. Musculoskeletal Present- Joint Stiffness. Not Present- Back Pain, Joint Pain, Muscle Pain, Muscle Weakness and Swelling of Extremities. Neurological Not Present- Decreased Memory, Fainting, Headaches, Numbness, Seizures, Tingling, Tremor, Trouble walking and Weakness. Psychiatric Not Present- Anxiety, Bipolar, Change in Sleep Pattern, Depression, Fearful and Frequent crying. Endocrine Not Present- Cold Intolerance, Excessive Hunger, Hair Changes, Heat Intolerance, Hot flashes and New Diabetes. Hematology Not Present- Easy Bruising, Excessive bleeding, Gland problems, HIV and Persistent Infections.   Physical Exam (Jona Erkkila A. Retina Bernardy MD; 06/10/2015 2:57 PM)  General Mental Status-Alert. General Appearance-Consistent with stated age. Hydration-Well hydrated. Voice-Normal.  Head and Neck Head-normocephalic, atraumatic with no lesions or palpable masses. Trachea-midline. Thyroid Gland Characteristics - normal size and consistency.  Chest and Lung Exam Chest and lung exam reveals -quiet, even and easy respiratory effort with no use of accessory muscles and on auscultation, normal breath sounds, no adventitious sounds and normal vocal resonance. Inspection Chest Wall - Normal. Back - normal.  Breast Note: 1 cm breast mass right upper outer quadrant mobile  Left breast normal.  Neurologic Neurologic evaluation reveals -alert and oriented x 3 with no impairment of recent or remote memory. Mental Status-Normal.  Musculoskeletal Normal Exam - Left-Upper Extremity Strength Normal and Lower Extremity  Strength Normal. Normal Exam - Right-Upper Extremity Strength Normal and Lower Extremity Strength Normal.  Lymphatic Head & Neck  General Head & Neck Lymphatics: Bilateral - Description - Normal. Axillary  General Axillary Region: Bilateral - Description - Normal. Tenderness - Non Tender.    Assessment & Plan (Morgen Linebaugh A. Arisha Gervais MD; 06/10/2015 2:59 PM)  BREAST CANCER, RIGHT (C50.911) Impression: discussed right breast partial mastectomy with sentinel lymph node mapping versus right mastectomy with reconstruction. Pros and cons of each discussed in great length.She is opted for right breast seed localized partial Risk of sentinel lymph node mapping include bleeding, infection, lymphedema, shoulder pain. stiffness, dye allergy. cosmetic deformity , blood clots, death, need for more surgery. Pt agres to proceed. Risk  of lumpectomy include bleeding, infection, seroma, more surgery, use of seed/wire, wound care, cosmetic deformity and the need for other treatments, death , blood clots, death. Pt agrees to proceed. mastectomy and right sentinel lymph node mapping.  Current Plans You are being scheduled for surgery - Our schedulers will call you.  You should hear from our office's scheduling department within 5 working days about the location, date, and time of surgery. We try to make accommodations for patient's preferences in scheduling surgery, but sometimes the OR schedule or the surgeon's schedule prevents [place] from making those accommodations.  If you have not heard from our office [phone]) in 5 working days, call the office and ask for your surgeon's nurse.  If you have other questions about your diagnosis, plan, or surgery, call the office and ask for your surgeon's nurse.  Pt Education - [facility] Breast Cancer Information Given - [facility] "Breast Journey" Package We discussed the staging and pathophysiology of breast cancer. We discussed all of the different options for treatment for  breast cancer including surgery, chemotherapy, radiation therapy, Herceptin, and antiestrogen therapy. We discussed a sentinel lymph node biopsy as she does not appear to having lymph node involvement right now. We discussed the performance of that with injection of radioactive tracer and blue dye. We discussed that she would have an incision underneath her axillary hairline. We discussed that there is a bout a 10-20% chance of having a positive node with a sentinel lymph node biopsy and we will await the permanent pathology to make any other first further decisions in terms of her treatment. One of these options might be to return to the operating room to perform an axillary lymph node dissection. We discussed about a 1-2% risk lifetime of chronic shoulder pain as well as lymphedema associated with a sentinel lymph node biopsy. We discussed the options for treatment of the breast cancer which included lumpectomy versus a mastectomy. We discussed the performance of the lumpectomy with a wire placement. We discussed a 10-20% chance of a positive margin requiring reexcision in the operating room. We also discussed that she may need radiation therapy or antiestrogen therapy or both if she undergoes lumpectomy. We discussed the mastectomy and the postoperative care for that as well. We discussed that there is no difference in her survival whether she undergoes lumpectomy with radiation therapy or antiestrogen therapy versus a mastectomy. There is a slight difference in the local recurrence rate being 3-5% with lumpectomy and about 1% with a mastectomy. We discussed the risks of operation including bleeding, infection, possible reoperation. She understands her further therapy will be based on what her stages at the time of her operation.  Pt Education - [facility] breast cancer surgery: discussed with patient and provided information. Pt Education - [facility] Breast Biopsy HCI: discussed with patient and provided information.

## 2015-07-09 NOTE — Transfer of Care (Signed)
Immediate Anesthesia Transfer of Care Note  Patient: [name] [name]  Procedure(s) Performed: Procedure(s): RADIOACTIVE SEED GUIDED PARTIAL MASTECTOMY WITH AXILLARY SENTINEL LYMPH NODE BIOPSY (Right)  Patient Location: PACU  Anesthesia Type:GA combined with regional for post-op pain  Level of Consciousness: awake, alert  and oriented  Airway & Oxygen Therapy: Patient Spontanous Breathing and Patient connected to face mask oxygen  Post-op Assessment: Report given to RN and Post -op Vital signs reviewed and stable  Post vital signs: Reviewed and stable  Last Vitals:  Filed Vitals:   07/09/15 0645 07/09/15 0700  BP:  155/73  Pulse: 83 79  Temp:    Resp:  15    Last Pain: There were no vitals filed for this visit.       Complications: No apparent anesthesia complications

## 2015-07-09 NOTE — Op Note (Deleted)
NAME:  [name] [name], [name] [name]              ACCOUNT NO.:  [id number]  MEDICAL RECORD NO.:  [id number]  LOCATION:                               FACILITY:  [facility]  PHYSICIAN:  Thomas A. Cornett, M.D.DATE OF BIRTH:  06/24/1934  DATE OF PROCEDURE:  07/09/2015 DATE OF DISCHARGE:                              OPERATIVE REPORT   PREOPERATIVE DIAGNOSIS:  Stage II right breast cancer.  POSTOPERATIVE DIAGNOSIS:  Stage II right breast cancer.  PROCEDURE:  Right breast seed localized partial mastectomy with right axillary sentinel lymph node mapping using methylene blue dye.  SURGEON:  Thomas A. Cornett, M.D.  ANESTHESIA:  General with 0.25% Sensorcaine local with epinephrine and pectoral block per anesthesia.  EBL:  Less than 50 mL.  DRAIN:  19 round drain to the axilla.  SPECIMEN:  Right breast mass with clip and seed verified by radiography and pathology and two right axillary sentinel nodes.  INDICATIONS FOR PROCEDURE:  The patient is a 81-year-old female with breast cancer.  She opted for breast conservation after being seen in the [facility] and options discussed of both mastectomy with reconstruction and lumpectomy.  Risk, benefits and alternative therapies were discussed with the patient.  Risk of bleeding, infection, seroma, cosmetic deformity, arm numbness, shoulder weakness, shoulder stiffness, lymphedema, injury to major vascular structures, injury to nerves resulting in shoulder weakness, the need for other operative procedures and/or reexcision, death, DVT, and other treatment and/or operative procedures were discussed.  She understood the above and wished to proceed.  DESCRIPTION OF PROCEDURE:  The patient was met in the holding area and questions were answered.  She was seen by Nuclear Medicine and underwent injection for sentinel lymph node mapping.  Neoprobe was used to verify proper seed location in the right breast and this was marked.  She was then taken  back to the operating room and placed supine on the OR table. After induction of general anesthesia, the right breast was prepped and draped in a sterile fashion, time-out was done.  4 mL of methylene blue dye were injected in a subareolar position and massaged for 5 minutes. Neoprobe was then used to localize the seed in the right breast upper outer quadrant.  Curvilinear incision was made after infiltration of the skin with local anesthetic.  All tissue around both the clip and seed were excised with a grossly negative margin.  The inferior margin looked close; therefore, I took additional inferior margin and sent this as a separate specimen.  The margin was oriented with ink.  Radiograph revealed both clip and seed to be in specimen.  Through the same incision, I could reach the axilla; therefore, I did the sentinel lymph node mapping with the same incision.  Neoprobe was used and two hot sentinel nodes were identified.  These were faintly blue.  Both were removed until background counts approached that of less than 10% of baseline.  Given the amount of dissection and to do though, I had to rearrange the breast tissue and mobilize breast to close down the lumpectomy cavity, which I did circumferentially for 3-4 cm.  I placed a drain in the right axilla through the sentinel lymph node site at   the base of the lumpectomy site.  This was secured with a single stitch of 2- 0 nylon to the skin.  I then closed the breast in layers with 2-0 Vicryl to the deep layer to approximate the tissue planes of the breast.  A 4-0 Monocryl was used to close the skin.  We placed a drain to suction and there was minimal cosmetic deformity upon doing this.  Liquid adhesive was placed across the incision.  All final counts were found to be correct.  Breast binder was placed.  The patient was awoke, extubated, taken to recovery in satisfactory condition.     Thomas A. Cornett, M.D.     TAC/MEDQ  D:   07/09/2015  T:  07/09/2015  Job:  [id number] 

## 2015-07-09 NOTE — Op Note (Signed)
NAME:  [name], [name]              ACCOUNT NO.:  [id number]  MEDICAL RECORD NO.:  [id number]  LOCATION:                               FACILITY:  [facility]  PHYSICIAN:  Remonia Otte A. Shaylynne Lunt, M.D.DATE OF BIRTH:  10/24/1934  DATE OF PROCEDURE:  07/09/2015 DATE OF DISCHARGE:                              OPERATIVE REPORT   PREOPERATIVE DIAGNOSIS:  Stage II right breast cancer.  POSTOPERATIVE DIAGNOSIS:  Stage II right breast cancer.  PROCEDURE:  Right breast seed localized partial mastectomy with right axillary sentinel lymph node mapping using methylene blue dye.  SURGEON:  Kennette Cuthrell A. Mouhamadou Gittleman, M.D.  ANESTHESIA:  General with 0.25% Sensorcaine local with epinephrine and pectoral block per anesthesia.  EBL:  Less than 50 mL.  DRAIN:  19 round drain to the axilla.  SPECIMEN:  Right breast mass with clip and seed verified by radiography and pathology and two right axillary sentinel nodes.  INDICATIONS FOR PROCEDURE:  The patient is a 81-year-old female with breast cancer.  She opted for breast conservation after being seen in the [facility] and options discussed of both mastectomy with reconstruction and lumpectomy.  Risk, benefits and alternative therapies were discussed with the patient.  Risk of bleeding, infection, seroma, cosmetic deformity, arm numbness, shoulder weakness, shoulder stiffness, lymphedema, injury to major vascular structures, injury to nerves resulting in shoulder weakness, the need for other operative procedures and/or reexcision, death, DVT, and other treatment and/or operative procedures were discussed.  She understood the above and wished to proceed.  DESCRIPTION OF PROCEDURE:  The patient was met in the holding area and questions were answered.  She was seen by Nuclear Medicine and underwent injection for sentinel lymph node mapping.  Neoprobe was used to verify proper seed location in the right breast and this was marked.  She was then taken  back to the operating room and placed supine on the OR table. After induction of general anesthesia, the right breast was prepped and draped in a sterile fashion, time-out was done.  4 mL of methylene blue dye were injected in a subareolar position and massaged for 5 minutes. Neoprobe was then used to localize the seed in the right breast upper outer quadrant.  Curvilinear incision was made after infiltration of the skin with local anesthetic.  All tissue around both the clip and seed were excised with a grossly negative margin.  The inferior margin looked close; therefore, I took additional inferior margin and sent this as a separate specimen.  The margin was oriented with ink.  Radiograph revealed both clip and seed to be in specimen.  Through the same incision, I could reach the axilla; therefore, I did the sentinel lymph node mapping with the same incision.  Neoprobe was used and two hot sentinel nodes were identified.  These were faintly blue.  Both were removed until background counts approached that of less than 10% of baseline.  Given the amount of dissection and to do though, I had to rearrange the breast tissue and mobilize breast to close down the lumpectomy cavity, which I did circumferentially for 3-4 cm.  I placed a drain in the right axilla through the sentinel lymph node site at   the base of the lumpectomy site.  This was secured with a single stitch of 2- 0 nylon to the skin.  I then closed the breast in layers with 2-0 Vicryl to the deep layer to approximate the tissue planes of the breast.  A 4-0 Monocryl was used to close the skin.  We placed a drain to suction and there was minimal cosmetic deformity upon doing this.  Liquid adhesive was placed across the incision.  All final counts were found to be correct.  Breast binder was placed.  The patient was awoke, extubated, taken to recovery in satisfactory condition.     Gwin Eagon A. Makenah Karas, M.D.     TAC/MEDQ  D:   07/09/2015  T:  07/09/2015  Job:  [id number] 

## 2015-07-09 NOTE — Brief Op Note (Signed)
07/09/2015  8:41 AM  PATIENT:  [name]  81 y.o. female  PRE-OPERATIVE DIAGNOSIS:  RIGHT BREAST CANCER  POST-OPERATIVE DIAGNOSIS:  RIGHT BREAST CANCER  PROCEDURE:  Procedure(s): RADIOACTIVE SEED GUIDED PARTIAL MASTECTOMY WITH AXILLARY SENTINEL LYMPH NODE BIOPSY (Right)  SURGEON:  Surgeon(s) and Role:    Erroll Luna, MD - Primary      ANESTHESIA:   local, general and pec block  EBL:  Total I/O In: 400 [I.V.:400] Out: 20 [Blood:20]  BLOOD ADMINISTERED:none  DRAINS: (19) Jackson-Pratt drain(s) with closed bulb suction in the axilla   LOCAL MEDICATIONS USED: Bupivicaine    SPECIMEN:  Source of Specimen:  right breast and axilla  DISPOSITION OF SPECIMEN:  PATHOLOGY  COUNTS:  YES  TOURNIQUET:  * No tourniquets in log *  DICTATION: .Other Dictation: Dictation Number [id number]  PLAN OF CARE: Discharge to home after PACU  PATIENT DISPOSITION:  PACU - hemodynamically stable.   Delay start of Pharmacological VTE agent (>24hrs) due to surgical blood loss or risk of bleeding: not applicable

## 2015-07-09 NOTE — Progress Notes (Signed)
Assisted Dr. Crews with right, ultrasound guided, pectoralis block. Side rails up, monitors on throughout procedure. See vital signs in flow sheet. Tolerated Procedure well. 

## 2015-07-10 ENCOUNTER — Encounter (HOSPITAL_BASED_OUTPATIENT_CLINIC_OR_DEPARTMENT_OTHER): Payer: Self-pay | Admitting: Surgery

## 2015-07-15 ENCOUNTER — Ambulatory Visit: Payer: Self-pay | Admitting: Surgery

## 2015-07-15 MED ORDER — DEXTROSE 5 % IV SOLN
300.0000 mg | Freq: Once | INTRAVENOUS | Status: AC
  Administered 2015-07-31: 300 mg via INTRAVENOUS

## 2015-07-16 ENCOUNTER — Ambulatory Visit: Payer: No Typology Code available for payment source | Admitting: Hematology and Oncology

## 2015-07-16 NOTE — Assessment & Plan Note (Signed)
Right lumpectomy 07/09/2015: Invasive lobular cancer, grade 2, 1.9 cm, LCIS, ADH, lateral margin focally positive, 0/2 lymph nodes negative, ER 95%, PR 80%, Ki-67 15%, HER-2 negative, T1cN0 stage IA  Pathology counseling: I discussed the final pathology report of the patient provided  a copy of this report. I discussed the margins as well as lymph node surgeries. We also discussed the final staging along with previously performed ER/PR and HER-2/neu testing.  Treatment plan: 1. Adjuvant radiation therapy followed by 2. Adjuvant antiestrogen therapy  Return to clinic in 3 months to start antiestrogen therapy.

## 2015-07-17 ENCOUNTER — Ambulatory Visit: Payer: Self-pay | Admitting: Surgery

## 2015-07-17 NOTE — H&P (Signed)
[name] [name] 07/17/2015 9:58 AM Location: [facility] Patient #: [id number] DOB: 1934/05/01 Undefined / Language: [name] [name] / Race: Black or African American Female  History of Present Illness [name] Moores A. Rockelle Heuerman MD; 07/17/2015 10:49 AM) Patient words: Patient returns 1 week after right breast lumpectomy and sentinel lymph node mapping. Final pathology showed invasive lobular carcinoma 1.9 cm with focal positive lateral margin superior margin and posterior margin. Her sentinel nodes are negative. She has had a drain in place and took this out today. I discussed the need for reexcision of her focal positive margins. Her daughter accompanies her today and is taking nodes. Overall she has done well from surgery. She isn't happy about reexcision.         1. Breast, lumpectomy, Right - INVASIVE LOBULAR CARCINOMA, GRADE 2, SPANNING 1.9 CM. - LOBULAR NEOPLASIA (LOBULAR CARCINOMA IN SITU). - ATYPICAL DUCTAL HYPERPLASIA. - INVASIVE CARCINOMA IS PRESENT AT THE LATERAL MARGIN (FOCAL), LESS THAN 0.1 CM OF THE SUPERIOR MARGIN (FOCAL), AND 0.2 CM FROM THE POSTERIOR MARGIN (FOCAL). - SEE ONCOLOGY TABLE. 2. Breast, excision, Right Inferior Margin - FIBROCYSTIC CHANGE AND USUAL DUCTAL HYPERPLASIA. - NO MALIGNANCY. 3. Lymph node, sentinel, biopsy, Right Axillary 1 of 4 FINAL for [name], [name] R [id number]) Diagnosis(continued) - ONE OF ONE LYMPH NODES NEGATIVE FOR MALIGNANCY (0/1). 4. Lymph node, sentinel, biopsy, Right - ONE OF ONE LYMPH NODES NEGATIVE FOR MALIGNANCY (0/1). Microscopic Comment 1. BREAST, INVASIVE TUMOR, WITH LYMPH NODES PRESENT Specimen, including laterality and lymph node sampling (sentinel, non-sentinel): Right breast lump, additional inferior margin, and right axillary sentinel lymph nodes. Procedure: Right breast lumpectomy, inferior margin excision, and right axillary sentinel lymph node biopsies. Histologic type: Invasive lobular carcinoma. Grade:  2 Tubule formation: 3 Nuclear pleomorphism: 2 Mitotic: 1 Tumor size (gross measurement): 1.9 cm Margins: Invasive, distance to closest margin: Present at lateral margin, focal. Less t.  The patient is a 81 year old female.   Allergies [name] Numbers, LPN; QA348G [id number] AM) Lisinopril *ANTIHYPERTENSIVES* Simvastatin *ANTIHYPERLIPIDEMICS* Penicillins OxyCODONE HCl *ANALGESICS - OPIOID* Pioglitazone HCl *ANTIDIABETICS*  Medication History [name] Numbers, LPN; QA348G [id number] AM) Aspirin (81MG  Tablet DR, Oral) Active. No Current Medications (Taken starting 07/17/2015) Glucosamine Chondr 1500 Complx (Oral) Active. MetFORMIN HCl (500MG  Tablet, Oral two times daily) Active. Atorvastatin Calcium (20MG  Tablet, Oral) Active. Glimepiride (4MG  Tablet, Oral) Active. Losartan Potassium (25MG  Tablet, Oral) Active. Medications Reconciled    Vitals [name] LPN; QA348G [id number] AM) 07/17/2015 9:58 AM Weight: 191.2 lb Height: 61in Height was reported by patient. Body Surface Area: 1.85 m Body Mass Index: 36.13 kg/m  Temp.: 97.68F(Oral)  Pulse: 72 (Regular)  P.OX: 97% (Room air) BP: 126/80 (Sitting, Left Arm, Standard)      Physical Exam (Amber Williard A. Dom Haverland MD; 07/17/2015 10:50 AM)  Breast Note: Right breast incision clean dry and intact. Drain removed. No significant seroma.    Assessment & Plan (Jamien Casanova A. Helyne Genther MD; 07/17/2015 10:51 AM)  POST-OPERATIVE STATE [phone]) Impression: Stage I right breast cancer invasive lobular carcinoma with 2 focal positive margins and negative sentinel lymph nodes  Discussed the need for reexcision. The patient is not sure if she wishes to proceed with this. She will talk with Dr. Lisbeth Renshaw but I explained to her there are higher recurrence rates without reexcision in this circumstance. We will tentatively set up right breast reexcision lumpectomy unless she changes her mind. I discussed the procedure  great length with the patient and her daughter today. I explained potential long-term implications and complications.  Risk of lumpectomy include bleeding, infection, seroma, more surgery, use of seed/wire, wound care, cosmetic deformity and the need for other treatments, death , blood clots, death. Pt agrees to proceed.  Current Plans Pt Education - [employer] Free Text Education/Instructions: discussed with patient and provided information.

## 2015-07-23 NOTE — Progress Notes (Signed)
Location of Breast Cancer:Right Breast  10 o'clock position upper Outer Quadrant  Histology per Pathology Report: Diagnosis 06/02/15: Breast, right, needle core biopsy, 10:00 o'clock - INVASIVE MAMMARY CARCINOMA.- MAMMARY CARCINOMA IN SITU  Receptor Status: ER(+95%), PR (+80%), Her2-neu (neg), Ki-67 15%  Did patient present with symptoms (if so, please note symptoms) or was this found on screening mammography?:   Past/Anticipated interventions by surgeon, if any: 07/31/2015: Re excision Right breast lumpectomy with Dr. Marcello Moores Cornett,MD Diagnosis 1. Breast, excision, Right reexcision- superior - BENIGN BREAST PARENCHYMA. - THERE IS NO EVIDENCE OF MALIGNANCY. - SEE COMMENT. 2. Breast, excision, Right reexcision- inferior - LOBULAR NEOPLASIA (ATYPICAL LOBULAR HYPERPLASIA). - SEE COMMENT. 3. Breast, excision, Right reexcision- medial - BENIGN BREAST PARENCHYMA. - THERE IS NO EVIDENCE OF MALIGNANCY. - SEE COMMENT. 4. Breast, excision, Right reexcision- lateral - BENIGN BREAST PARENCHYMA. - THERE IS NO EVIDENCE OF MALIGNANCY. - SEE COMMENT. 5. Breast, excision, Right reexcision- posterior - BENIGN BREAST PARENCHYMA. - THERE IS NO EVIDENCE OF MALIGNANCY. - SEE COMMENT. 6. Breast, excision, Right reexcision- anterior - BENIGN BREAST PARENCHYMA. - THERE IS NO EVIDENCE OF MALIGNANCY. - SEE COMMENT. Microscopic Comment 1. - 6. The surgical resection margin(s) of the specimens were inked and microscopically evaluated. Immunohistochemical stains performed on multiple blocks fail to highlight    07/09/15: Dr. Erroll Luna, MD, follow up 07/17/15 J/P drain removed, scheduled for re-excision  Of right lumpectomy, for 2 focal positive  margins on June 10/2015 1.Breast, lumpectomy, Right - INVASIVE LOBULAR CARCINOMA, GRADE 2, SPANNING 1.9 CM. - LOBULAR NEOPLASIA (LOBULAR CARCINOMA IN SITU).- ATYPICAL DUCTAL HYPERPLASIA. - INVASIVE CARCINOMA IS PRESENT AT THE LATERAL MARGIN (FOCAL), LESS THAN  0.1 CM OF THE SUPERIORMARGIN (FOCAL), AND 0.2 CM FROM THE POSTERIOR MARGIN (FOCAL). - SEE ONCOLOGY TABLE. 2. Breast, excision, Right Inferior Margin- FIBROCYSTIC CHANGE AND USUAL DUCTAL HYPERPLASIA.- NO MALIGNANCY. 3. Lymph node, sentinel, biopsy, Right Axillary1 of 4 FINAL for Matt, Tsion R ([id number]) Diagnosis(continued) - ONE OF ONE LYMPH NODES NEGATIVE FOR MALIGNANCY (0/1). 4. Lymph node, sentinel, biopsy, Right- ONE OF ONE LYMPH NODES NEGATIVE FOR MALIGNANCY (0/1).  Past/Anticipated interventions by medical oncology, if any: Chemotherapy :Dr. Chanetta Marshall  Seen 06/10/15,   Lymphedema issues, if any:    Pain issues, if any:   SAFETY ISSUES:  Prior radiation? NO  Pacemaker/ICD? NO  Possible current pregnancy?N/A  Is the patient on methotrexate?   Current Complaints / other details:  Widowed,  Mother Uterine cancer     Rebecca Eaton, RN 07/23/2015,11:37 AM

## 2015-07-27 ENCOUNTER — Ambulatory Visit: Payer: No Typology Code available for payment source

## 2015-07-27 ENCOUNTER — Ambulatory Visit
Admission: RE | Admit: 2015-07-27 | Discharge: 2015-07-27 | Disposition: A | Discharge status: Home / Self Care | Payer: No Typology Code available for payment source | Source: Ambulatory Visit | Attending: Radiation Oncology | Admitting: Radiation Oncology

## 2015-07-27 DIAGNOSIS — C50411 Malignant neoplasm of upper-outer quadrant of right female breast: Secondary | ICD-10-CM | POA: Insufficient documentation

## 2015-07-27 DIAGNOSIS — Z809 Family history of malignant neoplasm, unspecified: Secondary | ICD-10-CM | POA: Insufficient documentation

## 2015-07-27 DIAGNOSIS — I129 Hypertensive chronic kidney disease with stage 1 through stage 4 chronic kidney disease, or unspecified chronic kidney disease: Secondary | ICD-10-CM | POA: Insufficient documentation

## 2015-07-27 DIAGNOSIS — Z170 Estrogen receptor positive status [ER+]: Secondary | ICD-10-CM | POA: Insufficient documentation

## 2015-07-27 DIAGNOSIS — E785 Hyperlipidemia, unspecified: Secondary | ICD-10-CM | POA: Insufficient documentation

## 2015-07-27 DIAGNOSIS — Z853 Personal history of malignant neoplasm of breast: Secondary | ICD-10-CM | POA: Insufficient documentation

## 2015-07-27 DIAGNOSIS — Z510 Encounter for antineoplastic radiation therapy: Secondary | ICD-10-CM | POA: Insufficient documentation

## 2015-07-27 DIAGNOSIS — Z7984 Long term (current) use of oral hypoglycemic drugs: Secondary | ICD-10-CM | POA: Insufficient documentation

## 2015-07-27 DIAGNOSIS — N189 Chronic kidney disease, unspecified: Secondary | ICD-10-CM | POA: Insufficient documentation

## 2015-07-29 ENCOUNTER — Encounter (HOSPITAL_COMMUNITY): Payer: Self-pay | Admitting: *Deleted

## 2015-07-29 NOTE — Progress Notes (Signed)
Instructed to stop taking aspiring on 07/29/15  Instructed to stop taking diabetic medications on friday

## 2015-07-30 ENCOUNTER — Encounter (HOSPITAL_COMMUNITY): Payer: Self-pay | Admitting: Anesthesiology

## 2015-07-30 NOTE — Anesthesia Preprocedure Evaluation (Addendum)
Anesthesia Evaluation  Patient identified by MRN, date of birth, ID band Patient awake    Reviewed: Allergy & Precautions, NPO status , Patient's Chart, lab work & pertinent test results  History of Anesthesia Complications (+) PONV and history of anesthetic complications  Airway Mallampati: I  TM Distance: >3 FB Neck ROM: Full    Dental  (+) Edentulous Upper   Pulmonary neg pulmonary ROS,    breath sounds clear to auscultation       Cardiovascular hypertension, Pt. on medications  Rhythm:Regular Rate:Normal     Neuro/Psych negative neurological ROS  negative psych ROS   GI/Hepatic negative GI ROS, Neg liver ROS,   Endo/Other  diabetes, Well Controlled, Type 2, Oral Hypoglycemic AgentsHyperlipidemia Obesity Right breast Ca  Renal/GU Renal InsufficiencyRenal disease  negative genitourinary   Musculoskeletal  (+) Arthritis ,   Abdominal   Peds negative pediatric ROS (+)  Hematology   Anesthesia Other Findings   Reproductive/Obstetrics negative OB ROS                            Lab Results  Component Value Date   WBC 5.6 06/10/2015   HGB 12.7 06/10/2015   HCT 39.1 06/10/2015   MCV 94.1 06/10/2015   PLT 184 06/10/2015     Anesthesia Physical  Anesthesia Plan  ASA: III  Anesthesia Plan: General   Post-op Pain Management: GA combined w/ Regional for post-op pain   Induction: Intravenous  Airway Management Planned: LMA  Additional Equipment:   Intra-op Plan:   Post-operative Plan: Extubation in OR  Informed Consent: I have reviewed the patients History and Physical, chart, labs and discussed the procedure including the risks, benefits and alternatives for the proposed anesthesia with the patient or authorized representative who has indicated his/her understanding and acceptance.   Dental advisory given  Plan Discussed with: CRNA, Anesthesiologist and  Surgeon  Anesthesia Plan Comments:         Anesthesia Quick Evaluation

## 2015-07-31 ENCOUNTER — Ambulatory Visit (HOSPITAL_COMMUNITY): Payer: No Typology Code available for payment source | Admitting: Anesthesiology

## 2015-07-31 ENCOUNTER — Encounter (HOSPITAL_COMMUNITY)
Admission: RE | Disposition: A | Discharge status: Home / Self Care | Payer: Self-pay | Source: Ambulatory Visit | Attending: Surgery

## 2015-07-31 ENCOUNTER — Ambulatory Visit (HOSPITAL_COMMUNITY)
Admission: RE | Admit: 2015-07-31 | Discharge: 2015-07-31 | Disposition: A | Discharge status: Home / Self Care | Payer: No Typology Code available for payment source | Source: Ambulatory Visit | Attending: Surgery | Admitting: Surgery

## 2015-07-31 ENCOUNTER — Encounter (HOSPITAL_COMMUNITY): Payer: Self-pay | Admitting: Anesthesiology

## 2015-07-31 DIAGNOSIS — Z7984 Long term (current) use of oral hypoglycemic drugs: Secondary | ICD-10-CM | POA: Insufficient documentation

## 2015-07-31 DIAGNOSIS — E669 Obesity, unspecified: Secondary | ICD-10-CM | POA: Insufficient documentation

## 2015-07-31 DIAGNOSIS — I10 Essential (primary) hypertension: Secondary | ICD-10-CM | POA: Insufficient documentation

## 2015-07-31 DIAGNOSIS — Z7982 Long term (current) use of aspirin: Secondary | ICD-10-CM | POA: Insufficient documentation

## 2015-07-31 DIAGNOSIS — E119 Type 2 diabetes mellitus without complications: Secondary | ICD-10-CM | POA: Diagnosis not present

## 2015-07-31 DIAGNOSIS — E785 Hyperlipidemia, unspecified: Secondary | ICD-10-CM | POA: Insufficient documentation

## 2015-07-31 DIAGNOSIS — C50911 Malignant neoplasm of unspecified site of right female breast: Secondary | ICD-10-CM | POA: Insufficient documentation

## 2015-07-31 DIAGNOSIS — Z79899 Other long term (current) drug therapy: Secondary | ICD-10-CM | POA: Diagnosis not present

## 2015-07-31 DIAGNOSIS — Z6835 Body mass index (BMI) 35.0-35.9, adult: Secondary | ICD-10-CM | POA: Insufficient documentation

## 2015-07-31 HISTORY — PX: RE-EXCISION OF BREAST LUMPECTOMY: SHX6048

## 2015-07-31 HISTORY — DX: Other specified postprocedural states: R11.2

## 2015-07-31 HISTORY — DX: Other specified postprocedural states: Z98.890

## 2015-07-31 LAB — CBC
HEMATOCRIT: 35.6 % — AB (ref 36.0–46.0)
Hemoglobin: 11.5 g/dL — ABNORMAL LOW (ref 12.0–15.0)
MCH: 30.3 pg (ref 26.0–34.0)
MCHC: 32.3 g/dL (ref 30.0–36.0)
MCV: 93.9 fL (ref 78.0–100.0)
Platelets: 178 10*3/uL (ref 150–400)
RBC: 3.79 MIL/uL — ABNORMAL LOW (ref 3.87–5.11)
RDW: 14.1 % (ref 11.5–15.5)
WBC: 5 10*3/uL (ref 4.0–10.5)

## 2015-07-31 LAB — BASIC METABOLIC PANEL
Anion gap: 7 (ref 5–15)
BUN: 6 mg/dL (ref 6–20)
CALCIUM: 9.2 mg/dL (ref 8.9–10.3)
CO2: 26 mmol/L (ref 22–32)
CREATININE: 0.7 mg/dL (ref 0.44–1.00)
Chloride: 107 mmol/L (ref 101–111)
GFR calc non Af Amer: >60 mL/min (ref >60)
Glucose, Bld: 116 mg/dL — ABNORMAL HIGH (ref 65–99)
Potassium: 3.4 mmol/L — ABNORMAL LOW (ref 3.5–5.1)
Sodium: 140 mmol/L (ref 135–145)

## 2015-07-31 LAB — GLUCOSE, CAPILLARY: Glucose-Capillary: 118 mg/dL — ABNORMAL HIGH (ref 65–99)

## 2015-07-31 SURGERY — EXCISION, LESION, BREAST
Anesthesia: General | Site: Breast | Laterality: Right

## 2015-07-31 MED ORDER — LIDOCAINE 2% (20 MG/ML) 5 ML SYRINGE
INTRAMUSCULAR | Status: AC
  Filled 2015-07-31: qty 5

## 2015-07-31 MED ORDER — LACTATED RINGERS IV SOLN
INTRAVENOUS | Status: DC | PRN
  Administered 2015-07-31: 07:00:00 via INTRAVENOUS

## 2015-07-31 MED ORDER — CHLORHEXIDINE GLUCONATE 4 % EX LIQD: 1.0000 "application " | Freq: Once | CUTANEOUS | Status: DC

## 2015-07-31 MED ORDER — TRAMADOL HCL 50 MG PO TABS: 50.0000 mg | ORAL_TABLET | Freq: Four times a day (QID) | ORAL | Status: DC | PRN

## 2015-07-31 MED ORDER — MEPERIDINE HCL 25 MG/ML IJ SOLN: 6.2500 mg | INTRAMUSCULAR | Status: DC | PRN

## 2015-07-31 MED ORDER — METOCLOPRAMIDE HCL 5 MG/ML IJ SOLN
INTRAMUSCULAR | Status: DC | PRN
  Administered 2015-07-31: 10 mg via INTRAVENOUS

## 2015-07-31 MED ORDER — FENTANYL CITRATE (PF) 250 MCG/5ML IJ SOLN
INTRAMUSCULAR | Status: AC
  Filled 2015-07-31: qty 5

## 2015-07-31 MED ORDER — ONDANSETRON HCL 4 MG/2ML IJ SOLN
INTRAMUSCULAR | Status: AC
  Filled 2015-07-31: qty 2

## 2015-07-31 MED ORDER — ROCURONIUM BROMIDE 50 MG/5ML IV SOLN
INTRAVENOUS | Status: AC
  Filled 2015-07-31: qty 1

## 2015-07-31 MED ORDER — 0.9 % SODIUM CHLORIDE (POUR BTL) OPTIME
TOPICAL | Status: DC | PRN
  Administered 2015-07-31: 1000 mL

## 2015-07-31 MED ORDER — BUPIVACAINE HCL 0.25 % IJ SOLN
INTRAMUSCULAR | Status: DC | PRN
  Administered 2015-07-31: 10 mL

## 2015-07-31 MED ORDER — FENTANYL CITRATE (PF) 100 MCG/2ML IJ SOLN
INTRAMUSCULAR | Status: AC
  Filled 2015-07-31: qty 2

## 2015-07-31 MED ORDER — FENTANYL CITRATE (PF) 100 MCG/2ML IJ SOLN
25.0000 ug | INTRAMUSCULAR | Status: DC | PRN
  Administered 2015-07-31: 25 ug via INTRAVENOUS

## 2015-07-31 MED ORDER — METOCLOPRAMIDE HCL 5 MG/ML IJ SOLN: 10.0000 mg | Freq: Once | INTRAMUSCULAR | Status: DC | PRN

## 2015-07-31 MED ORDER — PHENYLEPHRINE HCL 10 MG/ML IJ SOLN
INTRAMUSCULAR | Status: DC | PRN
  Administered 2015-07-31 (×3): 80 ug via INTRAVENOUS

## 2015-07-31 MED ORDER — DEXAMETHASONE SODIUM PHOSPHATE 10 MG/ML IJ SOLN
INTRAMUSCULAR | Status: DC | PRN
  Administered 2015-07-31: 10 mg via INTRAVENOUS

## 2015-07-31 MED ORDER — BUPIVACAINE HCL (PF) 0.25 % IJ SOLN
INTRAMUSCULAR | Status: AC
  Filled 2015-07-31: qty 30

## 2015-07-31 MED ORDER — FENTANYL CITRATE (PF) 100 MCG/2ML IJ SOLN
INTRAMUSCULAR | Status: DC | PRN
  Administered 2015-07-31: 50 ug via INTRAVENOUS
  Administered 2015-07-31: 100 ug via INTRAVENOUS

## 2015-07-31 MED ORDER — BUPIVACAINE-EPINEPHRINE (PF) 0.5% -1:200000 IJ SOLN
INTRAMUSCULAR | Status: AC
  Filled 2015-07-31: qty 30

## 2015-07-31 MED ORDER — ONDANSETRON HCL 4 MG/2ML IJ SOLN
INTRAMUSCULAR | Status: DC | PRN
  Administered 2015-07-31: 4 mg via INTRAVENOUS

## 2015-07-31 MED ORDER — LIDOCAINE HCL (CARDIAC) 20 MG/ML IV SOLN
INTRAVENOUS | Status: DC | PRN
  Administered 2015-07-31: 60 mg via INTRAVENOUS

## 2015-07-31 MED ORDER — PROPOFOL 10 MG/ML IV BOLUS
INTRAVENOUS | Status: DC | PRN
  Administered 2015-07-31: 150 mg via INTRAVENOUS

## 2015-07-31 MED ORDER — MIDAZOLAM HCL 5 MG/5ML IJ SOLN
INTRAMUSCULAR | Status: DC | PRN
  Administered 2015-07-31: 0.5 mg via INTRAVENOUS

## 2015-07-31 MED ORDER — PROPOFOL 10 MG/ML IV BOLUS
INTRAVENOUS | Status: AC
  Filled 2015-07-31: qty 20

## 2015-07-31 MED ORDER — MIDAZOLAM HCL 2 MG/2ML IJ SOLN
INTRAMUSCULAR | Status: AC
  Filled 2015-07-31: qty 2

## 2015-07-31 SURGICAL SUPPLY — 41 items
APPLIER CLIP 9.375 MED OPEN (MISCELLANEOUS) ×3
CANISTER SUCTION 2500CC (MISCELLANEOUS) ×3 IMPLANT
CHLORAPREP W/TINT 26ML (MISCELLANEOUS) ×3 IMPLANT
CLIP APPLIE 9.375 MED OPEN (MISCELLANEOUS) ×1 IMPLANT
CONT SPEC 4OZ CLIKSEAL STRL BL (MISCELLANEOUS) ×18 IMPLANT
COVER SURGICAL LIGHT HANDLE (MISCELLANEOUS) ×3 IMPLANT
DECANTER SPIKE VIAL GLASS SM (MISCELLANEOUS) ×3 IMPLANT
DRAPE CHEST BREAST 15X10 FENES (DRAPES) ×3 IMPLANT
ELECT CAUTERY BLADE 6.4 (BLADE) ×3 IMPLANT
ELECT REM PT RETURN 9FT ADLT (ELECTROSURGICAL) ×3
ELECTRODE REM PT RTRN 9FT ADLT (ELECTROSURGICAL) ×1 IMPLANT
GAUZE SPONGE 4X4 12PLY STRL (GAUZE/BANDAGES/DRESSINGS) ×3 IMPLANT
GLOVE BIO SURGEON STRL SZ8 (GLOVE) ×3 IMPLANT
GLOVE BIOGEL PI IND STRL 8 (GLOVE) ×2 IMPLANT
GLOVE BIOGEL PI IND STRL 8.5 (GLOVE) ×1 IMPLANT
GLOVE BIOGEL PI INDICATOR 8 (GLOVE) ×4
GLOVE BIOGEL PI INDICATOR 8.5 (GLOVE) ×2
GOWN STRL REUS W/ TWL LRG LVL3 (GOWN DISPOSABLE) IMPLANT
GOWN STRL REUS W/ TWL XL LVL3 (GOWN DISPOSABLE) ×3 IMPLANT
GOWN STRL REUS W/TWL LRG LVL3 (GOWN DISPOSABLE)
GOWN STRL REUS W/TWL XL LVL3 (GOWN DISPOSABLE) ×6
KIT BASIN OR (CUSTOM PROCEDURE TRAY) ×3 IMPLANT
KIT MARKER MARGIN INK (KITS) ×3 IMPLANT
KIT ROOM TURNOVER OR (KITS) ×3 IMPLANT
LIQUID BAND (GAUZE/BANDAGES/DRESSINGS) ×3 IMPLANT
NEEDLE HYPO 25GX1X1/2 BEV (NEEDLE) ×3 IMPLANT
NS IRRIG 1000ML POUR BTL (IV SOLUTION) ×3 IMPLANT
PACK SURGICAL SETUP 50X90 (CUSTOM PROCEDURE TRAY) ×3 IMPLANT
PAD ARMBOARD 7.5X6 YLW CONV (MISCELLANEOUS) ×3 IMPLANT
PENCIL BUTTON HOLSTER BLD 10FT (ELECTRODE) ×3 IMPLANT
SPONGE LAP 4X18 X RAY DECT (DISPOSABLE) ×3 IMPLANT
SUT MNCRL AB 4-0 PS2 18 (SUTURE) ×3 IMPLANT
SUT VIC AB 3-0 SH 27 (SUTURE) ×2
SUT VIC AB 3-0 SH 27X BRD (SUTURE) ×1 IMPLANT
SYR BULB 3OZ (MISCELLANEOUS) ×3 IMPLANT
SYR CONTROL 10ML LL (SYRINGE) ×3 IMPLANT
TOWEL OR 17X24 6PK STRL BLUE (TOWEL DISPOSABLE) ×3 IMPLANT
TOWEL OR 17X26 10 PK STRL BLUE (TOWEL DISPOSABLE) ×3 IMPLANT
TUBE CONNECTING 12'X1/4 (SUCTIONS) ×1
TUBE CONNECTING 12X1/4 (SUCTIONS) ×2 IMPLANT
YANKAUER SUCT BULB TIP NO VENT (SUCTIONS) ×3 IMPLANT

## 2015-07-31 NOTE — Addendum Note (Signed)
Addendum  created 07/31/15 1533 by Kerby Less, CRNA   Modules edited: Anesthesia Events, Narrator   Narrator:  Narrator: Event Log Edited

## 2015-07-31 NOTE — Interval H&P Note (Signed)
History and Physical Interval Note:  07/31/2015 7:06 AM  [name] [name]  has presented today for surgery, with the diagnosis of RIGHT BREAST CANCER  The various methods of treatment have been discussed with the patient and family. After consideration of risks, benefits and other options for treatment, the patient has consented to  Procedure(s): RE-EXCISION OF RIGHT BREAST LUMPECTOMY (Right) as a surgical intervention .  The patient's history has been reviewed, patient examined, no change in status, stable for surgery.  I have reviewed the patient's chart and labs.  Questions were answered to the patient's satisfaction.     Strider Vallance A.

## 2015-07-31 NOTE — Anesthesia Postprocedure Evaluation (Signed)
Anesthesia Post Note  Patient: [name] [name]  Procedure(s) Performed: Procedure(s) (LRB): RE-EXCISION OF RIGHT BREAST LUMPECTOMY (Right)  Patient location during evaluation: PACU Anesthesia Type: General Level of consciousness: awake and alert and oriented Pain management: pain level controlled Vital Signs Assessment: post-procedure vital signs reviewed and stable Respiratory status: spontaneous breathing, nonlabored ventilation and respiratory function stable Cardiovascular status: blood pressure returned to baseline and stable Postop Assessment: no signs of nausea or vomiting Anesthetic complications: no    Last Vitals:  Filed Vitals:   07/31/15 0904 07/31/15 0916  BP: 137/75 143/76  Pulse: 74 72  Temp:    Resp: 16 18    Last Pain:  Filed Vitals:   07/31/15 0916  PainSc: 1                  Camaya Gannett A.

## 2015-07-31 NOTE — Transfer of Care (Signed)
Immediate Anesthesia Transfer of Care Note  Patient: [name] [name]  Procedure(s) Performed: Procedure(s): RE-EXCISION OF RIGHT BREAST LUMPECTOMY (Right)  Patient Location: PACU  Anesthesia Type:General  Level of Consciousness: awake, alert , oriented and patient cooperative  Airway & Oxygen Therapy: Patient Spontanous Breathing  Post-op Assessment: Report given to RN, Post -op Vital signs reviewed and stable and Patient moving all extremities  Post vital signs: Reviewed and stable  Last Vitals:  Filed Vitals:   07/31/15 0624  BP: 161/60  Pulse: 77  Temp: 36.7 C  Resp: 20    Last Pain: There were no vitals filed for this visit.    Patients Stated Pain Goal: 2 ([id number])  Complications: No apparent anesthesia complications

## 2015-07-31 NOTE — Discharge Instructions (Signed)
[facility] °Office Phone Number [phone] ° °BREAST BIOPSY/ PARTIAL MASTECTOMY: POST OP INSTRUCTIONS ° °Always review your discharge instruction sheet given to you by the facility where your surgery was performed. ° °IF YOU HAVE DISABILITY OR FAMILY LEAVE FORMS, YOU MUST BRING THEM TO THE OFFICE FOR PROCESSING.  DO NOT GIVE THEM TO YOUR DOCTOR. ° °1. A prescription for pain medication may be given to you upon discharge.  Take your pain medication as prescribed, if needed.  If narcotic pain medicine is not needed, then you may take acetaminophen (Tylenol) or ibuprofen (Advil) as needed. °2. Take your usually prescribed medications unless otherwise directed °3. If you need a refill on your pain medication, please contact your pharmacy.  They will contact our office to request authorization.  Prescriptions will not be filled after 5pm or on week-ends. °4. You should eat very light the first 24 hours after surgery, such as soup, crackers, pudding, etc.  Resume your normal diet the day after surgery. °5. Most patients will experience some swelling and bruising in the breast.  Ice packs and a good support bra will help.  Swelling and bruising can take several days to resolve.  °6. It is common to experience some constipation if taking pain medication after surgery.  Increasing fluid intake and taking a stool softener will usually help or prevent this problem from occurring.  A mild laxative (Milk of Magnesia or Miralax) should be taken according to package directions if there are no bowel movements after 48 hours. °7. Unless discharge instructions indicate otherwise, you may remove your bandages 24-48 hours after surgery, and you may shower at that time.  You may have steri-strips (small skin tapes) in place directly over the incision.  These strips should be left on the skin for 7-10 days.  If your surgeon used skin glue on the incision, you may shower in 24 hours.  The glue will flake off over the  next 2-3 weeks.  Any sutures or staples will be removed at the office during your follow-up visit. °8. ACTIVITIES:  You may resume regular daily activities (gradually increasing) beginning the next day.  Wearing a good support bra or sports bra minimizes pain and swelling.  You may have sexual intercourse when it is comfortable. °a. You may drive when you no longer are taking prescription pain medication, you can comfortably wear a seatbelt, and you can safely maneuver your car and apply brakes. °b. RETURN TO WORK:  ______________________________________________________________________________________ °9. You should see your doctor in the office for a follow-up appointment approximately two weeks after your surgery.  Your doctor’s nurse will typically make your follow-up appointment when she calls you with your pathology report.  Expect your pathology report 2-3 business days after your surgery.  You may call to check if you do not hear from us after three days. °10. OTHER INSTRUCTIONS: _______________________________________________________________________________________________ _____________________________________________________________________________________________________________________________________ °_____________________________________________________________________________________________________________________________________ °_____________________________________________________________________________________________________________________________________ ° °WHEN TO CALL YOUR DOCTOR: °1. Fever over 101.0 °2. Nausea and/or vomiting. °3. Extreme swelling or bruising. °4. Continued bleeding from incision. °5. Increased pain, redness, or drainage from the incision. ° °The clinic staff is available to answer your questions during regular business hours.  Please don’t hesitate to call and ask to speak to one of the nurses for clinical concerns.  If you have a medical emergency, go to the nearest  emergency room or call 911.  A surgeon from [facility] is always on call at the hospital. ° °For further questions, please visit [email or web address]  °

## 2015-07-31 NOTE — H&P (View-Only) (Signed)
Skilah Hermiz 07/17/2015 9:58 AM Location: [facility] Patient #: [id number] DOB: 11/10/1934 Undefined / Language: [name] / Race: Black or African American Female  History of Present Illness Marcello Moores A. Silva Aamodt MD; 07/17/2015 10:49 AM) Patient words: Patient returns 1 week after right breast lumpectomy and sentinel lymph node mapping. Final pathology showed invasive lobular carcinoma 1.9 cm with focal positive lateral margin superior margin and posterior margin. Her sentinel nodes are negative. She has had a drain in place and took this out today. I discussed the need for reexcision of her focal positive margins. Her daughter accompanies her today and is taking nodes. Overall she has done well from surgery. She isn't happy about reexcision.         1. Breast, lumpectomy, Right - INVASIVE LOBULAR CARCINOMA, GRADE 2, SPANNING 1.9 CM. - LOBULAR NEOPLASIA (LOBULAR CARCINOMA IN SITU). - ATYPICAL DUCTAL HYPERPLASIA. - INVASIVE CARCINOMA IS PRESENT AT THE LATERAL MARGIN (FOCAL), LESS THAN 0.1 CM OF THE SUPERIOR MARGIN (FOCAL), AND 0.2 CM FROM THE POSTERIOR MARGIN (FOCAL). - SEE ONCOLOGY TABLE. 2. Breast, excision, Right Inferior Margin - FIBROCYSTIC CHANGE AND USUAL DUCTAL HYPERPLASIA. - NO MALIGNANCY. 3. Lymph node, sentinel, biopsy, Right Axillary 1 of 4 FINAL for Tsou, Tametria R [id number]) Diagnosis(continued) - ONE OF ONE LYMPH NODES NEGATIVE FOR MALIGNANCY (0/1). 4. Lymph node, sentinel, biopsy, Right - ONE OF ONE LYMPH NODES NEGATIVE FOR MALIGNANCY (0/1). Microscopic Comment 1. BREAST, INVASIVE TUMOR, WITH LYMPH NODES PRESENT Specimen, including laterality and lymph node sampling (sentinel, non-sentinel): Right breast lump, additional inferior margin, and right axillary sentinel lymph nodes. Procedure: Right breast lumpectomy, inferior margin excision, and right axillary sentinel lymph node biopsies. Histologic type: Invasive lobular carcinoma. Grade:  2 Tubule formation: 3 Nuclear pleomorphism: 2 Mitotic: 1 Tumor size (gross measurement): 1.9 cm Margins: Invasive, distance to closest margin: Present at lateral margin, focal. Less t.  The patient is a 81 year old female.   Allergies [name], LPN; QA348G [id number] AM) Lisinopril *ANTIHYPERTENSIVES* Simvastatin *ANTIHYPERLIPIDEMICS* Penicillins OxyCODONE HCl *ANALGESICS - OPIOID* Pioglitazone HCl *ANTIDIABETICS*  Medication History [name], LPN; QA348G 075-GRM AM) Aspirin (81MG  Tablet DR, Oral) Active. No Current Medications (Taken starting 07/17/2015) Glucosamine Chondr 1500 Complx (Oral) Active. MetFORMIN HCl (500MG  Tablet, Oral two times daily) Active. Atorvastatin Calcium (20MG  Tablet, Oral) Active. Glimepiride (4MG  Tablet, Oral) Active. Losartan Potassium (25MG  Tablet, Oral) Active. Medications Reconciled    Vitals [name] LPN; QA348G [id number] AM) 07/17/2015 9:58 AM Weight: 191.2 lb Height: 61in Height was reported by patient. Body Surface Area: 1.85 m Body Mass Index: 36.13 kg/m  Temp.: 97.73F(Oral)  Pulse: 72 (Regular)  P.OX: 97% (Room air) BP: 126/80 (Sitting, Left Arm, Standard)      Physical Exam (Iolani Twilley A. Yvonna Brun MD; 07/17/2015 10:50 AM)  Breast Note: Right breast incision clean dry and intact. Drain removed. No significant seroma.    Assessment & Plan (Kiri Hinderliter A. Shaquoya Cosper MD; 07/17/2015 10:51 AM)  POST-OPERATIVE STATE [phone]) Impression: Stage I right breast cancer invasive lobular carcinoma with 2 focal positive margins and negative sentinel lymph nodes  Discussed the need for reexcision. The patient is not sure if she wishes to proceed with this. She will talk with Dr. Lisbeth Renshaw but I explained to her there are higher recurrence rates without reexcision in this circumstance. We will tentatively set up right breast reexcision lumpectomy unless she changes her mind. I discussed the procedure  great length with the patient and her daughter today. I explained potential long-term implications and complications.  Risk of lumpectomy include bleeding, infection, seroma, more surgery, use of seed/wire, wound care, cosmetic deformity and the need for other treatments, death , blood clots, death. Pt agrees to proceed.  Current Plans Pt Education - [facility] Free Text Education/Instructions: discussed with patient and provided information.

## 2015-07-31 NOTE — Anesthesia Procedure Notes (Signed)
Procedure Name: LMA Insertion Date/Time: 07/31/2015 7:33 AM Performed by: Greggory Stallion, Gerritt Galentine L Pre-anesthesia Checklist: Patient identified, Emergency Drugs available, Suction available and Patient being monitored Patient Re-evaluated:Patient Re-evaluated prior to inductionOxygen Delivery Method: Circle System Utilized Preoxygenation: Pre-oxygenation with 100% oxygen Intubation Type: IV induction Ventilation: Mask ventilation without difficulty LMA: LMA inserted LMA Size: 4.0 Number of attempts: 1 Airway Equipment and Method: Bite block Placement Confirmation: positive ETCO2 Tube secured with: Tape Dental Injury: Teeth and Oropharynx as per pre-operative assessment

## 2015-07-31 NOTE — Op Note (Signed)
Preoperative diagnosis: Right breast cancer status post excision with positive margins  Postoperative diagnosis: Same  Procedure: Reexcision right breast lumpectomy  Surgeon: Erroll Luna M.D.  Anesthesia: Gen. With 0.25 percent Marcaine plain  EBL: Minimal  Specimens: Lumpectomy cavitymargins all reexcised oriented and sent to pathology  Indications for procedure: Patient presents for reexcision right breast lumpectomy due to invasive lobular carcinoma of the right breast. She had multiple focally involved margins and I elected to excise all margins.  We discussed this. The rationale for surgery was discussed.The procedure has been discussed with the patient. Alternatives to surgery have been discussed with the patient.  Risks of surgery include bleeding,  Infection,  Seroma formation, death,  and the need for further surgery.   The patient understands and wishes to proceed.  Description of procedure: The patient was met in the holding area and questions were answered. The right breast was marked as the correct side. She was taken back to the operating room and placed supine on the OR table. After induction of general anesthesia, the right breast was prepped and draped in a sterile fashion and timeout was done. The old incision was opened. Lumpectomy cavity was entered. The seroma was evacuated. I then excised all margins and these were oriented and passed off the field. They were all sent separately. Cavity was made hemostatic and closed with 3-0 Vicryl for Monocryl. All final counts are found to be correct. Patient was awoke extubated taken to recovery in satisfactory condition.

## 2015-08-01 LAB — HEMOGLOBIN A1C
Hgb A1c MFr Bld: 7.4 % — ABNORMAL HIGH (ref 4.8–5.6)
Mean Plasma Glucose: 166 mg/dL

## 2015-08-03 ENCOUNTER — Encounter (HOSPITAL_COMMUNITY): Payer: Self-pay | Admitting: Surgery

## 2015-08-17 ENCOUNTER — Encounter: Payer: Self-pay | Admitting: Radiation Oncology

## 2015-08-17 ENCOUNTER — Ambulatory Visit
Admission: RE | Admit: 2015-08-17 | Discharge: 2015-08-17 | Disposition: A | Discharge status: Home / Self Care | Payer: No Typology Code available for payment source | Source: Ambulatory Visit | Admitting: Radiation Oncology

## 2015-08-17 ENCOUNTER — Ambulatory Visit
Admission: RE | Admit: 2015-08-17 | Discharge: 2015-08-17 | Disposition: A | Discharge status: Home / Self Care | Payer: No Typology Code available for payment source | Source: Ambulatory Visit | Attending: Radiation Oncology | Admitting: Radiation Oncology

## 2015-08-17 VITALS — BP 134/51 | HR 95 | Temp 98.0°F | Resp 18 | Ht 61.0 in | Wt 193.2 lb

## 2015-08-17 DIAGNOSIS — Z170 Estrogen receptor positive status [ER+]: Secondary | ICD-10-CM | POA: Diagnosis not present

## 2015-08-17 DIAGNOSIS — N189 Chronic kidney disease, unspecified: Secondary | ICD-10-CM | POA: Diagnosis not present

## 2015-08-17 DIAGNOSIS — C50411 Malignant neoplasm of upper-outer quadrant of right female breast: Secondary | ICD-10-CM | POA: Diagnosis present

## 2015-08-17 DIAGNOSIS — I129 Hypertensive chronic kidney disease with stage 1 through stage 4 chronic kidney disease, or unspecified chronic kidney disease: Secondary | ICD-10-CM | POA: Diagnosis not present

## 2015-08-17 DIAGNOSIS — Z853 Personal history of malignant neoplasm of breast: Secondary | ICD-10-CM | POA: Diagnosis not present

## 2015-08-17 DIAGNOSIS — Z510 Encounter for antineoplastic radiation therapy: Secondary | ICD-10-CM | POA: Diagnosis not present

## 2015-08-17 DIAGNOSIS — E785 Hyperlipidemia, unspecified: Secondary | ICD-10-CM | POA: Diagnosis not present

## 2015-08-17 DIAGNOSIS — Z7984 Long term (current) use of oral hypoglycemic drugs: Secondary | ICD-10-CM | POA: Diagnosis not present

## 2015-08-17 DIAGNOSIS — Z809 Family history of malignant neoplasm, unspecified: Secondary | ICD-10-CM | POA: Diagnosis not present

## 2015-08-17 NOTE — Progress Notes (Signed)
Location of Breast Cancer:Right Breast  10 o'clock position upper Outer Quadrant  Histology per Pathology Report: Diagnosis 06/02/15: Breast, right, needle core biopsy, 10:00 o'clock - INVASIVE MAMMARY CARCINOMA.- MAMMARY CARCINOMA IN SITU  Receptor Status: ER(+95%), PR (+80%), Her2-neu (neg), Ki-67 15%  Did patient present with symptoms (if so, please note symptoms) or was this found on screening mammography?:   Past/Anticipated interventions by surgeon, if any: 07/31/2015: Re excision Right breast lumpectomy with Dr. Marcello Moores Cornett,MD Diagnosis 1. Breast, excision, Right reexcision- superior - BENIGN BREAST PARENCHYMA. - THERE IS NO EVIDENCE OF MALIGNANCY. - SEE COMMENT. 2. Breast, excision, Right reexcision- inferior - LOBULAR NEOPLASIA (ATYPICAL LOBULAR HYPERPLASIA). - SEE COMMENT. 3. Breast, excision, Right reexcision- medial - BENIGN BREAST PARENCHYMA. - THERE IS NO EVIDENCE OF MALIGNANCY. - SEE COMMENT. 4. Breast, excision, Right reexcision- lateral - BENIGN BREAST PARENCHYMA. - THERE IS NO EVIDENCE OF MALIGNANCY. - SEE COMMENT. 5. Breast, excision, Right reexcision- posterior - BENIGN BREAST PARENCHYMA. - THERE IS NO EVIDENCE OF MALIGNANCY. - SEE COMMENT. 6. Breast, excision, Right reexcision- anterior - BENIGN BREAST PARENCHYMA. - THERE IS NO EVIDENCE OF MALIGNANCY. - SEE COMMENT. Microscopic Comment 1. - 6. The surgical resection margin(s) of the specimens were inked and microscopically evaluated. Immunohistochemical stains performed on multiple blocks fail to highlight    07/09/15: Dr. Erroll Luna, MD, follow up 07/17/15 J/P drain removed, scheduled for re-excision  Of right lumpectomy, for 2 focal positive  margins on June 10/2015 1.Breast, lumpectomy, Right - INVASIVE LOBULAR CARCINOMA, GRADE 2, SPANNING 1.9 CM. - LOBULAR NEOPLASIA (LOBULAR CARCINOMA IN SITU).- ATYPICAL DUCTAL HYPERPLASIA. - INVASIVE CARCINOMA IS PRESENT AT THE LATERAL MARGIN (FOCAL), LESS THAN  0.1 CM OF THE SUPERIORMARGIN (FOCAL), AND 0.2 CM FROM THE POSTERIOR MARGIN (FOCAL). - SEE ONCOLOGY TABLE. 2. Breast, excision, Right Inferior Margin- FIBROCYSTIC CHANGE AND USUAL DUCTAL HYPERPLASIA.- NO MALIGNANCY. 3. Lymph node, sentinel, biopsy, Right Axillary1 of 4 FINAL for [name], [name] R ([id number]) Diagnosis(continued) - ONE OF ONE LYMPH NODES NEGATIVE FOR MALIGNANCY (0/1). 4. Lymph node, sentinel, biopsy, Right- ONE OF ONE LYMPH NODES NEGATIVE FOR MALIGNANCY (0/1).  Past/Anticipated interventions by medical oncology, if any: Chemotherapy :Dr. Chanetta Marshall  Seen 06/10/15,   Lymphedema issues, if any:  NO  Pain issues, if any: NO, incision  well healed,slight bruising still  SAFETY ISSUES:  Prior radiation? NO  Pacemaker/ICD? NO  Possible current pregnancy?N/A  Is the patient on methotrexate? NO  Current Complaints / other details:  Widowed, G3P3, menarche age  82,  58st live birth age 76, No HRT non smoker, no alcohol, no illicit drugs  Mother Uterine cancer . Throat cancer,  BP 134/51 mmHg  Pulse 95  Temp(Src) 98 F (36.7 C) (Oral)  Resp 18  Ht '5\' 1"'  (1.549 m)  Wt 193 lb 3.2 oz (87.635 kg)  BMI 36.52 kg/m2  Wt Readings from Last 3 Encounters:  08/17/15 193 lb 3.2 oz (87.635 kg)  07/31/15 189 lb (85.73 kg)  07/09/15 196 lb (88.905 kg)      [name] Eaton, RN 08/17/2015,11:09 AM

## 2015-08-17 NOTE — Progress Notes (Addendum)
Radiation Oncology         (336) [phone] ________________________________  Name: [name] [name] MRN: [id number]  Date: 08/17/2015  DOB: [birth year]  FB:PZWCHENID,POEUM W, MD  Nicholas Lose, MD     REFERRING PHYSICIAN: Nicholas Lose, MD   DIAGNOSIS: The encounter diagnosis was Breast cancer of upper-outer quadrant of right female breast ([place]).  Breast cancer of upper-outer quadrant of right female breast [facility])   Staging form: Breast, AJCC 7th Edition     Clinical stage from 06/10/2015: Stage IIA (T2, N0, M0) - Unsigned  HISTORY OF PRESENT ILLNESS::[name] [name] is a 81 y.o. female who is seen for an initial consultation visit regarding the patient's diagnosis of breast cancer.  The patient was found to have suspicious findings within the right breast on initial mammogram. The patient has had symptoms prior to this study: had a palpable mass in April 2017. A diagnostic mammogram and breast ultrasound confirmed this finding. On ultrasound, the tumor measured  1.9 x 1 x 1.4 cm and was present in the UOQ quadrant at the 10 o'clock position.  A biopsy was performed on 06/02/15. This revealed invasive mammary carcinoma with in situ disease.   The patient has not undergone an MRI scan of the breasts..  The patient proceeded to undergo surgical resection of the breast cancer on 06/29/15 with Dr.Cornett. This corresponded to a right breast lumpectomy. Pathology revealed an invasive lobular carcinoma, grade 2 that measured 1.9 cm. Invasive carcinoma was present at the lateral margin, less than 01. Cm of the superior margin and 0.2 cm from the posterior margin. In terms of lymph nodes, 2 nodes were negative for carcinoma. Receptor studies indicated that the tumor is estrogen receptor 95%, progesterone receptor 80%, and HER-2/neu negative. The Ki-67 staining was 15 %.  The patient underwent right breast re-excision on 07/31/15. Medial reexcision was positive for lobular neoplasia, the remaining margins  were benign.  An Oncotype test was not ordered.   PREVIOUS RADIATION THERAPY: No   PAST MEDICAL HISTORY:  has a past medical history of Arthritis; Diabetes mellitus; Hyperlipidemia; Hypertension; Chronic kidney disease; UTI (urinary tract infection); Breast cancer of upper-outer quadrant of right female breast ([place]) (06/04/2015); and PONV (postoperative nausea and vomiting).     PAST SURGICAL HISTORY: Past Surgical History  Procedure Laterality Date  . Cholecystectomy  1989  . Tonsillectomy  1953  . Abdominal hysterectomy  1980  . Cataract extraction    . Radioactive seed guided mastectomy with axillary sentinel lymph node biopsy Right 07/09/2015    Procedure: RADIOACTIVE SEED GUIDED PARTIAL MASTECTOMY WITH AXILLARY SENTINEL LYMPH NODE BIOPSY;  Surgeon: Erroll Luna, MD;  Location: [place];  Service: General;  Laterality: Right;  . Colonoscopy    . Re-excision of breast lumpectomy Right 07/31/2015    Procedure: RE-EXCISION OF RIGHT BREAST LUMPECTOMY;  Surgeon: Erroll Luna, MD;  Location: [place];  Service: General;  Laterality: Right;    FAMILY HISTORY: family history includes Cancer in her mother; Diabetes in her brother and sister; Heart disease (age of onset: 94) in her father.  - Mother with uterine cancer   SOCIAL HISTORY:  reports that she has never smoked. She does not have any smokeless tobacco history on file. She reports that she does not drink alcohol or use illicit drugs.   ALLERGIES: Percocet; Acetaminophen; Actos; Latex; Lisinopril; Shrimp; Zocor; and Penicillins   MEDICATIONS:  Current Outpatient Prescriptions  Medication Sig Dispense Refill  . aspirin EC 81 MG tablet Take  81 mg by mouth daily.    Marland Kitchen atorvastatin (LIPITOR) 20 MG tablet Take 1 tablet (20 mg total) by mouth daily. 90 tablet 3  . glimepiride (AMARYL) 4 MG tablet Take 1 tablet (4 mg total) by mouth every morning. 90 tablet 3  . Glucosamine-Chondroitin (OSTEO BI-FLEX REGULAR STRENGTH  PO) Take 1 tablet by mouth daily as needed (For joint health.). Reported on 08/17/2015    . latanoprost (XALATAN) 0.005 % ophthalmic solution Place 1 drop into both eyes at bedtime.  4  . losartan-hydrochlorothiazide (HYZAAR) 50-12.5 MG tablet Take 1 tablet by mouth daily. 90 tablet 3  . metFORMIN (GLUCOPHAGE) 500 MG tablet TAKE 2 TABLETS BY MOUTH EVERY MORNING AND 1 TABLET EVERY NIGHT AT BEDTIME 90 tablet 1  . ONE TOUCH ULTRA TEST test strip USE TO CHECK BLOOD SUGAR TWICE DAILY 100 each 2  . Tavaborole (KERYDIN) 5 % SOLN Apply 1 application topically at bedtime. Apply to toenails for fungal infection.    . ondansetron (ZOFRAN ODT) 4 MG disintegrating tablet Take 1 tablet (4 mg total) by mouth every 8 (eight) hours as needed for nausea or vomiting. (Patient not taking: Reported on 08/17/2015) 20 tablet 0  . traMADol (ULTRAM) 50 MG tablet Take 1 tablet (50 mg total) by mouth every 6 (six) hours as needed for moderate pain. (Patient not taking: Reported on 08/17/2015) 30 tablet 0   No current facility-administered medications for this encounter.     REVIEW OF SYSTEMS:   On review of systems, the patient reports that she is doing well overall. She denies any chest pain, shortness of breath, cough, fevers, chills, night sweats, unintended weight changes. She denies any bowel or bladder disturbances, and denies abdominal pain, nausea or vomiting. She denies any new musculoskeletal or joint aches or pains. A complete review of systems is obtained and is otherwise negative.     PHYSICAL EXAM:  height is '5\' 1"'  (1.549 m) and weight is 193 lb 3.2 oz (87.635 kg). Her oral temperature is 98 F (36.7 C). Her blood pressure is 134/51 and her pulse is 95. Her respiration is 18.   In general this is a well appearing African female in no acute distress. She's alert and oriented x4 and appropriate throughout the examination. Cardiopulmonary assessment is negative for acute distress and exhibits normal effort.      ECOG = 0   0 - Asymptomatic (Fully active, able to carry on all predisease activities without restriction)  1 - Symptomatic but completely ambulatory (Restricted in physically strenuous activity but ambulatory and able to carry out work of a light or sedentary nature. For example, light housework, office work)  2 - Symptomatic, <50% in bed during the day (Ambulatory and capable of all self care but unable to carry out any work activities. Up and about more than 50% of waking hours)  3 - Symptomatic, >50% in bed, but not bedbound (Capable of only limited self-care, confined to bed or chair 50% or more of waking hours)  4 - Bedbound (Completely disabled. Cannot carry on any self-care. Totally confined to bed or chair)  5 - Death   Eustace Pen MM, Creech RH, Tormey DC, et al. [phone]). "Toxicity and response criteria of the [facility]". [place] Oncol. 5 (6): 649-55   LABORATORY DATA:  Lab Results  Component Value Date   WBC 5.0 07/31/2015   HGB 11.5* 07/31/2015   HCT 35.6* 07/31/2015   MCV 93.9 07/31/2015   PLT 178 07/31/2015  Lab Results  Component Value Date   NA 140 07/31/2015   K 3.4* 07/31/2015   CL 107 07/31/2015   CO2 26 07/31/2015   Lab Results  Component Value Date   ALT 12 06/10/2015   AST 11 06/10/2015   ALKPHOS 83 06/10/2015   BILITOT 0.91 06/10/2015      RADIOGRAPHY: No results found.     IMPRESSION:    Breast cancer of upper-outer quadrant of right female breast ([place])   06/02/2015 Initial Diagnosis Right breast biopsy 10:00 position: Invasive lobular cancer with LCIS, ER 95%, PR 80%, Ki-67 15%, HER-2 negative duration 1.61, ultrasound revealed a 1.9 x 1 x 1.4 cm lesion, T1 cN0 stage IA clinical stage   07/09/2015 Surgery Right lumpectomy: Invasive lobular cancer, grade 2, 1.9 cm, LCIS, ADH, lateral margin focally positive, 0/2 lymph nodes negative, ER 95%, PR 80%, Ki-67 15%, HER-2 negative, T1cN0 stage IA    The patient has a  recent diagnosis of invasive lobular carcinoma of the right breast. She has completed surgery and appears to be doing satisfactorily postoperatively.   PLAN:   I discussed with the patient the role of adjuvant radiation treatment in this setting. We discussed the potential benefit of radiation treatment, especially with regards to local control of the patient's tumor. We also discussed the possible side effects and risks of such a treatment as well.  All of the patient's questions were answered. The patient wishes to proceed with radiation treatment at the appropriate time. The patient has signed informed consent.  The patient will proceed with simulation, and this will be completed on June 30 th.  I anticipate treating the patient with a course of adjuvant radiation treatment to the right breast for 4 weeks.      ________________________________   Jodelle Gross, MD, PhD   **Disclaimer: This note was dictated with voice recognition software. Similar sounding words can inadvertently be transcribed and this note may contain transcription errors which may not have been corrected upon publication of note.**  This document serves as a record of services personally performed by Kyung Rudd, MD. It was created on his behalf by Derek Mound, a trained medical scribe. The creation of this record is based on the scribe's personal observations and the provider's statements to them. This document has been checked and approved by the attending provider.

## 2015-08-17 NOTE — Progress Notes (Signed)
Please see the Nurse Progress Note in the MD Initial Consult Encounter for this patient. 

## 2015-08-21 ENCOUNTER — Ambulatory Visit
Admission: RE | Admit: 2015-08-21 | Discharge: 2015-08-21 | Disposition: A | Discharge status: Home / Self Care | Payer: No Typology Code available for payment source | Source: Ambulatory Visit | Attending: Radiation Oncology | Admitting: Radiation Oncology

## 2015-08-21 DIAGNOSIS — Z510 Encounter for antineoplastic radiation therapy: Secondary | ICD-10-CM | POA: Diagnosis not present

## 2015-08-21 DIAGNOSIS — C50411 Malignant neoplasm of upper-outer quadrant of right female breast: Secondary | ICD-10-CM

## 2015-08-24 ENCOUNTER — Telehealth: Payer: Self-pay | Admitting: Hematology and Oncology

## 2015-08-24 ENCOUNTER — Other Ambulatory Visit: Payer: Self-pay | Admitting: *Deleted

## 2015-08-24 DIAGNOSIS — Z510 Encounter for antineoplastic radiation therapy: Secondary | ICD-10-CM | POA: Diagnosis not present

## 2015-08-24 NOTE — Progress Notes (Signed)
  Radiation Oncology         [phone] ________________________________  Name: [name] [name] MRN: [id number]  Date: 08/21/2015  DOB: December 02, 1934  Optical Surface Tracking Plan:  Since intensity modulated radiotherapy (IMRT) and 3D conformal radiation treatment methods are predicated on accurate and precise positioning for treatment, intrafraction motion monitoring is medically necessary to ensure accurate and safe treatment delivery.  The ability to quantify intrafraction motion without excessive ionizing radiation dose can only be performed with optical surface tracking. Accordingly, surface imaging offers the opportunity to obtain 3D measurements of patient position throughout IMRT and 3D treatments without excessive radiation exposure.  I am ordering optical surface tracking for this patient's upcoming course of radiotherapy. ________________________________  Kyung Rudd, MD 08/24/2015 8:13 AM    Reference:   Particia Jasper, et al. Surface imaging-based analysis of intrafraction motion for breast radiotherapy patients.Journal of [place], n. 6, nov. 2014. ISSN [id number].   Available at: <http://www.jacmp.org/index.php/jacmp/article/view/4957>.

## 2015-08-24 NOTE — Progress Notes (Signed)
  Radiation Oncology         [phone] ________________________________  Name: [name] [name] MRN: [id number]  Date: 08/21/2015  DOB: 1934/03/23   DIAGNOSIS:     ICD-9-CM ICD-10-CM   1. Breast cancer of upper-outer quadrant of right female breast ([place]) 174.4 C50.411     SIMULATION AND TREATMENT PLANNING NOTE  The patient presented for simulation prior to beginning her course of radiation treatment for her diagnosis of Right-sided breast cancer. The patient was placed in a supine position on a breast board. A customized vac-lock bag was constructed and this complex treatment device will be used on a daily basis during her treatment. In this fashion, a CT scan was obtained through the chest area and an isocenter was placed near the chest wall within the breast.  The patient will be planned to receive a course of radiation initially to a dose of 42.5 Gy. This will consist of a whole breast radiotherapy technique. To accomplish this, 2 customized blocks have been designed which will correspond to medial and lateral whole breast tangent fields. This treatment will be accomplished at 2.5 Gy per fraction. A forward planning technique will also be evaluated to determine if this approach improves the plan. It is anticipated that the patient will then receive a 7.5 Gy boost to the seroma cavity which has been contoured. This will be accomplished at 2.5 Gy per fraction.   This initial treatment will consist of a 3-D conformal technique. The seroma has been contoured as the primary target structure. Additionally, dose volume histograms of both this target as well as the lungs and heart will also be evaluated. Such an approach is necessary to ensure that the target area is adequately covered while the nearby critical  normal structures are adequately spared.  Plan:  The final anticipated total dose therefore will correspond to 50 Gy.    _______________________________   Jodelle Gross, MD, PhD

## 2015-08-24 NOTE — Addendum Note (Signed)
Encounter addended by: Kyung Rudd, MD on: 08/24/2015  8:03 AM<BR>     Documentation filed: Notes Section

## 2015-08-24 NOTE — Telephone Encounter (Signed)
s.w. pt and advised on Aug appt....pt ok and aware °

## 2015-08-26 DIAGNOSIS — Z510 Encounter for antineoplastic radiation therapy: Secondary | ICD-10-CM | POA: Diagnosis not present

## 2015-08-31 ENCOUNTER — Ambulatory Visit
Admission: RE | Admit: 2015-08-31 | Discharge: 2015-08-31 | Disposition: A | Discharge status: Home / Self Care | Payer: No Typology Code available for payment source | Source: Ambulatory Visit | Attending: Radiation Oncology | Admitting: Radiation Oncology

## 2015-08-31 DIAGNOSIS — Z510 Encounter for antineoplastic radiation therapy: Secondary | ICD-10-CM | POA: Diagnosis not present

## 2015-09-01 ENCOUNTER — Ambulatory Visit
Admission: RE | Admit: 2015-09-01 | Discharge: 2015-09-01 | Disposition: A | Discharge status: Home / Self Care | Payer: No Typology Code available for payment source | Source: Ambulatory Visit | Attending: Radiation Oncology | Admitting: Radiation Oncology

## 2015-09-01 DIAGNOSIS — Z510 Encounter for antineoplastic radiation therapy: Secondary | ICD-10-CM | POA: Diagnosis not present

## 2015-09-01 DIAGNOSIS — C50411 Malignant neoplasm of upper-outer quadrant of right female breast: Secondary | ICD-10-CM

## 2015-09-01 MED ORDER — ALRA NON-METALLIC DEODORANT (RAD-ONC)
1.0000 "application " | Freq: Once | TOPICAL | Status: AC
  Administered 2015-09-01: 1 via TOPICAL

## 2015-09-01 MED ORDER — RADIAPLEXRX EX GEL
Freq: Once | CUTANEOUS | Status: AC
  Administered 2015-09-01: 11:00:00 via TOPICAL

## 2015-09-01 NOTE — Progress Notes (Signed)
Pt education done radiation therapy and you book, alra,radiaplex gel, my busines crad, discussed ways to manage side effects, skin irritation, fatigue, swelling brerast, use of electric shaver, increase protein in diet, stay hydrated, drink plenty water, verbal understanding, teach back given 10:39 AM

## 2015-09-02 ENCOUNTER — Ambulatory Visit
Admission: RE | Admit: 2015-09-02 | Discharge: 2015-09-02 | Disposition: A | Discharge status: Home / Self Care | Payer: No Typology Code available for payment source | Source: Ambulatory Visit | Attending: Radiation Oncology | Admitting: Radiation Oncology

## 2015-09-02 DIAGNOSIS — Z510 Encounter for antineoplastic radiation therapy: Secondary | ICD-10-CM | POA: Diagnosis not present

## 2015-09-03 ENCOUNTER — Ambulatory Visit
Admission: RE | Admit: 2015-09-03 | Discharge: 2015-09-03 | Disposition: A | Discharge status: Home / Self Care | Payer: No Typology Code available for payment source | Source: Ambulatory Visit | Attending: Radiation Oncology | Admitting: Radiation Oncology

## 2015-09-03 DIAGNOSIS — Z510 Encounter for antineoplastic radiation therapy: Secondary | ICD-10-CM | POA: Diagnosis not present

## 2015-09-04 ENCOUNTER — Ambulatory Visit
Admission: RE | Admit: 2015-09-04 | Discharge: 2015-09-04 | Disposition: A | Discharge status: Home / Self Care | Payer: No Typology Code available for payment source | Source: Ambulatory Visit | Attending: Radiation Oncology | Admitting: Radiation Oncology

## 2015-09-04 ENCOUNTER — Encounter: Payer: Self-pay | Admitting: Radiation Oncology

## 2015-09-04 VITALS — BP 120/54 | HR 70 | Temp 97.7°F | Resp 16 | Wt 189.7 lb

## 2015-09-04 DIAGNOSIS — Z510 Encounter for antineoplastic radiation therapy: Secondary | ICD-10-CM | POA: Diagnosis not present

## 2015-09-04 DIAGNOSIS — C50411 Malignant neoplasm of upper-outer quadrant of right female breast: Secondary | ICD-10-CM

## 2015-09-04 NOTE — Progress Notes (Signed)
Weekly rad txs right breast 4/20 completed no skin changes, still slight bruising at incision on breast, appetite good, using radaiplex bid BP 120/54 mmHg  Pulse 70  Temp(Src) 97.7 F (36.5 C) (Oral)  Resp 16  Wt 189 lb 11.2 oz (86.047 kg)

## 2015-09-05 NOTE — Progress Notes (Signed)
   Department of Radiation Oncology  Phone:  [phone] Fax:        [phone]  Weekly Treatment Note    Name: [name] [name] Date: 09/05/2015 MRN: [id number] DOB: 09-11-1934   Diagnosis:     ICD-9-CM ICD-10-CM   1. Breast cancer of upper-outer quadrant of right female breast ([place]) 174.4 C50.411      Current dose: 10 Gy  Current fraction: 4   MEDICATIONS: Current Outpatient Prescriptions  Medication Sig Dispense Refill  . aspirin EC 81 MG tablet Take 81 mg by mouth daily.    Marland Kitchen atorvastatin (LIPITOR) 20 MG tablet Take 1 tablet (20 mg total) by mouth daily. 90 tablet 3  . glimepiride (AMARYL) 4 MG tablet Take 1 tablet (4 mg total) by mouth every morning. 90 tablet 3  . Glucosamine-Chondroitin (OSTEO BI-FLEX REGULAR STRENGTH PO) Take 1 tablet by mouth daily as needed (For joint health.). Reported on 08/17/2015    . latanoprost (XALATAN) 0.005 % ophthalmic solution Place 1 drop into both eyes at bedtime.  4  . losartan-hydrochlorothiazide (HYZAAR) 50-12.5 MG tablet Take 1 tablet by mouth daily. 90 tablet 3  . metFORMIN (GLUCOPHAGE) 500 MG tablet TAKE 2 TABLETS BY MOUTH EVERY MORNING AND 1 TABLET EVERY NIGHT AT BEDTIME 90 tablet 1  . ONE TOUCH ULTRA TEST test strip USE TO CHECK BLOOD SUGAR TWICE DAILY 100 each 2  . Tavaborole (KERYDIN) 5 % SOLN Apply 1 application topically at bedtime. Apply to toenails for fungal infection.    . ondansetron (ZOFRAN ODT) 4 MG disintegrating tablet Take 1 tablet (4 mg total) by mouth every 8 (eight) hours as needed for nausea or vomiting. (Patient not taking: Reported on 08/17/2015) 20 tablet 0  . traMADol (ULTRAM) 50 MG tablet Take 1 tablet (50 mg total) by mouth every 6 (six) hours as needed for moderate pain. (Patient not taking: Reported on 08/17/2015) 30 tablet 0   No current facility-administered medications for this encounter.     ALLERGIES: Percocet; Acetaminophen; Actos; Latex; Lisinopril; Shrimp; Zocor; and Penicillins   LABORATORY  DATA:  Lab Results  Component Value Date   WBC 5.0 07/31/2015   HGB 11.5* 07/31/2015   HCT 35.6* 07/31/2015   MCV 93.9 07/31/2015   PLT 178 07/31/2015   Lab Results  Component Value Date   NA 140 07/31/2015   K 3.4* 07/31/2015   CL 107 07/31/2015   CO2 26 07/31/2015   Lab Results  Component Value Date   ALT 12 06/10/2015   AST 11 06/10/2015   ALKPHOS 83 06/10/2015   BILITOT 0.91 06/10/2015     NARRATIVE: [name] was seen today for weekly treatment management. The chart was checked and the patient's films were reviewed.  Weekly rad txs right breast 4/20 completed no skin changes, still slight bruising at incision on breast, appetite good, using radaiplex bid BP 120/54 mmHg  Pulse 70  Temp(Src) 97.7 F (36.5 C) (Oral)  Resp 16  Wt 189 lb 11.2 oz (86.047 kg)  PHYSICAL EXAMINATION: weight is 189 lb 11.2 oz (86.047 kg). Her oral temperature is 97.7 F (36.5 C). Her blood pressure is 120/54 and her pulse is 70. Her respiration is 16.        ASSESSMENT: The patient is doing satisfactorily with treatment.  PLAN: We will continue with the patient's radiation treatment as planned.

## 2015-09-07 ENCOUNTER — Ambulatory Visit
Admission: RE | Admit: 2015-09-07 | Discharge: 2015-09-07 | Disposition: A | Discharge status: Home / Self Care | Payer: No Typology Code available for payment source | Source: Ambulatory Visit | Attending: Radiation Oncology | Admitting: Radiation Oncology

## 2015-09-07 DIAGNOSIS — Z510 Encounter for antineoplastic radiation therapy: Secondary | ICD-10-CM | POA: Diagnosis not present

## 2015-09-08 ENCOUNTER — Telehealth: Payer: Self-pay | Admitting: *Deleted

## 2015-09-08 ENCOUNTER — Ambulatory Visit
Admission: RE | Admit: 2015-09-08 | Discharge: 2015-09-08 | Disposition: A | Discharge status: Home / Self Care | Payer: No Typology Code available for payment source | Source: Ambulatory Visit | Attending: Radiation Oncology | Admitting: Radiation Oncology

## 2015-09-08 DIAGNOSIS — Z510 Encounter for antineoplastic radiation therapy: Secondary | ICD-10-CM | POA: Diagnosis not present

## 2015-09-08 NOTE — Telephone Encounter (Signed)
  Oncology Nurse Navigator Documentation  Navigator Location: [facility] (09/08/15 1400) Navigator Encounter Type: Telephone (09/08/15 1400) Telephone: [place] Call (09/08/15 1400)         Patient Visit Type: [id number] (09/08/15 1400) Treatment Phase: First Radiation Tx (09/08/15 1400)                            Time Spent with Patient: 15 (09/08/15 1400)

## 2015-09-09 ENCOUNTER — Ambulatory Visit
Admission: RE | Admit: 2015-09-09 | Discharge: 2015-09-09 | Disposition: A | Discharge status: Home / Self Care | Payer: No Typology Code available for payment source | Source: Ambulatory Visit | Attending: Radiation Oncology | Admitting: Radiation Oncology

## 2015-09-09 DIAGNOSIS — Z510 Encounter for antineoplastic radiation therapy: Secondary | ICD-10-CM | POA: Diagnosis not present

## 2015-09-10 ENCOUNTER — Ambulatory Visit
Admission: RE | Admit: 2015-09-10 | Discharge: 2015-09-10 | Disposition: A | Discharge status: Home / Self Care | Payer: No Typology Code available for payment source | Source: Ambulatory Visit | Attending: Radiation Oncology | Admitting: Radiation Oncology

## 2015-09-10 DIAGNOSIS — Z510 Encounter for antineoplastic radiation therapy: Secondary | ICD-10-CM | POA: Diagnosis not present

## 2015-09-11 ENCOUNTER — Encounter: Payer: Self-pay | Admitting: Radiation Oncology

## 2015-09-11 ENCOUNTER — Ambulatory Visit
Admission: RE | Admit: 2015-09-11 | Discharge: 2015-09-11 | Disposition: A | Discharge status: Home / Self Care | Payer: No Typology Code available for payment source | Source: Ambulatory Visit | Attending: Radiation Oncology | Admitting: Radiation Oncology

## 2015-09-11 VITALS — BP 135/54 | HR 75 | Temp 97.8°F | Resp 16 | Wt 193.8 lb

## 2015-09-11 DIAGNOSIS — C50411 Malignant neoplasm of upper-outer quadrant of right female breast: Secondary | ICD-10-CM

## 2015-09-11 DIAGNOSIS — Z510 Encounter for antineoplastic radiation therapy: Secondary | ICD-10-CM | POA: Diagnosis not present

## 2015-09-11 NOTE — Progress Notes (Signed)
Weekly rad txs right breast 9/17 completed  Mild tanning  Skin intact, occasional shooting pain in breast, using radaiplex bid,  10:28 AM BP 135/54 mmHg  Pulse 75  Temp(Src) 97.8 F (36.6 C) (Oral)  Resp 16  Wt 193 lb 12.8 oz (87.907 kg)  Wt Readings from Last 3 Encounters:  09/11/15 193 lb 12.8 oz (87.907 kg)  09/04/15 189 lb 11.2 oz (86.047 kg)  08/17/15 193 lb 3.2 oz (87.635 kg)

## 2015-09-12 NOTE — Progress Notes (Signed)
Department of Radiation Oncology  Phone:  [phone] Fax:        [phone]  Weekly Treatment Note    Name: [name] [name] Date: 09/12/2015 MRN: [id number] DOB: February 12, 1935   Diagnosis:     ICD-9-CM ICD-10-CM   1. Breast cancer of upper-outer quadrant of right female breast ([place]) 174.4 C50.411      Current dose: 17.5 Gy  Current fraction: 9   MEDICATIONS: Current Outpatient Prescriptions  Medication Sig Dispense Refill  . atorvastatin (LIPITOR) 20 MG tablet Take 1 tablet (20 mg total) by mouth daily. 90 tablet 3  . glimepiride (AMARYL) 4 MG tablet Take 1 tablet (4 mg total) by mouth every morning. 90 tablet 3  . Glucosamine-Chondroitin (OSTEO BI-FLEX REGULAR STRENGTH PO) Take 1 tablet by mouth daily as needed (For joint health.). Reported on 08/17/2015    . latanoprost (XALATAN) 0.005 % ophthalmic solution Place 1 drop into both eyes at bedtime.  4  . losartan-hydrochlorothiazide (HYZAAR) 50-12.5 MG tablet Take 1 tablet by mouth daily. 90 tablet 3  . metFORMIN (GLUCOPHAGE) 500 MG tablet TAKE 2 TABLETS BY MOUTH EVERY MORNING AND 1 TABLET EVERY NIGHT AT BEDTIME 90 tablet 1  . ONE TOUCH ULTRA TEST test strip USE TO CHECK BLOOD SUGAR TWICE DAILY 100 each 2  . Tavaborole (KERYDIN) 5 % SOLN Apply 1 application topically at bedtime. Apply to toenails for fungal infection.    Marland Kitchen aspirin EC 81 MG tablet Take 81 mg by mouth daily.    . ondansetron (ZOFRAN ODT) 4 MG disintegrating tablet Take 1 tablet (4 mg total) by mouth every 8 (eight) hours as needed for nausea or vomiting. (Patient not taking: Reported on 08/17/2015) 20 tablet 0  . traMADol (ULTRAM) 50 MG tablet Take 1 tablet (50 mg total) by mouth every 6 (six) hours as needed for moderate pain. (Patient not taking: Reported on 08/17/2015) 30 tablet 0   No current facility-administered medications for this encounter.     ALLERGIES: Percocet; Acetaminophen; Actos; Latex; Lisinopril; Shrimp; Zocor; and  Penicillins   LABORATORY DATA:  Lab Results  Component Value Date   WBC 5.0 07/31/2015   HGB 11.5* 07/31/2015   HCT 35.6* 07/31/2015   MCV 93.9 07/31/2015   PLT 178 07/31/2015   Lab Results  Component Value Date   NA 140 07/31/2015   K 3.4* 07/31/2015   CL 107 07/31/2015   CO2 26 07/31/2015   Lab Results  Component Value Date   ALT 12 06/10/2015   AST 11 06/10/2015   ALKPHOS 83 06/10/2015   BILITOT 0.91 06/10/2015     NARRATIVE: [name] [name] was seen today for weekly treatment management. The chart was checked and the patient's films were reviewed.  Weekly rad txs right breast 9/17 completed  Mild tanning  Skin intact, occasional shooting pain in breast, using radaiplex bid,  12:10 PM BP 135/54 mmHg  Pulse 75  Temp(Src) 97.8 F (36.6 C) (Oral)  Resp 16  Wt 193 lb 12.8 oz (87.907 kg)  Wt Readings from Last 3 Encounters:  09/11/15 193 lb 12.8 oz (87.907 kg)  09/04/15 189 lb 11.2 oz (86.047 kg)  08/17/15 193 lb 3.2 oz (87.635 kg)    PHYSICAL EXAMINATION: weight is 193 lb 12.8 oz (87.907 kg). Her oral temperature is 97.8 F (36.6 C). Her blood pressure is 135/54 and her pulse is 75. Her respiration is 16.      No desquamation present  ASSESSMENT: The patient is doing satisfactorily with treatment.  PLAN: We will continue with the patient's radiation treatment as planned.

## 2015-09-14 ENCOUNTER — Ambulatory Visit
Admission: RE | Admit: 2015-09-14 | Discharge: 2015-09-14 | Disposition: A | Discharge status: Home / Self Care | Payer: No Typology Code available for payment source | Source: Ambulatory Visit | Attending: Radiation Oncology | Admitting: Radiation Oncology

## 2015-09-14 DIAGNOSIS — Z510 Encounter for antineoplastic radiation therapy: Secondary | ICD-10-CM | POA: Diagnosis not present

## 2015-09-15 ENCOUNTER — Ambulatory Visit
Admission: RE | Admit: 2015-09-15 | Discharge: 2015-09-15 | Disposition: A | Discharge status: Home / Self Care | Payer: No Typology Code available for payment source | Source: Ambulatory Visit | Attending: Radiation Oncology | Admitting: Radiation Oncology

## 2015-09-15 DIAGNOSIS — Z510 Encounter for antineoplastic radiation therapy: Secondary | ICD-10-CM | POA: Diagnosis not present

## 2015-09-16 ENCOUNTER — Ambulatory Visit
Admission: RE | Admit: 2015-09-16 | Discharge: 2015-09-16 | Disposition: A | Discharge status: Home / Self Care | Payer: No Typology Code available for payment source | Source: Ambulatory Visit | Attending: Radiation Oncology | Admitting: Radiation Oncology

## 2015-09-16 DIAGNOSIS — Z510 Encounter for antineoplastic radiation therapy: Secondary | ICD-10-CM | POA: Diagnosis not present

## 2015-09-17 ENCOUNTER — Ambulatory Visit
Admission: RE | Admit: 2015-09-17 | Discharge: 2015-09-17 | Disposition: A | Discharge status: Home / Self Care | Payer: No Typology Code available for payment source | Source: Ambulatory Visit | Attending: Radiation Oncology | Admitting: Radiation Oncology

## 2015-09-17 DIAGNOSIS — Z510 Encounter for antineoplastic radiation therapy: Secondary | ICD-10-CM | POA: Diagnosis not present

## 2015-09-18 ENCOUNTER — Ambulatory Visit
Admission: RE | Admit: 2015-09-18 | Discharge: 2015-09-18 | Disposition: A | Discharge status: Home / Self Care | Payer: No Typology Code available for payment source | Source: Ambulatory Visit | Attending: Radiation Oncology | Admitting: Radiation Oncology

## 2015-09-18 ENCOUNTER — Ambulatory Visit: Payer: No Typology Code available for payment source | Admitting: Radiation Oncology

## 2015-09-18 VITALS — BP 129/48 | HR 84 | Temp 98.2°F | Ht 61.0 in | Wt 192.6 lb

## 2015-09-18 DIAGNOSIS — Z510 Encounter for antineoplastic radiation therapy: Secondary | ICD-10-CM | POA: Diagnosis not present

## 2015-09-18 DIAGNOSIS — C50411 Malignant neoplasm of upper-outer quadrant of right female breast: Secondary | ICD-10-CM

## 2015-09-18 NOTE — Progress Notes (Signed)
[name] [name] has completed 14 fractions to her right breast.  She denies having pain other than occasional sharp pains in her right breast.  She is using radiaplex bid and has been provided with a refill. The skin on her right breast has hyperpigmentation.  BP (!) 129/48 (BP Location: Left Arm, Patient Position: Sitting)   Pulse 84   Temp 98.2 F (36.8 C) (Oral)   Ht 5\' 1"  (1.549 m)   Wt 192 lb 9.6 oz (87.4 kg)   SpO2 99%   BMI 36.39 kg/m    Wt Readings from Last 3 Encounters:  09/18/15 192 lb 9.6 oz (87.4 kg)  09/11/15 193 lb 12.8 oz (87.9 kg)  09/04/15 189 lb 11.2 oz (86 kg)

## 2015-09-18 NOTE — Progress Notes (Signed)
Department of Radiation Oncology  Phone:  [phone] Fax:        [phone]  Weekly Treatment Note    Name: [name] [name] Date: 09/18/2015 MRN: [id number] DOB: December 06, 1934   Diagnosis:     ICD-9-CM ICD-10-CM   1. Breast cancer of upper-outer quadrant of right female breast ([place]) 174.4 C50.411      Current dose: 36 Gy  Current fraction: 14   MEDICATIONS: Current Outpatient Prescriptions  Medication Sig Dispense Refill  . aspirin EC 81 MG tablet Take 81 mg by mouth daily.    Marland Kitchen atorvastatin (LIPITOR) 20 MG tablet Take 1 tablet (20 mg total) by mouth daily. 90 tablet 3  . glimepiride (AMARYL) 4 MG tablet Take 1 tablet (4 mg total) by mouth every morning. 90 tablet 3  . Glucosamine-Chondroitin (OSTEO BI-FLEX REGULAR STRENGTH PO) Take 1 tablet by mouth daily as needed (For joint health.). Reported on 08/17/2015    . latanoprost (XALATAN) 0.005 % ophthalmic solution Place 1 drop into both eyes at bedtime.  4  . losartan-hydrochlorothiazide (HYZAAR) 50-12.5 MG tablet Take 1 tablet by mouth daily. 90 tablet 3  . metFORMIN (GLUCOPHAGE) 500 MG tablet TAKE 2 TABLETS BY MOUTH EVERY MORNING AND 1 TABLET EVERY NIGHT AT BEDTIME 90 tablet 1  . ONE TOUCH ULTRA TEST test strip USE TO CHECK BLOOD SUGAR TWICE DAILY 100 each 2  . Tavaborole (KERYDIN) 5 % SOLN Apply 1 application topically at bedtime. Apply to toenails for fungal infection.    . ondansetron (ZOFRAN ODT) 4 MG disintegrating tablet Take 1 tablet (4 mg total) by mouth every 8 (eight) hours as needed for nausea or vomiting. (Patient not taking: Reported on 08/17/2015) 20 tablet 0  . traMADol (ULTRAM) 50 MG tablet Take 1 tablet (50 mg total) by mouth every 6 (six) hours as needed for moderate pain. (Patient not taking: Reported on 08/17/2015) 30 tablet 0   No current facility-administered medications for this encounter.      ALLERGIES: Percocet [oxycodone-acetaminophen]; Acetaminophen; Actos [pioglitazone hydrochloride]; Latex;  Lisinopril; Shrimp [shellfish allergy]; Zocor [simvastatin - high dose]; and Penicillins   LABORATORY DATA:  Lab Results  Component Value Date   WBC 5.0 07/31/2015   HGB 11.5 (L) 07/31/2015   HCT 35.6 (L) 07/31/2015   MCV 93.9 07/31/2015   PLT 178 07/31/2015   Lab Results  Component Value Date   NA 140 07/31/2015   K 3.4 (L) 07/31/2015   CL 107 07/31/2015   CO2 26 07/31/2015   Lab Results  Component Value Date   ALT 12 06/10/2015   AST 11 06/10/2015   ALKPHOS 83 06/10/2015   BILITOT 0.91 06/10/2015     NARRATIVE: [name] [name] was seen today for weekly treatment management. The chart was checked and the patient's films were reviewed. [name] [name] has completed 14 fractions to her right breast.  She denies having pain other than occasional sharp pains in her right breast.  She is using radiaplex bid and has been provided with a refill. The skin on her right breast has hyperpigmentation.    11:39 AM BP (!) 129/48 (BP Location: Left Arm, Patient Position: Sitting)   Pulse 84   Temp 98.2 F (36.8 C) (Oral)   Ht 5\' 1"  (1.549 m)   Wt 192 lb 9.6 oz (87.4 kg)   SpO2 99%   BMI 36.39 kg/m   Wt Readings from Last 3 Encounters:  09/18/15 192 lb 9.6 oz (87.4 kg)  09/11/15 193 lb 12.8 oz (  87.9 kg)  09/04/15 189 lb 11.2 oz (86 kg)    PHYSICAL EXAMINATION: height is 5\' 1"  (1.549 m) and weight is 192 lb 9.6 oz (87.4 kg). Her oral temperature is 98.2 F (36.8 C). Her blood pressure is 129/48 (abnormal) and her pulse is 84. Her oxygen saturation is 99%.     There is some mild erythema on the breast but no desquamation.   ASSESSMENT: The patient is doing satisfactorily with treatment.  PLAN: We will continue with the patient's radiation treatment as planned.        This document serves as a record of services personally performed by Tyler Pita, MD. It was created on his behalf by Truddie Hidden, a trained medical scribe. The creation of this record is based on the  scribe's personal observations and the provider's statements to them. This document has been checked and approved by the attending provider.

## 2015-09-21 ENCOUNTER — Ambulatory Visit: Payer: No Typology Code available for payment source | Admitting: Radiation Oncology

## 2015-09-21 ENCOUNTER — Encounter: Payer: Self-pay | Admitting: Radiation Oncology

## 2015-09-21 ENCOUNTER — Ambulatory Visit
Admission: RE | Admit: 2015-09-21 | Discharge: 2015-09-21 | Disposition: A | Discharge status: Home / Self Care | Payer: No Typology Code available for payment source | Source: Ambulatory Visit | Attending: Radiation Oncology | Admitting: Radiation Oncology

## 2015-09-21 DIAGNOSIS — Z510 Encounter for antineoplastic radiation therapy: Secondary | ICD-10-CM | POA: Diagnosis not present

## 2015-09-22 ENCOUNTER — Ambulatory Visit
Admission: RE | Admit: 2015-09-22 | Discharge: 2015-09-22 | Disposition: A | Discharge status: Home / Self Care | Payer: No Typology Code available for payment source | Source: Ambulatory Visit | Attending: Radiation Oncology | Admitting: Radiation Oncology

## 2015-09-22 DIAGNOSIS — Z510 Encounter for antineoplastic radiation therapy: Secondary | ICD-10-CM | POA: Diagnosis not present

## 2015-09-23 ENCOUNTER — Ambulatory Visit
Admission: RE | Admit: 2015-09-23 | Discharge: 2015-09-23 | Disposition: A | Discharge status: Home / Self Care | Payer: No Typology Code available for payment source | Source: Ambulatory Visit | Attending: Radiation Oncology | Admitting: Radiation Oncology

## 2015-09-23 DIAGNOSIS — Z510 Encounter for antineoplastic radiation therapy: Secondary | ICD-10-CM | POA: Diagnosis not present

## 2015-09-24 ENCOUNTER — Ambulatory Visit
Admission: RE | Admit: 2015-09-24 | Discharge: 2015-09-24 | Disposition: A | Discharge status: Home / Self Care | Payer: No Typology Code available for payment source | Source: Ambulatory Visit | Attending: Radiation Oncology | Admitting: Radiation Oncology

## 2015-09-24 DIAGNOSIS — Z510 Encounter for antineoplastic radiation therapy: Secondary | ICD-10-CM | POA: Diagnosis not present

## 2015-09-25 ENCOUNTER — Encounter: Payer: Self-pay | Admitting: Radiation Oncology

## 2015-09-25 ENCOUNTER — Ambulatory Visit
Admission: RE | Admit: 2015-09-25 | Discharge: 2015-09-25 | Disposition: A | Discharge status: Home / Self Care | Payer: No Typology Code available for payment source | Source: Ambulatory Visit | Attending: Radiation Oncology | Admitting: Radiation Oncology

## 2015-09-25 VITALS — BP 129/48 | HR 85 | Temp 97.8°F | Resp 18 | Wt 191.9 lb

## 2015-09-25 DIAGNOSIS — C50411 Malignant neoplasm of upper-outer quadrant of right female breast: Secondary | ICD-10-CM

## 2015-09-25 DIAGNOSIS — Z510 Encounter for antineoplastic radiation therapy: Secondary | ICD-10-CM | POA: Diagnosis not present

## 2015-09-25 NOTE — Progress Notes (Signed)
Department of Radiation Oncology  Phone:  [phone] Fax:        [phone]  Weekly Treatment Note    Name: [name] [name] Date: 09/25/2015 MRN: [id number] DOB: [birth year]   Diagnosis:     ICD-9-CM ICD-10-CM   1. Breast cancer of upper-outer quadrant of right female breast ([place]) 174.4 C50.411      Current dose: 47.5 Gy  Current fraction:19   MEDICATIONS: Current Outpatient Prescriptions  Medication Sig Dispense Refill  . aspirin EC 81 MG tablet Take 81 mg by mouth daily.    Marland Kitchen atorvastatin (LIPITOR) 20 MG tablet Take 1 tablet (20 mg total) by mouth daily. 90 tablet 3  . glimepiride (AMARYL) 4 MG tablet Take 1 tablet (4 mg total) by mouth every morning. 90 tablet 3  . Glucosamine-Chondroitin (OSTEO BI-FLEX REGULAR STRENGTH PO) Take 1 tablet by mouth daily as needed (For joint health.). Reported on 08/17/2015    . latanoprost (XALATAN) 0.005 % ophthalmic solution Place 1 drop into both eyes at bedtime.  4  . losartan-hydrochlorothiazide (HYZAAR) 50-12.5 MG tablet Take 1 tablet by mouth daily. 90 tablet 3  . metFORMIN (GLUCOPHAGE) 500 MG tablet TAKE 2 TABLETS BY MOUTH EVERY MORNING AND 1 TABLET EVERY NIGHT AT BEDTIME 90 tablet 1  . ondansetron (ZOFRAN ODT) 4 MG disintegrating tablet Take 1 tablet (4 mg total) by mouth every 8 (eight) hours as needed for nausea or vomiting. (Patient not taking: Reported on 08/17/2015) 20 tablet 0  . ONE TOUCH ULTRA TEST test strip USE TO CHECK BLOOD SUGAR TWICE DAILY 100 each 2  . Tavaborole (KERYDIN) 5 % SOLN Apply 1 application topically at bedtime. Apply to toenails for fungal infection.    . traMADol (ULTRAM) 50 MG tablet Take 1 tablet (50 mg total) by mouth every 6 (six) hours as needed for moderate pain. (Patient not taking: Reported on 08/17/2015) 30 tablet 0   No current facility-administered medications for this encounter.      ALLERGIES: Percocet [oxycodone-acetaminophen]; Acetaminophen; Actos [pioglitazone hydrochloride]; Latex;  Lisinopril; Shrimp [shellfish allergy]; Zocor [simvastatin - high dose]; and Penicillins   LABORATORY DATA:  Lab Results  Component Value Date   WBC 5.0 07/31/2015   HGB 11.5 (L) 07/31/2015   HCT 35.6 (L) 07/31/2015   MCV 93.9 07/31/2015   PLT 178 07/31/2015   Lab Results  Component Value Date   NA 140 07/31/2015   K 3.4 (L) 07/31/2015   CL 107 07/31/2015   CO2 26 07/31/2015   Lab Results  Component Value Date   ALT 12 06/10/2015   AST 11 06/10/2015   ALKPHOS 83 06/10/2015   BILITOT 0.91 06/10/2015     NARRATIVE: [name] was seen today for weekly treatment management. The chart was checked and the patient's films were reviewed.  weekly right breast  19/20 completed  Mild erythema,skin intact, using radiaplex bid, mild shooting pains under arm and breast nipple is inverted 10:39 AM BP (!) 129/48 (BP Location: Right Leg, Patient Position: Sitting, Cuff Size: Normal)   Pulse 85   Temp 97.8 F (36.6 C) (Oral)   Resp 18   Wt 191 lb 14.4 oz (87 kg)   BMI 36.26 kg/m   1 mon f/u appt with Shona Simpson, PA 10/29/15  PHYSICAL EXAMINATION: weight is 191 lb 14.4 oz (87 kg). Her oral temperature is 97.8 F (36.6 C). Her blood pressure is 129/48 (abnormal) and her pulse is 85. Her respiration is 18.      Hyperpigmentation  in the per treatment area. No moist desquamation.  ASSESSMENT: The patient is doing satisfactorily with treatment.  PLAN: We will continue with the patient's radiation treatment as planned. The patient will finish her final fraction on Monday and then will follow-up in our clinic in 1 month.

## 2015-09-25 NOTE — Progress Notes (Signed)
Complex simulation note  Diagnosis: right-sided breast cancer  Narrative The patient has initially been planned to receive a course of whole breast radiation to a dose of 42.5 Gy in 17 fractions. The patient will now receive an additional boost to the seroma cavity which has been contoured. This will correspond to a boost of 7.5 Gy at 2.5 Gy per fraction. To accomplish this, an additional 3 customized blocks have been designed for this purpose. A complex isodose plan is requested to ensure that the target area is adequately covered with radiation dose and that the nearby normal structures such as the lung are adequately spared. The patient's final total dose will be 50 Gy.  ------------------------------------------------  Olivine Hiers S. Kayren Holck, MD, PhD   

## 2015-09-25 NOTE — Progress Notes (Addendum)
weekly right breast  19/20 completed  Mild erythema,skin intact, using radiaplex bid, mild shooting pains under arm and breast nipple is inverted 10:13 AM BP (!) 129/48 (BP Location: Right Leg, Patient Position: Sitting, Cuff Size: Normal)   Pulse 85   Temp 97.8 F (36.6 C) (Oral)   Resp 18   Wt 191 lb 14.4 oz (87 kg)   BMI 36.26 kg/m   1 mon f/u appt with Shona Simpson, PA 10/29/15

## 2015-09-28 ENCOUNTER — Ambulatory Visit
Admission: RE | Admit: 2015-09-28 | Discharge: 2015-09-28 | Disposition: A | Discharge status: Home / Self Care | Payer: No Typology Code available for payment source | Source: Ambulatory Visit | Attending: Radiation Oncology | Admitting: Radiation Oncology

## 2015-09-28 ENCOUNTER — Encounter: Payer: Self-pay | Admitting: Radiation Oncology

## 2015-09-28 DIAGNOSIS — Z510 Encounter for antineoplastic radiation therapy: Secondary | ICD-10-CM | POA: Diagnosis not present

## 2015-09-29 ENCOUNTER — Telehealth: Payer: Self-pay | Admitting: *Deleted

## 2015-09-29 DIAGNOSIS — C50411 Malignant neoplasm of upper-outer quadrant of right female breast: Secondary | ICD-10-CM

## 2015-09-29 NOTE — Telephone Encounter (Signed)
  Oncology Nurse Navigator Documentation  Navigator Location: [facility] (09/29/15 1300) Navigator Encounter Type: Telephone (09/29/15 1300) Telephone: [place] Call (09/29/15 1300)         Patient Visit Type: [id number] (09/29/15 1300) Treatment Phase: Final Radiation Tx (09/29/15 1300)                            Time Spent with Patient: 15 (09/29/15 1300)

## 2015-10-22 ENCOUNTER — Encounter: Payer: Self-pay | Admitting: Hematology and Oncology

## 2015-10-22 ENCOUNTER — Ambulatory Visit (HOSPITAL_BASED_OUTPATIENT_CLINIC_OR_DEPARTMENT_OTHER): Payer: No Typology Code available for payment source | Admitting: Hematology and Oncology

## 2015-10-22 ENCOUNTER — Telehealth: Payer: Self-pay | Admitting: Hematology and Oncology

## 2015-10-22 DIAGNOSIS — Z79811 Long term (current) use of aromatase inhibitors: Secondary | ICD-10-CM | POA: Diagnosis not present

## 2015-10-22 DIAGNOSIS — C50411 Malignant neoplasm of upper-outer quadrant of right female breast: Secondary | ICD-10-CM

## 2015-10-22 MED ORDER — ANASTROZOLE 1 MG PO TABS: 1.0000 mg | ORAL_TABLET | Freq: Every day | ORAL | 3 refills | Status: DC

## 2015-10-22 NOTE — Assessment & Plan Note (Addendum)
Right lumpectomy 07/09/2015: Invasive lobular cancer, grade 2, 1.9 cm, LCIS, ADH, lateral margin focally positive, 0/2 lymph nodes negative, ER 95%, PR 80%, Ki-67 15%, HER-2 negative, T1cN0 stage IA  Adjuvant radiation therapy 09/01/2015 to 09/28/2015  Treatment plan: Adjuvant antiestrogen therapy with anastrozole 1 mg by mouth daily 5-10 years  Anastrozole counseling:We discussed the risks and benefits of anti-estrogen therapy with aromatase inhibitors. These include but not limited to insomnia, hot flashes, mood changes, vaginal dryness, bone density loss, and weight gain. We strongly believe that the benefits far outweigh the risks. Patient understands these risks and consented to starting treatment. Planned treatment duration is 5-10 years.  Return to clinic in 3 months for toxicity check.

## 2015-10-22 NOTE — Progress Notes (Signed)
Patient Care Team: Eulas Post, MD as PCP - General (Family Medicine) Sylvan Cheese, NP as Nurse Practitioner (Hematology and Oncology)  DIAGNOSIS: Breast cancer of upper-outer quadrant of right female breast [facility])   Staging form: Breast, AJCC 7th Edition   - Clinical stage from 06/10/2015: Stage IIA (T2, N0, M0) - Unsigned  SUMMARY OF ONCOLOGIC HISTORY:   Breast cancer of upper-outer quadrant of right female breast ([place])   06/02/2015 Initial Diagnosis    Right breast biopsy 10:00 position: Invasive lobular cancer with LCIS, ER 95%, PR 80%, Ki-67 15%, HER-2 negative duration 1.61, ultrasound revealed a 1.9 x 1 x 1.4 cm lesion, T1 cN0 stage IA clinical stage      07/09/2015 Surgery    Right lumpectomy: Invasive lobular cancer, grade 2, 1.9 cm, LCIS, ADH, lateral margin focally positive, 0/2 lymph nodes negative, ER 95%, PR 80%, Ki-67 15%, HER-2 negative, T1cN0 stage IA      07/31/2015 Surgery    Reresection surgical margins: Clear, Benign      09/01/2015 - 09/28/2015 Radiation Therapy    Adjuvant radiation therapy       CHIEF COMPLIANT: Follow-up after radiation therapy  INTERVAL HISTORY: [name] [name] is a 81 year old with above-mentioned history of breast cancer treated with lumpectomy and had completed adjuvant radiation therapy. She is here for a follow-up. She reports mild discomfort in the breast and fatigue related to radiation. She denies any other complaints or concerns.  REVIEW OF SYSTEMS:   Constitutional: Denies fevers, chills or abnormal weight loss Eyes: Denies blurriness of vision Ears, nose, mouth, throat, and face: Denies mucositis or sore throat Respiratory: Denies cough, dyspnea or wheezes Cardiovascular: Denies palpitation, chest discomfort Gastrointestinal:  Denies nausea, heartburn or change in bowel habits Skin: Denies abnormal skin rashes Lymphatics: Denies new lymphadenopathy or easy bruising Neurological:Denies numbness, tingling or  new weaknesses Behavioral/Psych: Mood is stable, no new changes  Extremities: No lower extremity edema Breast: Radiation dermatitis All other systems were reviewed with the patient and are negative.  I have reviewed the past medical history, past surgical history, social history and family history with the patient and they are unchanged from previous note.  ALLERGIES:  is allergic to percocet [oxycodone-acetaminophen]; acetaminophen; actos [pioglitazone hydrochloride]; latex; lisinopril; shrimp [shellfish allergy]; zocor [simvastatin - high dose]; and penicillins.  MEDICATIONS:  Current Outpatient Prescriptions  Medication Sig Dispense Refill  . aspirin EC 81 MG tablet Take 81 mg by mouth daily.    Marland Kitchen atorvastatin (LIPITOR) 20 MG tablet Take 1 tablet (20 mg total) by mouth daily. 90 tablet 3  . glimepiride (AMARYL) 4 MG tablet Take 1 tablet (4 mg total) by mouth every morning. 90 tablet 3  . Glucosamine-Chondroitin (OSTEO BI-FLEX REGULAR STRENGTH PO) Take 1 tablet by mouth daily as needed (For joint health.). Reported on 08/17/2015    . latanoprost (XALATAN) 0.005 % ophthalmic solution Place 1 drop into both eyes at bedtime.  4  . losartan-hydrochlorothiazide (HYZAAR) 50-12.5 MG tablet Take 1 tablet by mouth daily. 90 tablet 3  . metFORMIN (GLUCOPHAGE) 500 MG tablet TAKE 2 TABLETS BY MOUTH EVERY MORNING AND 1 TABLET EVERY NIGHT AT BEDTIME 90 tablet 1  . ondansetron (ZOFRAN ODT) 4 MG disintegrating tablet Take 1 tablet (4 mg total) by mouth every 8 (eight) hours as needed for nausea or vomiting. (Patient not taking: Reported on 08/17/2015) 20 tablet 0  . ONE TOUCH ULTRA TEST test strip USE TO CHECK BLOOD SUGAR TWICE DAILY 100 each 2  .  Tavaborole (KERYDIN) 5 % SOLN Apply 1 application topically at bedtime. Apply to toenails for fungal infection.    . traMADol (ULTRAM) 50 MG tablet Take 1 tablet (50 mg total) by mouth every 6 (six) hours as needed for moderate pain. (Patient not taking: Reported on  08/17/2015) 30 tablet 0   No current facility-administered medications for this visit.     PHYSICAL EXAMINATION: ECOG PERFORMANCE STATUS: 1 - Symptomatic but completely ambulatory  Vitals:   10/22/15 1009  BP: 140/73  Pulse: 93  Resp: 18  Temp: 98.2 F (36.8 C)   Filed Weights   10/22/15 1009  Weight: 192 lb 11.2 oz (87.4 kg)    GENERAL:alert, no distress and comfortable SKIN: skin color, texture, turgor are normal, no rashes or significant lesions EYES: normal, Conjunctiva are pink and non-injected, sclera clear OROPHARYNX:no exudate, no erythema and lips, buccal mucosa, and tongue normal  NECK: supple, thyroid normal size, non-tender, without nodularity LYMPH:  no palpable lymphadenopathy in the cervical, axillary or inguinal LUNGS: clear to auscultation and percussion with normal breathing effort HEART: regular rate & rhythm and no murmurs and no lower extremity edema ABDOMEN:abdomen soft, non-tender and normal bowel sounds MUSCULOSKELETAL:no cyanosis of digits and no clubbing  NEURO: alert & oriented x 3 with fluent speech, no focal motor/sensory deficits EXTREMITIES: No lower extremity edema  LABORATORY DATA:  I have reviewed the data as listed   Chemistry      Component Value Date/Time   NA 140 07/31/2015 0648   NA 142 06/10/2015 1130   K 3.4 (L) 07/31/2015 0648   K 4.0 06/10/2015 1130   CL 107 07/31/2015 0648   CO2 26 07/31/2015 0648   CO2 25 06/10/2015 1130   BUN 6 07/31/2015 0648   BUN 15.4 06/10/2015 1130   CREATININE 0.70 07/31/2015 0648   CREATININE 0.9 06/10/2015 1130      Component Value Date/Time   CALCIUM 9.2 07/31/2015 0648   CALCIUM 9.3 06/10/2015 1130   ALKPHOS 83 06/10/2015 1130   AST 11 06/10/2015 1130   ALT 12 06/10/2015 1130   BILITOT 0.91 06/10/2015 1130       Lab Results  Component Value Date   WBC 5.0 07/31/2015   HGB 11.5 (L) 07/31/2015   HCT 35.6 (L) 07/31/2015   MCV 93.9 07/31/2015   PLT 178 07/31/2015   NEUTROABS 4.0  06/10/2015     ASSESSMENT & PLAN:  Breast cancer of upper-outer quadrant of right female breast ([place]) Right lumpectomy 07/09/2015: Invasive lobular cancer, grade 2, 1.9 cm, LCIS, ADH, lateral margin focally positive, 0/2 lymph nodes negative, ER 95%, PR 80%, Ki-67 15%, HER-2 negative, T1cN0 stage IA  Adjuvant radiation therapy 09/01/2015 to 09/28/2015  Treatment plan: Adjuvant antiestrogen therapy with anastrozole 1 mg by mouth daily 5-10 years  Anastrozole counseling:We discussed the risks and benefits of anti-estrogen therapy with aromatase inhibitors. These include but not limited to insomnia, hot flashes, mood changes, vaginal dryness, bone density loss, and weight gain. We strongly believe that the benefits far outweigh the risks. Patient understands these risks and consented to starting treatment. Planned treatment duration is 5-10 years.  Return to clinic in 3 months for toxicity check.    No orders of the defined types were placed in this encounter.  The patient has a good understanding of the overall plan. she agrees with it. she will call with any problems that may develop before the next visit here.   Rulon Eisenmenger, MD 10/22/15

## 2015-10-22 NOTE — Telephone Encounter (Signed)
appt made and avs printed °

## 2015-10-28 NOTE — Progress Notes (Signed)
  Radiation Oncology         [phone] ________________________________  Name: CHAUNTAE BULLEY MRN: [id number]  Date: 09/28/2015  DOB: 1934-04-11  End of Treatment Note  Diagnosis:   Right-sided breast cancer     Indication for treatment:  Curative       Radiation treatment dates:   09/01/2015 through 09/28/2015  Site/dose:   The patient initially received a dose of 42.5 Gy in 17 fractions to the breast using whole-breast tangent fields. This was delivered using a 3-D conformal technique. The patient then received a boost to the seroma. This delivered an additional 7.5 Gy in 3 fractions using a 3 field photon technique due to the depth of the seroma. The total dose was 50 Gy.  Narrative: The patient tolerated radiation treatment relatively well.   The patient had some expected skin irritation as she progressed during treatment. Moist desquamation was not present at the end of treatment.  Plan: The patient has completed radiation treatment. The patient will return to radiation oncology clinic for routine followup in one month. I advised the patient to call or return sooner if they have any questions or concerns related to their recovery or treatment. ________________________________  Jodelle Gross, M.D., Ph.D.

## 2015-10-29 ENCOUNTER — Ambulatory Visit
Admission: RE | Admit: 2015-10-29 | Discharge: 2015-10-29 | Disposition: A | Discharge status: Home / Self Care | Payer: No Typology Code available for payment source | Source: Ambulatory Visit | Attending: Radiation Oncology | Admitting: Radiation Oncology

## 2015-10-29 ENCOUNTER — Encounter: Payer: Self-pay | Admitting: Radiation Oncology

## 2015-10-29 VITALS — BP 139/67 | HR 75 | Temp 97.7°F | Ht 61.0 in | Wt 192.8 lb

## 2015-10-29 DIAGNOSIS — Z170 Estrogen receptor positive status [ER+]: Secondary | ICD-10-CM | POA: Diagnosis not present

## 2015-10-29 DIAGNOSIS — Z7984 Long term (current) use of oral hypoglycemic drugs: Secondary | ICD-10-CM | POA: Insufficient documentation

## 2015-10-29 DIAGNOSIS — Z809 Family history of malignant neoplasm, unspecified: Secondary | ICD-10-CM | POA: Diagnosis not present

## 2015-10-29 DIAGNOSIS — C50411 Malignant neoplasm of upper-outer quadrant of right female breast: Secondary | ICD-10-CM | POA: Insufficient documentation

## 2015-10-29 DIAGNOSIS — E785 Hyperlipidemia, unspecified: Secondary | ICD-10-CM | POA: Insufficient documentation

## 2015-10-29 DIAGNOSIS — Z510 Encounter for antineoplastic radiation therapy: Secondary | ICD-10-CM | POA: Insufficient documentation

## 2015-10-29 DIAGNOSIS — I129 Hypertensive chronic kidney disease with stage 1 through stage 4 chronic kidney disease, or unspecified chronic kidney disease: Secondary | ICD-10-CM | POA: Diagnosis not present

## 2015-10-29 DIAGNOSIS — N189 Chronic kidney disease, unspecified: Secondary | ICD-10-CM | POA: Insufficient documentation

## 2015-10-29 DIAGNOSIS — Z853 Personal history of malignant neoplasm of breast: Secondary | ICD-10-CM | POA: Insufficient documentation

## 2015-10-29 NOTE — Progress Notes (Addendum)
[name] [name] reports that she has occassional shooting pain in her right breast.  Note fading tan on her breast with intact skin.  Also reports intermittent fatigue. Reports goode appetite.  Arimidex started on 09/23/15 and is not experiencing hot flashes, but reports aching of right shoulder.   BP 139/67 (BP Location: Left Arm, Patient Position: Sitting, Cuff Size: Normal)   Pulse 75   Temp 97.7 F (36.5 C) (Oral)   Ht 5\' 1"  (1.549 m)   Wt 192 lb 12.8 oz (87.5 kg)   BMI 36.43 kg/m    Wt Readings from Last 3 Encounters:  10/29/15 192 lb 12.8 oz (87.5 kg)  10/22/15 192 lb 11.2 oz (87.4 kg)  09/25/15 191 lb 14.4 oz (87 kg)

## 2015-10-29 NOTE — Progress Notes (Signed)
Radiation Oncology         [phone] ________________________________  Name: [name] [name] MRN: [id number]  Date: 10/29/2015  DOB: 09/04/1934  Post Treatment Note  CC: Eulas Post, MD  Nicholas Lose, MD  Diagnosis:  Stage IA, T1cN0, ER/PR positive invasive lobular carcinoma of the right breast.  Interval Since Last Radiation:  4 weeks   09/01/2015 through 09/28/2015: The patient initially received a dose of 42.5 Gy in 17 fractions to the breast using whole-breast tangent fields. This was delivered using a 3-D conformal technique. The patient then received a boost to the seroma. This delivered an additional 7.5 Gy in 3 fractions using a 3 field photon technique due to the depth of the seroma. The total dose was 50 Gy.  Narrative:  The patient returns today for routine follow-up. She tolerated radiotherapy well but did not have any significant desquamation. She has met with Dr. Lindi Adie on 10/22/15 and has begun Anastrozole 63m daily.                        On review of systems, the patient states she is excited to get back to bowling next Monday. She denies any concerns with her skin, shortness of breath, chest pain, fevers, chills, or RUE issues. No other complaints are verbalized.  ALLERGIES:  is allergic to percocet [oxycodone-acetaminophen]; acetaminophen; actos [pioglitazone hydrochloride]; latex; lisinopril; shrimp [shellfish allergy]; zocor [simvastatin - high dose]; and penicillins.  Meds: Current Outpatient Prescriptions  Medication Sig Dispense Refill  . anastrozole (ARIMIDEX) 1 MG tablet Take 1 tablet (1 mg total) by mouth daily. 90 tablet 3  . aspirin EC 81 MG tablet Take 81 mg by mouth daily.    .Marland Kitchenatorvastatin (LIPITOR) 20 MG tablet Take 1 tablet (20 mg total) by mouth daily. 90 tablet 3  . glimepiride (AMARYL) 4 MG tablet Take 1 tablet (4 mg total) by mouth every morning. 90 tablet 3  . Glucosamine-Chondroitin (OSTEO BI-FLEX REGULAR STRENGTH PO) Take 1 tablet by mouth  daily as needed (For joint health.). Reported on 08/17/2015    . latanoprost (XALATAN) 0.005 % ophthalmic solution Place 1 drop into both eyes at bedtime.  4  . losartan-hydrochlorothiazide (HYZAAR) 50-12.5 MG tablet Take 1 tablet by mouth daily. 90 tablet 3  . metFORMIN (GLUCOPHAGE) 500 MG tablet TAKE 2 TABLETS BY MOUTH EVERY MORNING AND 1 TABLET EVERY NIGHT AT BEDTIME 90 tablet 1  . ondansetron (ZOFRAN ODT) 4 MG disintegrating tablet Take 1 tablet (4 mg total) by mouth every 8 (eight) hours as needed for nausea or vomiting. (Patient not taking: Reported on 08/17/2015) 20 tablet 0  . ONE TOUCH ULTRA TEST test strip USE TO CHECK BLOOD SUGAR TWICE DAILY 100 each 2  . Tavaborole (KERYDIN) 5 % SOLN Apply 1 application topically at bedtime. Apply to toenails for fungal infection.    . traMADol (ULTRAM) 50 MG tablet Take 1 tablet (50 mg total) by mouth every 6 (six) hours as needed for moderate pain. (Patient not taking: Reported on 08/17/2015) 30 tablet 0   No current facility-administered medications for this encounter.     Physical Findings:  vitals were not taken for this visit. In general this is a well appearing African American female in no acute distress. She's alert and oriented x4 and appropriate throughout the examination. Cardiopulmonary assessment is negative for acute distress and she exhibits normal effort. Her right breast is intact without desquamation, mild hyperpigmentation is noted.  Lab Findings: Lab  Results  Component Value Date   WBC 5.0 07/31/2015   HGB 11.5 (L) 07/31/2015   HCT 35.6 (L) 07/31/2015   MCV 93.9 07/31/2015   PLT 178 07/31/2015     Radiographic Findings: No results found.  Impression/Plan: 1. Stage IA, T1cN0, ER/PR positive invasive lobular carcinoma of the right breast. The patient has done well since radiotherapy, and continues on Arimidex with Dr. Lindi Adie. She will follow up with [place] as needed moving forward. 2. Survivorship. The patient has been  counseled on the role of survivorship, a referral is placed for survivorship clinic.     Carola Rhine, PAC

## 2015-10-29 NOTE — Addendum Note (Signed)
Encounter addended by: Benn Moulder, RN on: 10/29/2015  7:27 PM<BR>    Actions taken: Charge Capture section accepted

## 2015-11-12 ENCOUNTER — Telehealth: Payer: Self-pay | Admitting: *Deleted

## 2015-11-12 NOTE — Telephone Encounter (Signed)
Received call from pt stating that she wants to cancel appt for 2 pm on 11/16/15.  Informed that this was with survivorship & she doesn't think she needs to be seen at least not this soon.  She reports other appts that she needs to go to.  Will cancel appt & inform Mike Craze NP.

## 2015-11-12 NOTE — Telephone Encounter (Signed)
Thank you so much for letting me know and for taking care of cancelling her appointment.  I am happy to see her anytime in the future, if needed.   Mike Craze, NP [place] [phone]

## 2015-11-14 ENCOUNTER — Other Ambulatory Visit: Payer: Self-pay | Admitting: Family Medicine

## 2015-11-16 ENCOUNTER — Encounter: Payer: No Typology Code available for payment source | Admitting: Adult Health

## 2015-11-20 ENCOUNTER — Ambulatory Visit (INDEPENDENT_AMBULATORY_CARE_PROVIDER_SITE_OTHER): Payer: No Typology Code available for payment source | Admitting: Family Medicine

## 2015-11-20 VITALS — BP 140/80 | HR 92 | Temp 97.6°F | Ht 61.0 in | Wt 187.9 lb

## 2015-11-20 DIAGNOSIS — C50411 Malignant neoplasm of upper-outer quadrant of right female breast: Secondary | ICD-10-CM

## 2015-11-20 DIAGNOSIS — E785 Hyperlipidemia, unspecified: Secondary | ICD-10-CM

## 2015-11-20 DIAGNOSIS — I10 Essential (primary) hypertension: Secondary | ICD-10-CM

## 2015-11-20 DIAGNOSIS — E1165 Type 2 diabetes mellitus with hyperglycemia: Secondary | ICD-10-CM

## 2015-11-20 LAB — POCT GLYCOSYLATED HEMOGLOBIN (HGB A1C): Hemoglobin A1C: 7.8

## 2015-11-20 MED ORDER — ONETOUCH LANCETS MISC: 5 refills | Status: AC

## 2015-11-20 NOTE — Progress Notes (Signed)
Pre visit review using our clinic review tool, if applicable. No additional management support is needed unless otherwise documented below in the visit note. 

## 2015-11-20 NOTE — Progress Notes (Signed)
Subjective:     Patient ID: [name] [name], female   DOB: 30-Jun-1934, 81 y.o.   MRN: [id number]  HPI Patient seen for medical follow-up. Last visit she was noted to have firm mass right breast. This did prove to be breast cancer. She was diagnosed with stage II a breast cancer as undergone lumpectomy and is completed course of radiation therapy. She's done very well overall.  Type 2 diabetes. Not monitoring regularly. She thinks her home monitor may be broken. Last A1c 7.4%. She takes combination of metformin and glimepiride.  Hypertension treated with losartan HCTZ. She remains on Lipitor for hyperlipidemia. Recent lipids were very well controlled. She's not had pneumonia vaccine documentation and she declines flu vaccine as well.  Past Medical History:  Diagnosis Date  . Arthritis   . Breast cancer of upper-outer quadrant of right female breast ([place]) 06/04/2015  . Chronic kidney disease    stones  . Diabetes mellitus   . Hyperlipidemia   . Hypertension   . PONV (postoperative nausea and vomiting)   . UTI (urinary tract infection)    Past Surgical History:  Procedure Laterality Date  . ABDOMINAL HYSTERECTOMY  1980  . CATARACT EXTRACTION    . CHOLECYSTECTOMY  1989  . COLONOSCOPY    . RADIOACTIVE SEED GUIDED MASTECTOMY WITH AXILLARY SENTINEL LYMPH NODE BIOPSY Right 07/09/2015   Procedure: RADIOACTIVE SEED GUIDED PARTIAL MASTECTOMY WITH AXILLARY SENTINEL LYMPH NODE BIOPSY;  Surgeon: Erroll Luna, MD;  Location: [facility];  Service: General;  Laterality: Right;  . RE-EXCISION OF BREAST LUMPECTOMY Right 07/31/2015   Procedure: RE-EXCISION OF RIGHT BREAST LUMPECTOMY;  Surgeon: Erroll Luna, MD;  Location: [place];  Service: General;  Laterality: Right;  . TONSILLECTOMY  1953    reports that she has never smoked. She has never used smokeless tobacco. She reports that she does not drink alcohol or use drugs. family history includes Cancer in her mother; Diabetes in her  brother and sister; Heart disease (age of onset: 63) in her father. Allergies  Allergen Reactions  . Percocet [Oxycodone-Acetaminophen] Nausea And Vomiting  . Acetaminophen Nausea And Vomiting and Other (See Comments)    Allergic to Tylenol #3  . Actos [Pioglitazone Hydrochloride] Hives  . Latex Swelling and Other (See Comments)    Lip swelling  . Lisinopril Cough  . Shrimp [Shellfish Allergy] Itching  . Zocor [Simvastatin - High Dose] Itching  . Penicillins Rash and Other (See Comments)    Has patient had a PCN reaction causing immediate rash, facial/tongue/throat swelling, SOB or lightheadedness with hypotension: yes Has patient had a PCN reaction causing severe rash involving mucus membranes or skin necrosis: no Has patient had a PCN reaction that required hospitalization no Has patient had a PCN reaction occurring within the last 10 years: no If all of the above answers are "NO", then may proceed with Cephalosporin use.      Review of Systems  Constitutional: Negative for fatigue and unexpected weight change.  Eyes: Negative for visual disturbance.  Respiratory: Negative for cough, chest tightness, shortness of breath and wheezing.   Cardiovascular: Negative for chest pain, palpitations and leg swelling.  Endocrine: Negative for polydipsia and polyuria.  Genitourinary: Negative for dysuria.  Neurological: Negative for dizziness, seizures, syncope, weakness, light-headedness and headaches.       Objective:   Physical Exam  Constitutional: She appears well-developed and well-nourished.  Eyes: Pupils are equal, round, and reactive to light.  Neck: Neck supple. No JVD present. No thyromegaly  present.  Cardiovascular: Normal rate and regular rhythm.  Exam reveals no gallop.   Pulmonary/Chest: Effort normal and breath sounds normal. No respiratory distress. She has no wheezes. She has no rales.  Musculoskeletal: She exhibits no edema.  Neurological: She is alert.        Assessment:     #1 type 2 diabetes. History of slightly suboptimal control  #2 hypertension stable and at goal  #3 hyperlipidemia  #4 right breast cancer treated with recent lumpectomy and radiation therapy    Plan:     -We recommended both flu vaccine as well as pneumonia vaccine but she declines both -Repeat hemoglobin A1c.  Hemoglobin A1c 7.8%. We elected to keep her medications the same and reassess in 6 months -Provide patient with new home glucose monitor  Eulas Post MD [facility]

## 2015-11-23 ENCOUNTER — Other Ambulatory Visit: Payer: Self-pay

## 2015-11-23 MED ORDER — ONETOUCH ULTRA SYSTEM W/DEVICE KIT: PACK | 0 refills | Status: AC

## 2015-11-23 MED ORDER — GLUCOSE BLOOD VI STRP: ORAL_STRIP | 2 refills | Status: DC

## 2016-01-19 NOTE — Assessment & Plan Note (Signed)
Right lumpectomy 07/09/2015: Invasive lobular cancer, grade 2, 1.9 cm, LCIS, ADH, lateral margin focally positive, 0/2 lymph nodes negative, ER 95%, PR 80%, Ki-67 15%, HER-2 negative, T1cN0 stage IA  Adjuvant radiation therapy 09/01/2015 to 09/28/2015  Treatment plan: Adjuvant antiestrogen therapy with anastrozole 1 mg by mouth daily 5-10 years Anastrozole Toxicities:  RTC in 6 months for follow up.

## 2016-01-20 ENCOUNTER — Encounter: Payer: Self-pay | Admitting: Hematology and Oncology

## 2016-01-20 ENCOUNTER — Ambulatory Visit (HOSPITAL_BASED_OUTPATIENT_CLINIC_OR_DEPARTMENT_OTHER): Payer: No Typology Code available for payment source | Admitting: Hematology and Oncology

## 2016-01-20 DIAGNOSIS — Z79811 Long term (current) use of aromatase inhibitors: Secondary | ICD-10-CM

## 2016-01-20 DIAGNOSIS — M2550 Pain in unspecified joint: Secondary | ICD-10-CM | POA: Diagnosis not present

## 2016-01-20 DIAGNOSIS — C50411 Malignant neoplasm of upper-outer quadrant of right female breast: Secondary | ICD-10-CM

## 2016-01-20 DIAGNOSIS — M791 Myalgia: Secondary | ICD-10-CM | POA: Diagnosis not present

## 2016-01-20 DIAGNOSIS — N951 Menopausal and female climacteric states: Secondary | ICD-10-CM | POA: Diagnosis not present

## 2016-01-20 DIAGNOSIS — Z170 Estrogen receptor positive status [ER+]: Principal | ICD-10-CM

## 2016-01-20 NOTE — Progress Notes (Signed)
Patient Care Team: Eulas Post, MD as PCP - General (Family Medicine) Sylvan Cheese, NP as Nurse Practitioner (Hematology and Oncology)  DIAGNOSIS:  Encounter Diagnosis  Name Primary?  . Malignant neoplasm of upper-outer quadrant of right breast in female, estrogen receptor positive ([place])     SUMMARY OF ONCOLOGIC HISTORY:   Breast cancer of upper-outer quadrant of right female breast ([place])   06/02/2015 Initial Diagnosis    Right breast biopsy 10:00 position: Invasive lobular cancer with LCIS, ER 95%, PR 80%, Ki-67 15%, HER-2 negative duration 1.61, ultrasound revealed a 1.9 x 1 x 1.4 cm lesion, T1 cN0 stage IA clinical stage      07/09/2015 Surgery    Right lumpectomy: Invasive lobular cancer, grade 2, 1.9 cm, LCIS, ADH, lateral margin focally positive, 0/2 lymph nodes negative, ER 95%, PR 80%, Ki-67 15%, HER-2 negative, T1cN0 stage IA      07/31/2015 Surgery    Reresection surgical margins: Clear, Benign      09/01/2015 - 09/28/2015 Radiation Therapy    Adjuvant radiation therapy      10/22/2015 -  Anti-estrogen oral therapy    Anastrozole 1 mg daily       CHIEF COMPLIANT: Follow-up on anastrozole therapy  INTERVAL HISTORY: [name] [name] is a 81 year old with above-mentioned history of right breast cancer treated with lumpectomy, radiation and is currently on anastrozole. She is here for toxicity evaluation on anastrozole therapy. She does report occasional hot flashes and myalgias. She denies any lumps or nodules in the breast.  REVIEW OF SYSTEMS:   Constitutional: Denies fevers, chills or abnormal weight loss Eyes: Denies blurriness of vision Ears, nose, mouth, throat, and face: Denies mucositis or sore throat Respiratory: Denies cough, dyspnea or wheezes Cardiovascular: Denies palpitation, chest discomfort Gastrointestinal:  Denies nausea, heartburn or change in bowel habits Skin: Denies abnormal skin rashes Lymphatics: Denies new lymphadenopathy or  easy bruising Neurological:Denies numbness, tingling or new weaknesses Behavioral/Psych: Mood is stable, no new changes  Extremities: No lower extremity edema Breast:  denies any pain or lumps or nodules in either breasts All other systems were reviewed with the patient and are negative.  I have reviewed the past medical history, past surgical history, social history and family history with the patient and they are unchanged from previous note.  ALLERGIES:  is allergic to percocet [oxycodone-acetaminophen]; acetaminophen; actos [pioglitazone hydrochloride]; latex; lisinopril; shrimp [shellfish allergy]; zocor [simvastatin - high dose]; and penicillins.  MEDICATIONS:  Current Outpatient Prescriptions  Medication Sig Dispense Refill  . anastrozole (ARIMIDEX) 1 MG tablet Take 1 tablet (1 mg total) by mouth daily. 90 tablet 3  . aspirin EC 81 MG tablet Take 81 mg by mouth daily.    Marland Kitchen atorvastatin (LIPITOR) 20 MG tablet Take 1 tablet (20 mg total) by mouth daily. 90 tablet 3  . Blood Glucose Monitoring Suppl (ONE TOUCH ULTRA SYSTEM KIT) w/Device KIT Use as directed. DX: E11.65. 1 each 0  . glimepiride (AMARYL) 4 MG tablet TAKE 1 TABLET BY MOUTH EVERY MORNING 90 tablet 2  . Glucosamine-Chondroitin (OSTEO BI-FLEX REGULAR STRENGTH PO) Take 1 tablet by mouth daily as needed (For joint health.). Reported on 08/17/2015    . glucose blood (ONE TOUCH ULTRA TEST) test strip USE TO CHECK BLOOD SUGAR TWICE DAILY. DX: E11.65 100 each 2  . latanoprost (XALATAN) 0.005 % ophthalmic solution Place 1 drop into both eyes at bedtime.  4  . losartan-hydrochlorothiazide (HYZAAR) 50-12.5 MG tablet Take 1 tablet by mouth daily. [place]  tablet 3  . metFORMIN (GLUCOPHAGE) 500 MG tablet TAKE 2 TABLETS BY MOUTH EVERY MORNING AND 1 TABLET EVERY NIGHT AT BEDTIME 90 tablet 1  . ONE TOUCH LANCETS MISC Check Blood Sugars twice per day. DX: E11.65 100 each 5  . Tavaborole (KERYDIN) 5 % SOLN Apply 1 application topically at bedtime.  Apply to toenails for fungal infection.     No current facility-administered medications for this visit.     PHYSICAL EXAMINATION: ECOG PERFORMANCE STATUS: 1 - Symptomatic but completely ambulatory  Vitals:   01/20/16 0936  BP: 134/64  Pulse: 90  Resp: 18  Temp: 98 F (36.7 C)   Filed Weights   01/20/16 0936  Weight: 189 lb 1.6 oz (85.8 kg)    GENERAL:alert, no distress and comfortable SKIN: skin color, texture, turgor are normal, no rashes or significant lesions EYES: normal, Conjunctiva are pink and non-injected, sclera clear OROPHARYNX:no exudate, no erythema and lips, buccal mucosa, and tongue normal  NECK: supple, thyroid normal size, non-tender, without nodularity LYMPH:  no palpable lymphadenopathy in the cervical, axillary or inguinal LUNGS: clear to auscultation and percussion with normal breathing effort HEART: regular rate & rhythm and no murmurs and no lower extremity edema ABDOMEN:abdomen soft, non-tender and normal bowel sounds MUSCULOSKELETAL:no cyanosis of digits and no clubbing  NEURO: alert & oriented x 3 with fluent speech, no focal motor/sensory deficits EXTREMITIES: No lower extremity edema  LABORATORY DATA:  I have reviewed the data as listed   Chemistry      Component Value Date/Time   NA 140 07/31/2015 0648   NA 142 06/10/2015 1130   K 3.4 (L) 07/31/2015 0648   K 4.0 06/10/2015 1130   CL 107 07/31/2015 0648   CO2 26 07/31/2015 0648   CO2 25 06/10/2015 1130   BUN 6 07/31/2015 0648   BUN 15.4 06/10/2015 1130   CREATININE 0.70 07/31/2015 0648   CREATININE 0.9 06/10/2015 1130      Component Value Date/Time   CALCIUM 9.2 07/31/2015 0648   CALCIUM 9.3 06/10/2015 1130   ALKPHOS 83 06/10/2015 1130   AST 11 06/10/2015 1130   ALT 12 06/10/2015 1130   BILITOT 0.91 06/10/2015 1130       Lab Results  Component Value Date   WBC 5.0 07/31/2015   HGB 11.5 (L) 07/31/2015   HCT 35.6 (L) 07/31/2015   MCV 93.9 07/31/2015   PLT 178 07/31/2015    NEUTROABS 4.0 06/10/2015    ASSESSMENT & PLAN:  Breast cancer of upper-outer quadrant of right female breast ([place]) Right lumpectomy 07/09/2015: Invasive lobular cancer, grade 2, 1.9 cm, LCIS, ADH, lateral margin focally positive, 0/2 lymph nodes negative, ER 95%, PR 80%, Ki-67 15%, HER-2 negative, T1cN0 stage IA  Adjuvant radiation therapy 09/01/2015 to 09/28/2015  Treatment plan: Adjuvant antiestrogen therapy with anastrozole 1 mg by mouth daily 5-10 years Anastrozole Toxicities: 1. Occasional hot flashes 2. Myalgias/arthralgias   Patient will need a bone density test for evaluation for osteoporosis.   RTC in 6 months for follow up.   No orders of the defined types were placed in this encounter.  The patient has a good understanding of the overall plan. she agrees with it. she will call with any problems that may develop before the next visit here.   Rulon Eisenmenger, MD 01/20/16

## 2016-04-14 ENCOUNTER — Other Ambulatory Visit: Payer: Self-pay | Admitting: Family Medicine

## 2016-05-18 ENCOUNTER — Encounter: Payer: Self-pay | Admitting: Family Medicine

## 2016-05-18 ENCOUNTER — Ambulatory Visit (INDEPENDENT_AMBULATORY_CARE_PROVIDER_SITE_OTHER): Payer: Managed Care, Other (non HMO) | Admitting: Family Medicine

## 2016-05-18 ENCOUNTER — Ambulatory Visit (INDEPENDENT_AMBULATORY_CARE_PROVIDER_SITE_OTHER)
Admission: RE | Admit: 2016-05-18 | Discharge: 2016-05-18 | Disposition: A | Discharge status: Home / Self Care | Payer: Managed Care, Other (non HMO) | Source: Ambulatory Visit | Attending: Family Medicine | Admitting: Family Medicine

## 2016-05-18 ENCOUNTER — Other Ambulatory Visit: Payer: Self-pay | Admitting: Family Medicine

## 2016-05-18 VITALS — BP 118/70 | HR 75 | Temp 98.7°F | Wt 187.3 lb

## 2016-05-18 DIAGNOSIS — M25562 Pain in left knee: Principal | ICD-10-CM

## 2016-05-18 DIAGNOSIS — E785 Hyperlipidemia, unspecified: Secondary | ICD-10-CM

## 2016-05-18 DIAGNOSIS — G8929 Other chronic pain: Secondary | ICD-10-CM

## 2016-05-18 DIAGNOSIS — E1165 Type 2 diabetes mellitus with hyperglycemia: Secondary | ICD-10-CM

## 2016-05-18 DIAGNOSIS — I10 Essential (primary) hypertension: Secondary | ICD-10-CM

## 2016-05-18 LAB — POCT GLYCOSYLATED HEMOGLOBIN (HGB A1C): HEMOGLOBIN A1C: 7.1

## 2016-05-18 LAB — BASIC METABOLIC PANEL
BUN: 10 mg/dL (ref 6–23)
CALCIUM: 9.3 mg/dL (ref 8.4–10.5)
CHLORIDE: 104 meq/L (ref 96–112)
CO2: 29 mEq/L (ref 19–32)
Creatinine, Ser: 0.7 mg/dL (ref 0.40–1.20)
GFR: 103.49 mL/min (ref >60.00)
Glucose, Bld: 117 mg/dL — ABNORMAL HIGH (ref 70–99)
Potassium: 3.7 mEq/L (ref 3.5–5.1)
Sodium: 140 mEq/L (ref 135–145)

## 2016-05-18 LAB — LIPID PANEL
CHOLESTEROL: 117 mg/dL (ref 0–200)
HDL: 60.9 mg/dL (ref >39.00)
LDL CALC: 44 mg/dL (ref 0–99)
NonHDL: 55.85
Total CHOL/HDL Ratio: 2
Triglycerides: 59 mg/dL (ref 0.0–149.0)
VLDL: 11.8 mg/dL (ref 0.0–40.0)

## 2016-05-18 LAB — HEPATIC FUNCTION PANEL
ALT: 11 U/L (ref 0–35)
AST: 13 U/L (ref 0–37)
Albumin: 4.1 g/dL (ref 3.5–5.2)
Alkaline Phosphatase: 80 U/L (ref 39–117)
BILIRUBIN DIRECT: 0.2 mg/dL (ref 0.0–0.3)
BILIRUBIN TOTAL: 1 mg/dL (ref 0.2–1.2)
Total Protein: 6.4 g/dL (ref 6.0–8.3)

## 2016-05-18 NOTE — Progress Notes (Signed)
Subjective:     Patient ID: [name] [name], female   DOB: [birth year], 82 y.o.   MRN: [id number]  HPI Patient seen for several issues as follows. She has chronic problems including obesity, history of breast cancer, type 2 diabetes, hypertension, dyslipidemia, glaucoma.  Progressive knee pains over several months. No injury. Frequent stiffness. Symptoms are worse with cold and damp weather. Sometimes has sensation of knee giving way. She still walks for exercise-about 3 miles per day. Occasionally takes Tylenol  Hypertension treated with losartan HCTZ. Last potassium slightly low at 3.4. A1c today 7.1%. Does not monitor blood sugars regularly. No recent hypoglycemic symptoms. No polyuria or polydipsia.  History of hyperlipidemia treated with Lipitor. She denies any general myalgias. No recent chest pains. Compliant with all medications. Appetite and weight stable.  Past Medical History:  Diagnosis Date  . Arthritis   . Breast cancer of upper-outer quadrant of right female breast ([place]) 06/04/2015  . Chronic kidney disease    stones  . Diabetes mellitus   . Hyperlipidemia   . Hypertension   . PONV (postoperative nausea and vomiting)   . UTI (urinary tract infection)    Past Surgical History:  Procedure Laterality Date  . ABDOMINAL HYSTERECTOMY  1980  . CATARACT EXTRACTION    . CHOLECYSTECTOMY  1989  . COLONOSCOPY    . RADIOACTIVE SEED GUIDED MASTECTOMY WITH AXILLARY SENTINEL LYMPH NODE BIOPSY Right 07/09/2015   Procedure: RADIOACTIVE SEED GUIDED PARTIAL MASTECTOMY WITH AXILLARY SENTINEL LYMPH NODE BIOPSY;  Surgeon: Erroll Luna, MD;  Location: [place];  Service: General;  Laterality: Right;  . RE-EXCISION OF BREAST LUMPECTOMY Right 07/31/2015   Procedure: RE-EXCISION OF RIGHT BREAST LUMPECTOMY;  Surgeon: Erroll Luna, MD;  Location: [place];  Service: General;  Laterality: Right;  . TONSILLECTOMY  1953    reports that she has never smoked. She has never used smokeless  tobacco. She reports that she does not drink alcohol or use drugs. family history includes Cancer in her mother; Diabetes in her brother and sister; Heart disease (age of onset: 60) in her father.    Review of Systems  Constitutional: Negative for activity change, appetite change, fatigue, fever and unexpected weight change.  HENT: Negative for ear pain, hearing loss, sore throat and trouble swallowing.   Eyes: Negative for visual disturbance.  Respiratory: Negative for cough and shortness of breath.   Cardiovascular: Negative for chest pain and palpitations.  Gastrointestinal: Negative for abdominal pain, blood in stool, constipation and diarrhea.  Endocrine: Negative for polydipsia and polyuria.  Genitourinary: Negative for dysuria and hematuria.  Musculoskeletal: Positive for arthralgias. Negative for back pain and myalgias.  Skin: Negative for rash.  Neurological: Negative for dizziness, syncope and headaches.  Hematological: Negative for adenopathy.  Psychiatric/Behavioral: Negative for confusion and dysphoric mood.       Objective:   Physical Exam  Constitutional: She appears well-developed and well-nourished.  Eyes: Pupils are equal, round, and reactive to light.  Neck: Neck supple. No JVD present. No thyromegaly present.  Cardiovascular: Normal rate and regular rhythm.  Exam reveals no gallop.   Pulmonary/Chest: Effort normal and breath sounds normal. No respiratory distress. She has no wheezes. She has no rales.  Musculoskeletal: She exhibits no edema.  Patient has no tenderness left knee. Good range of motion but significant crepitus. No effusion. No warmth. No ecchymosis.  Neurological: She is alert.  Skin:  Feet reveal no skin lesions. Good distal foot pulses. Good capillary refill. No calluses. Normal sensation  with monofilament testing.  Does have bilateral bunions but no callus or ulceration        Assessment:     #1 type 2 diabetes with improved control with  hemoglobin A1c 7.1%  #2 hypertension stable and at goal  #3 dyslipidemia  #4 chronic left knee pain. Suspect osteoarthritis    Plan:     -Check labs with basic metabolic panel, K9V as above, lipid panel, hepatic panel -Increase potassium rich foods -Obtain x-rays left knee -We discussed potential treatment options for left knee pain and she will start with Tylenol. Could consider cautious use of topical diclofenac. We also could offer steroid injection if pain progressing  -Routine follow-up in 6 months and sooner as needed -Needs to set up eye exam and patient was reminded of this today  Eulas Post MD [facility]

## 2016-05-18 NOTE — Progress Notes (Signed)
Pre visit review using our clinic review tool, if applicable. No additional management support is needed unless otherwise documented below in the visit note. 

## 2016-06-07 ENCOUNTER — Other Ambulatory Visit: Payer: Self-pay | Admitting: Family Medicine

## 2016-06-11 ENCOUNTER — Other Ambulatory Visit: Payer: Self-pay | Admitting: Family Medicine

## 2016-07-12 ENCOUNTER — Telehealth: Payer: Self-pay

## 2016-07-12 NOTE — Telephone Encounter (Signed)
Spoke with patient and she is aware of her new appt,moved due to bmdc

## 2016-07-19 ENCOUNTER — Ambulatory Visit: Payer: No Typology Code available for payment source | Admitting: Hematology and Oncology

## 2016-07-20 ENCOUNTER — Ambulatory Visit: Payer: No Typology Code available for payment source | Admitting: Hematology and Oncology

## 2016-07-26 NOTE — Assessment & Plan Note (Signed)
Right lumpectomy 07/09/2015: Invasive lobular cancer, grade 2, 1.9 cm, LCIS, ADH, lateral margin focally positive, 0/2 lymph nodes negative, ER 95%, PR 80%, Ki-67 15%, HER-2 negative, T1cN0 stage IA  Adjuvant radiation therapy 09/01/2015 to 09/28/2015  Treatment plan: Adjuvant antiestrogen therapy with anastrozole 1 mg by mouth daily 5-10 years Anastrozole Toxicities: 1. Occasional hot flashes 2. Myalgias/arthralgias   Patient will need a bone density test for evaluation for osteoporosis.   RTC in 1 year for follow up. 

## 2016-07-27 ENCOUNTER — Encounter: Payer: Self-pay | Admitting: Hematology and Oncology

## 2016-07-27 ENCOUNTER — Other Ambulatory Visit: Payer: Self-pay | Admitting: Hematology and Oncology

## 2016-07-27 ENCOUNTER — Ambulatory Visit (HOSPITAL_BASED_OUTPATIENT_CLINIC_OR_DEPARTMENT_OTHER): Payer: Managed Care, Other (non HMO) | Admitting: Hematology and Oncology

## 2016-07-27 ENCOUNTER — Other Ambulatory Visit: Payer: Self-pay

## 2016-07-27 VITALS — BP 147/60 | HR 91 | Temp 98.3°F | Resp 18 | Ht 61.0 in | Wt 188.6 lb

## 2016-07-27 DIAGNOSIS — C50411 Malignant neoplasm of upper-outer quadrant of right female breast: Secondary | ICD-10-CM

## 2016-07-27 DIAGNOSIS — Z780 Asymptomatic menopausal state: Secondary | ICD-10-CM

## 2016-07-27 DIAGNOSIS — I890 Lymphedema, not elsewhere classified: Secondary | ICD-10-CM

## 2016-07-27 DIAGNOSIS — Z170 Estrogen receptor positive status [ER+]: Principal | ICD-10-CM

## 2016-07-27 DIAGNOSIS — Z79811 Long term (current) use of aromatase inhibitors: Secondary | ICD-10-CM | POA: Diagnosis not present

## 2016-07-27 MED ORDER — ANASTROZOLE 1 MG PO TABS: 1.0000 mg | ORAL_TABLET | Freq: Every day | ORAL | 3 refills | Status: DC

## 2016-07-27 NOTE — Progress Notes (Signed)
Patient Care Team: Eulas Post, MD as PCP - General (Family Medicine) Sylvan Cheese, NP as Nurse Practitioner (Hematology and Oncology)  DIAGNOSIS:  Encounter Diagnoses  Name Primary?  . Malignant neoplasm of upper-outer quadrant of right breast in female, estrogen receptor positive ([place])   . Post-menopausal Yes    SUMMARY OF ONCOLOGIC HISTORY:   Breast cancer of upper-outer quadrant of right female breast ([place])   06/02/2015 Initial Diagnosis    Right breast biopsy 10:00 position: Invasive lobular cancer with LCIS, ER 95%, PR 80%, Ki-67 15%, HER-2 negative duration 1.61, ultrasound revealed a 1.9 x 1 x 1.4 cm lesion, T1 cN0 stage IA clinical stage      07/09/2015 Surgery    Right lumpectomy: Invasive lobular cancer, grade 2, 1.9 cm, LCIS, ADH, lateral margin focally positive, 0/2 lymph nodes negative, ER 95%, PR 80%, Ki-67 15%, HER-2 negative, T1cN0 stage IA      07/31/2015 Surgery    Reresection surgical margins: Clear, Benign      09/01/2015 - 09/28/2015 Radiation Therapy    Adjuvant radiation therapy      10/22/2015 -  Anti-estrogen oral therapy    Anastrozole 1 mg daily       CHIEF COMPLIANT: Follow-up on anastrozole therapy  INTERVAL HISTORY: [name] [name] is a 82 year old with above-mentioned history of right breast cancer treated with lumpectomy and radiation and is currently on anastrozole therapy. She complains about anastrozole causes her to have occasional hot flashes. She denies any major myalgias. She occasionally forgets to take the medication. She denies any lumps or nodules in the breasts. She has not yet done a mammogram for this year.  REVIEW OF SYSTEMS:   Constitutional: Denies fevers, chills or abnormal weight loss Eyes: Denies blurriness of vision Ears, nose, mouth, throat, and face: Denies mucositis or sore throat Respiratory: Denies cough, dyspnea or wheezes Cardiovascular: Denies palpitation, chest discomfort Gastrointestinal:   Denies nausea, heartburn or change in bowel habits Skin: Denies abnormal skin rashes Lymphatics: Denies new lymphadenopathy or easy bruising Neurological:Denies numbness, tingling or new weaknesses Behavioral/Psych: Mood is stable, no new changes  Extremities: No lower extremity edema Breast:  She does complain of occasional swelling of the right breast which gets better with massaging. All other systems were reviewed with the patient and are negative.  I have reviewed the past medical history, past surgical history, social history and family history with the patient and they are unchanged from previous note.  ALLERGIES:  is allergic to percocet [oxycodone-acetaminophen]; acetaminophen; actos [pioglitazone hydrochloride]; latex; lisinopril; shrimp [shellfish allergy]; zocor [simvastatin - high dose]; and penicillins.  MEDICATIONS:  Current Outpatient Prescriptions  Medication Sig Dispense Refill  . anastrozole (ARIMIDEX) 1 MG tablet Take 1 tablet (1 mg total) by mouth daily. 90 tablet 3  . aspirin EC 81 MG tablet Take 81 mg by mouth daily.    Marland Kitchen atorvastatin (LIPITOR) 20 MG tablet TAKE 1 TABLET(20 MG) BY MOUTH DAILY 90 tablet 1  . Blood Glucose Monitoring Suppl (ONE TOUCH ULTRA SYSTEM KIT) w/Device KIT Use as directed. DX: E11.65. 1 each 0  . glimepiride (AMARYL) 4 MG tablet TAKE 1 TABLET BY MOUTH EVERY MORNING 90 tablet 2  . Glucosamine-Chondroitin (OSTEO BI-FLEX REGULAR STRENGTH PO) Take 1 tablet by mouth daily as needed (For joint health.). Reported on 08/17/2015    . latanoprost (XALATAN) 0.005 % ophthalmic solution Place 1 drop into both eyes at bedtime.  4  . losartan-hydrochlorothiazide (HYZAAR) 50-12.5 MG tablet TAKE 1 TABLET BY  MOUTH DAILY 90 tablet 1  . metFORMIN (GLUCOPHAGE) 500 MG tablet TAKE 2 TABLETS BY MOUTH EVERY MORNING AND 1 TABLET AT NIGHT 270 tablet 1  . ONE TOUCH LANCETS MISC Check Blood Sugars twice per day. DX: E11.65 100 each 5  . ONE TOUCH ULTRA TEST test strip USE TO  CHECK BLOOD SUGAR TWICE DAILY 100 each 1  . Tavaborole (KERYDIN) 5 % SOLN Apply 1 application topically at bedtime. Apply to toenails for fungal infection.     No current facility-administered medications for this visit.     PHYSICAL EXAMINATION: ECOG PERFORMANCE STATUS: 1 - Symptomatic but completely ambulatory  Vitals:   07/27/16 1240  BP: (!) 147/60  Pulse: 91  Resp: 18  Temp: 98.3 F (36.8 C)   Filed Weights   07/27/16 1240  Weight: 188 lb 9.6 oz (85.5 kg)    GENERAL:alert, no distress and comfortable SKIN: skin color, texture, turgor are normal, no rashes or significant lesions EYES: normal, Conjunctiva are pink and non-injected, sclera clear OROPHARYNX:no exudate, no erythema and lips, buccal mucosa, and tongue normal  NECK: supple, thyroid normal size, non-tender, without nodularity LYMPH:  no palpable lymphadenopathy in the cervical, axillary or inguinal LUNGS: clear to auscultation and percussion with normal breathing effort HEART: regular rate & rhythm and no murmurs and no lower extremity edema ABDOMEN:abdomen soft, non-tender and normal bowel sounds MUSCULOSKELETAL:no cyanosis of digits and no clubbing  NEURO: alert & oriented x 3 with fluent speech, no focal motor/sensory deficits EXTREMITIES: No lower extremity edema BREAST: No palpable masses or nodules in either right or left breasts. No palpable axillary supraclavicular or infraclavicular adenopathy no breast tenderness or nipple discharge. (exam performed in the presence of a chaperone)  LABORATORY DATA:  I have reviewed the data as listed   Chemistry      Component Value Date/Time   NA 140 05/18/2016 0802   NA 142 06/10/2015 1130   K 3.7 05/18/2016 0802   K 4.0 06/10/2015 1130   CL 104 05/18/2016 0802   CO2 29 05/18/2016 0802   CO2 25 06/10/2015 1130   BUN 10 05/18/2016 0802   BUN 15.4 06/10/2015 1130   CREATININE 0.70 05/18/2016 0802   CREATININE 0.9 06/10/2015 1130      Component Value  Date/Time   CALCIUM 9.3 05/18/2016 0802   CALCIUM 9.3 06/10/2015 1130   ALKPHOS 80 05/18/2016 0802   ALKPHOS 83 06/10/2015 1130   AST 13 05/18/2016 0802   AST 11 06/10/2015 1130   ALT 11 05/18/2016 0802   ALT 12 06/10/2015 1130   BILITOT 1.0 05/18/2016 0802   BILITOT 0.91 06/10/2015 1130       Lab Results  Component Value Date   WBC 5.0 07/31/2015   HGB 11.5 (L) 07/31/2015   HCT 35.6 (L) 07/31/2015   MCV 93.9 07/31/2015   PLT 178 07/31/2015   NEUTROABS 4.0 06/10/2015    ASSESSMENT & PLAN:  Breast cancer of upper-outer quadrant of right female breast ([place]) Right lumpectomy 07/09/2015: Invasive lobular cancer, grade 2, 1.9 cm, LCIS, ADH, lateral margin focally positive, 0/2 lymph nodes negative, ER 95%, PR 80%, Ki-67 15%, HER-2 negative, T1cN0 stage IA  Adjuvant radiation therapy 09/01/2015 to 09/28/2015  Treatment plan: Adjuvant antiestrogen therapy with anastrozole 1 mg by mouth daily 5-10 years Anastrozole Toxicities: 1. Occasional hot flashes 2. Myalgias/arthralgias   Patient will need a bone density test for evaluation for osteoporosis.  I will also order a breast mammogram for surveillance. Mild breast lymphedema:  I encouraged her to continue with the massages to help improve the drainage of the breasts.  RTC in 1 year for follow up.   I spent 15 minutes talking to the patient of which more than half was spent in counseling and coordination of care.  Orders Placed This Encounter  Procedures  . DG Bone Density    AETNA MEDICARE PF: OVER 2 YEARS// PT NOT SURE EXACTLY WHERE NO SPECIAL NEEDS [facility]    Standing Status:   Future    Standing Expiration Date:   07/27/2017    Order Specific Question:   Reason for Exam (SYMPTOM  OR DIAGNOSIS REQUIRED)    Answer:   post menopausal osteoporosis    Order Specific Question:   Preferred imaging location?    Answer:   [facility]  . MM DIAG BREAST TOMO BILATERAL    AETNA MEDICARE PF: 05/26/2015  BCG' TOMO NO IMPLANTS NO SPECIAL NEEDS [facility]    Standing Status:   Future    Standing Expiration Date:   09/26/2017    Order Specific Question:   Reason for Exam (SYMPTOM  OR DIAGNOSIS REQUIRED)    Answer:   Breast cancer evaluation    Order Specific Question:   Preferred imaging location?    Answer:   [facility]   The patient has a good understanding of the overall plan. she agrees with it. she will call with any problems that may develop before the next visit here.   Rulon Eisenmenger, MD 07/27/16

## 2016-08-01 ENCOUNTER — Other Ambulatory Visit: Payer: Self-pay | Admitting: Hematology and Oncology

## 2016-08-01 ENCOUNTER — Other Ambulatory Visit: Payer: Self-pay

## 2016-08-01 DIAGNOSIS — E2839 Other primary ovarian failure: Secondary | ICD-10-CM

## 2016-08-02 ENCOUNTER — Ambulatory Visit
Admission: RE | Admit: 2016-08-02 | Discharge: 2016-08-02 | Disposition: A | Discharge status: Home / Self Care | Payer: Medicare Other | Source: Ambulatory Visit | Attending: Hematology and Oncology | Admitting: Hematology and Oncology

## 2016-08-02 ENCOUNTER — Other Ambulatory Visit: Payer: Managed Care, Other (non HMO)

## 2016-08-02 ENCOUNTER — Other Ambulatory Visit: Payer: Self-pay | Admitting: Hematology and Oncology

## 2016-08-02 DIAGNOSIS — Z170 Estrogen receptor positive status [ER+]: Principal | ICD-10-CM

## 2016-08-02 DIAGNOSIS — R921 Mammographic calcification found on diagnostic imaging of breast: Secondary | ICD-10-CM

## 2016-08-02 DIAGNOSIS — E2839 Other primary ovarian failure: Secondary | ICD-10-CM

## 2016-08-02 DIAGNOSIS — C50411 Malignant neoplasm of upper-outer quadrant of right female breast: Secondary | ICD-10-CM

## 2016-08-02 HISTORY — DX: Personal history of irradiation: Z92.3

## 2016-08-02 HISTORY — DX: Malignant neoplasm of unspecified site of unspecified female breast: C50.919

## 2016-08-03 ENCOUNTER — Ambulatory Visit
Admission: RE | Admit: 2016-08-03 | Discharge: 2016-08-03 | Disposition: A | Discharge status: Home / Self Care | Payer: Medicare Other | Source: Ambulatory Visit | Attending: Hematology and Oncology | Admitting: Hematology and Oncology

## 2016-08-03 DIAGNOSIS — Z170 Estrogen receptor positive status [ER+]: Principal | ICD-10-CM

## 2016-08-03 DIAGNOSIS — C50411 Malignant neoplasm of upper-outer quadrant of right female breast: Secondary | ICD-10-CM

## 2016-08-03 DIAGNOSIS — R921 Mammographic calcification found on diagnostic imaging of breast: Secondary | ICD-10-CM

## 2016-08-05 ENCOUNTER — Other Ambulatory Visit: Payer: Self-pay | Admitting: Family Medicine

## 2016-10-06 ENCOUNTER — Other Ambulatory Visit: Payer: Self-pay | Admitting: Hematology and Oncology

## 2016-11-10 ENCOUNTER — Encounter: Payer: Self-pay | Admitting: Family Medicine

## 2016-11-18 ENCOUNTER — Encounter: Payer: Self-pay | Admitting: Family Medicine

## 2016-11-18 ENCOUNTER — Ambulatory Visit (INDEPENDENT_AMBULATORY_CARE_PROVIDER_SITE_OTHER): Payer: Managed Care, Other (non HMO) | Admitting: Family Medicine

## 2016-11-18 VITALS — BP 120/70 | HR 77 | Temp 98.4°F | Wt 188.7 lb

## 2016-11-18 DIAGNOSIS — R197 Diarrhea, unspecified: Secondary | ICD-10-CM | POA: Diagnosis not present

## 2016-11-18 DIAGNOSIS — E783 Hyperchylomicronemia: Secondary | ICD-10-CM | POA: Diagnosis not present

## 2016-11-18 DIAGNOSIS — I10 Essential (primary) hypertension: Secondary | ICD-10-CM | POA: Diagnosis not present

## 2016-11-18 DIAGNOSIS — E1165 Type 2 diabetes mellitus with hyperglycemia: Secondary | ICD-10-CM | POA: Diagnosis not present

## 2016-11-18 LAB — POCT GLYCOSYLATED HEMOGLOBIN (HGB A1C): Hemoglobin A1C: 7.3

## 2016-11-18 MED ORDER — LOSARTAN POTASSIUM-HCTZ 50-12.5 MG PO TABS: 1.0000 | ORAL_TABLET | Freq: Every day | ORAL | 3 refills | Status: DC

## 2016-11-18 MED ORDER — METFORMIN HCL ER 500 MG PO TB24: 500.0000 mg | ORAL_TABLET | Freq: Two times a day (BID) | ORAL | 5 refills | Status: DC

## 2016-11-18 MED ORDER — ATORVASTATIN CALCIUM 20 MG PO TABS: 20.0000 mg | ORAL_TABLET | Freq: Every day | ORAL | 3 refills | Status: DC

## 2016-11-18 NOTE — Progress Notes (Signed)
Subjective:     Patient ID: [name] [name], female   DOB: 1934-09-06, 82 y.o.   MRN: [id number]  HPI Patient seen for medical follow-up. Her chronic problems include obesity, type 2 diabetes, hypertension, history of kidney stones, history of breast cancer, glaucoma, hyperlipidemia.   Type 2 diabetes.  History of good control. Last A1c 7.1%. Not monitoring regularly. She takes metformin 500 mg 2 in the morning and one at night. She has had very frequent daily loose stools and sometimes even diarrhea. No bloody stools. No appetite or weight changes. Also takes glimepiride. No hypoglycemia. She is aware that metformin could be causing her stool changes. Has never been on extended release metformin  Hypertension. Treated with losartan HCTZ. No dizziness or headaches. No chest pains. No peripheral edema   Hyperlipidemia treated with atorvastatin. No myalgias.  Past Medical History:  Diagnosis Date  . Arthritis   . Breast cancer ([place])   . Breast cancer of upper-outer quadrant of right female breast ([place]) 06/04/2015  . Chronic kidney disease    stones  . Diabetes mellitus   . Hyperlipidemia   . Hypertension   . Personal history of radiation therapy   . PONV (postoperative nausea and vomiting)   . UTI (urinary tract infection)    Past Surgical History:  Procedure Laterality Date  . ABDOMINAL HYSTERECTOMY  1980  . BREAST LUMPECTOMY     right breast 2017  . CATARACT EXTRACTION    . CHOLECYSTECTOMY  1989  . COLONOSCOPY    . RADIOACTIVE SEED GUIDED MASTECTOMY WITH AXILLARY SENTINEL LYMPH NODE BIOPSY Right 07/09/2015   Procedure: RADIOACTIVE SEED GUIDED PARTIAL MASTECTOMY WITH AXILLARY SENTINEL LYMPH NODE BIOPSY;  Surgeon: Erroll Luna, MD;  Location: [facility];  Service: General;  Laterality: Right;  . RE-EXCISION OF BREAST LUMPECTOMY Right 07/31/2015   Procedure: RE-EXCISION OF RIGHT BREAST LUMPECTOMY;  Surgeon: Erroll Luna, MD;  Location: [facility];  Service: General;   Laterality: Right;  . TONSILLECTOMY  1953    reports that she has never smoked. She has never used smokeless tobacco. She reports that she does not drink alcohol or use drugs. family history includes Cancer in her mother; Diabetes in her brother and sister; Heart disease (age of onset: 53) in her father. Allergies  Allergen Reactions  . Percocet [Oxycodone-Acetaminophen] Nausea And Vomiting  . Acetaminophen Nausea And Vomiting and Other (See Comments)    Allergic to Tylenol #3  . Actos [Pioglitazone Hydrochloride] Hives  . Latex Swelling and Other (See Comments)    Lip swelling  . Lisinopril Cough  . Shrimp [Shellfish Allergy] Itching  . Zocor [Simvastatin - High Dose] Itching  . Penicillins Rash and Other (See Comments)    Has patient had a PCN reaction causing immediate rash, facial/tongue/throat swelling, SOB or lightheadedness with hypotension: yes Has patient had a PCN reaction causing severe rash involving mucus membranes or skin necrosis: no Has patient had a PCN reaction that required hospitalization no Has patient had a PCN reaction occurring within the last 10 years: no If all of the above answers are "NO", then may proceed with Cephalosporin use.        Review of Systems  Constitutional: Negative for fatigue.  Eyes: Negative for visual disturbance.  Respiratory: Negative for cough, chest tightness, shortness of breath and wheezing.   Cardiovascular: Negative for chest pain, palpitations and leg swelling.  Gastrointestinal: Positive for diarrhea. Negative for blood in stool, nausea and vomiting.  Neurological: Negative for dizziness, seizures, syncope,  weakness, light-headedness and headaches.       Objective:   Physical Exam  Constitutional: She is oriented to person, place, and time. She appears well-developed and well-nourished.  Cardiovascular: Normal rate and regular rhythm.   Pulmonary/Chest: Effort normal and breath sounds normal. No respiratory distress.  She has no wheezes. She has no rales.  Musculoskeletal: She exhibits no edema.  Neurological: She is alert and oriented to person, place, and time. No cranial nerve deficit.  Psychiatric: She has a normal mood and affect. Her behavior is normal.       Assessment:     #1 type 2 diabetes. A1c today 7.3%  #2 loose stools probably secondary to metformin  #3 hypertension stable and at goal  #4 hyperlipidemia    Plan:     -flu vaccine recommended and patient declines -Change immediate release metformin to metformin extended release 500 mg twice a day -Office follow-up in 4 months to reassess -Touch base if loose stools not improving with change of metformin  Eulas Post MD [facility]

## 2017-01-06 ENCOUNTER — Other Ambulatory Visit: Payer: Self-pay | Admitting: Family Medicine

## 2017-01-07 ENCOUNTER — Other Ambulatory Visit: Payer: Self-pay | Admitting: Hematology and Oncology

## 2017-01-09 ENCOUNTER — Other Ambulatory Visit: Payer: Self-pay

## 2017-01-09 MED ORDER — ANASTROZOLE 1 MG PO TABS: ORAL_TABLET | ORAL | 0 refills | Status: DC

## 2017-01-14 ENCOUNTER — Other Ambulatory Visit: Payer: Self-pay | Admitting: Family Medicine

## 2017-02-07 ENCOUNTER — Other Ambulatory Visit: Payer: Self-pay | Admitting: Family Medicine

## 2017-03-17 ENCOUNTER — Ambulatory Visit (INDEPENDENT_AMBULATORY_CARE_PROVIDER_SITE_OTHER): Payer: 59 | Admitting: Family Medicine

## 2017-03-17 ENCOUNTER — Encounter: Payer: Self-pay | Admitting: Family Medicine

## 2017-03-17 VITALS — BP 150/78 | HR 84 | Temp 98.6°F | Ht 61.0 in | Wt 193.0 lb

## 2017-03-17 DIAGNOSIS — E118 Type 2 diabetes mellitus with unspecified complications: Secondary | ICD-10-CM | POA: Diagnosis not present

## 2017-03-17 DIAGNOSIS — I10 Essential (primary) hypertension: Secondary | ICD-10-CM

## 2017-03-17 DIAGNOSIS — E1165 Type 2 diabetes mellitus with hyperglycemia: Secondary | ICD-10-CM | POA: Diagnosis not present

## 2017-03-17 DIAGNOSIS — E783 Hyperchylomicronemia: Secondary | ICD-10-CM

## 2017-03-17 LAB — POCT GLYCOSYLATED HEMOGLOBIN (HGB A1C): Hemoglobin A1C: 7.7

## 2017-03-17 NOTE — Patient Instructions (Signed)
Scale back sugar and white starch intake.

## 2017-03-17 NOTE — Progress Notes (Signed)
Subjective:     Patient ID: [name] [name], female   DOB: 04/30/1934, 83 y.o.   MRN: [id number]  HPI Patient is seen for follow-up type 2 diabetes as well as hypertension hyperlipidemia. She is unfortunately not fasting today. She takes atorvastatin for hyperlipidemia. No myalgias.  Blood pressures been well controlled by home readings. She remains on losartan HCTZ. Does have elevated reading today but she states she has been at Hospital past few days with her sister and also a quite a bit of sodium this morning. No recent headaches or peripheral edema  Type 2 diabetes. Recent A1c 7.3%. She had frequent loose stools and we changed her over to extend release metformin and this does seem to help. Her loose stools have basically resolved. Recent poor compliance with diet  Past Medical History:  Diagnosis Date  . Arthritis   . Breast cancer ([place])   . Breast cancer of upper-outer quadrant of right female breast ([place]) 06/04/2015  . Chronic kidney disease    stones  . Diabetes mellitus   . Hyperlipidemia   . Hypertension   . Personal history of radiation therapy   . PONV (postoperative nausea and vomiting)   . UTI (urinary tract infection)    Past Surgical History:  Procedure Laterality Date  . ABDOMINAL HYSTERECTOMY  1980  . BREAST LUMPECTOMY     right breast 2017  . CATARACT EXTRACTION    . CHOLECYSTECTOMY  1989  . COLONOSCOPY    . RADIOACTIVE SEED GUIDED PARTIAL MASTECTOMY WITH AXILLARY SENTINEL LYMPH NODE BIOPSY Right 07/09/2015   Procedure: RADIOACTIVE SEED GUIDED PARTIAL MASTECTOMY WITH AXILLARY SENTINEL LYMPH NODE BIOPSY;  Surgeon: Erroll Luna, MD;  Location: [facility];  Service: General;  Laterality: Right;  . RE-EXCISION OF BREAST LUMPECTOMY Right 07/31/2015   Procedure: RE-EXCISION OF RIGHT BREAST LUMPECTOMY;  Surgeon: Erroll Luna, MD;  Location: [facility];  Service: General;  Laterality: Right;  . TONSILLECTOMY  1953    reports that  has never smoked. she has  never used smokeless tobacco. She reports that she does not drink alcohol or use drugs. family history includes Cancer in her mother; Diabetes in her brother and sister; Heart disease (age of onset: 81) in her father. Allergies  Allergen Reactions  . Percocet [Oxycodone-Acetaminophen] Nausea And Vomiting  . Acetaminophen Nausea And Vomiting and Other (See Comments)    Allergic to Tylenol #3  . Actos [Pioglitazone Hydrochloride] Hives  . Latex Swelling and Other (See Comments)    Lip swelling  . Lisinopril Cough  . Shrimp [Shellfish Allergy] Itching  . Zocor [Simvastatin - High Dose] Itching  . Penicillins Rash and Other (See Comments)    Has patient had a PCN reaction causing immediate rash, facial/tongue/throat swelling, SOB or lightheadedness with hypotension: yes Has patient had a PCN reaction causing severe rash involving mucus membranes or skin necrosis: no Has patient had a PCN reaction that required hospitalization no Has patient had a PCN reaction occurring within the last 10 years: no If all of the above answers are "NO", then may proceed with Cephalosporin use.      Review of Systems  Constitutional: Negative for fatigue.  Eyes: Negative for visual disturbance.  Respiratory: Negative for cough, chest tightness, shortness of breath and wheezing.   Cardiovascular: Negative for chest pain, palpitations and leg swelling.  Gastrointestinal: Negative for abdominal pain.  Endocrine: Negative for polydipsia and polyuria.  Genitourinary: Negative for dysuria.  Neurological: Negative for dizziness, seizures, syncope, weakness, light-headedness and  headaches.       Objective:   Physical Exam  Constitutional: She appears well-developed and well-nourished.  Eyes: Pupils are equal, round, and reactive to light.  Neck: Neck supple. No JVD present. No thyromegaly present.  Cardiovascular: Normal rate and regular rhythm. Exam reveals no gallop.  Pulmonary/Chest: Effort normal and  breath sounds normal. No respiratory distress. She has no wheezes. She has no rales.  Musculoskeletal: She exhibits no edema.  Neurological: She is alert.  Skin:  Feet are dry but no lesions. She has good sensory function throughout. She has thickened nails with probable onychomycosis       Assessment:     #1 hypertension-elevated reading today but she ate a lot of sodium past few days which may be accounting for this  #2 type 2 diabetes A1c today 7.7%. Recent poor compliance with diet  #3 hyperlipidemia    Plan:     -Step up dietary compliance. She has been eating a lot of high sugar sources such as white bread, molasses, fruit juices -Continue current medications and reassess blood pressure in 2 months -Check lipid panel at follow-up  Eulas Post MD  Primary Care at [facility]

## 2017-04-15 ENCOUNTER — Other Ambulatory Visit: Payer: Self-pay | Admitting: Hematology and Oncology

## 2017-04-30 ENCOUNTER — Other Ambulatory Visit: Payer: Self-pay | Admitting: Family Medicine

## 2017-05-15 ENCOUNTER — Ambulatory Visit (INDEPENDENT_AMBULATORY_CARE_PROVIDER_SITE_OTHER): Payer: 59 | Admitting: Family Medicine

## 2017-05-15 ENCOUNTER — Encounter: Payer: Self-pay | Admitting: Family Medicine

## 2017-05-15 VITALS — BP 120/78 | HR 87 | Temp 98.2°F | Wt 187.2 lb

## 2017-05-15 DIAGNOSIS — E783 Hyperchylomicronemia: Secondary | ICD-10-CM | POA: Diagnosis not present

## 2017-05-15 DIAGNOSIS — I10 Essential (primary) hypertension: Secondary | ICD-10-CM | POA: Diagnosis not present

## 2017-05-15 DIAGNOSIS — E118 Type 2 diabetes mellitus with unspecified complications: Secondary | ICD-10-CM

## 2017-05-15 LAB — LIPID PANEL
CHOLESTEROL: 101 mg/dL (ref 0–200)
HDL: 61.5 mg/dL (ref >39.00)
LDL Cholesterol: 26 mg/dL (ref 0–99)
NONHDL: 39.75
Total CHOL/HDL Ratio: 2
Triglycerides: 70 mg/dL (ref 0.0–149.0)
VLDL: 14 mg/dL (ref 0.0–40.0)

## 2017-05-15 LAB — HEPATIC FUNCTION PANEL
ALK PHOS: 91 U/L (ref 39–117)
ALT: 13 U/L (ref 0–35)
AST: 13 U/L (ref 0–37)
Albumin: 3.7 g/dL (ref 3.5–5.2)
BILIRUBIN DIRECT: 0.3 mg/dL (ref 0.0–0.3)
BILIRUBIN TOTAL: 1.2 mg/dL (ref 0.2–1.2)
Total Protein: 6.6 g/dL (ref 6.0–8.3)

## 2017-05-15 LAB — BASIC METABOLIC PANEL
BUN: 8 mg/dL (ref 6–23)
CALCIUM: 9.1 mg/dL (ref 8.4–10.5)
CO2: 29 mEq/L (ref 19–32)
Chloride: 102 mEq/L (ref 96–112)
Creatinine, Ser: 0.62 mg/dL (ref 0.40–1.20)
GFR: 118.75 mL/min (ref >60.00)
GLUCOSE: 203 mg/dL — AB (ref 70–99)
Potassium: 3.5 mEq/L (ref 3.5–5.1)
Sodium: 138 mEq/L (ref 135–145)

## 2017-05-15 MED ORDER — GLUCOSE BLOOD VI STRP: ORAL_STRIP | 3 refills | Status: DC

## 2017-05-15 NOTE — Progress Notes (Signed)
Subjective:     Patient ID: [name] [name], female   DOB: [birth year], 83 y.o.   MRN: [id number]  HPI Patient for medical follow-up. She has history of obesity, type 2 diabetes, hypertension, hyperlipidemia, glaucoma, history of breast cancer, history of kidney stones  Type 2 diabetes. Was having some loose stools with regular metformin and we changed to extended release and doing much better.  Recent A1c through oncology 7.7%. Patient requesting test strip refills  She has hypertension on losartan HCTZ and well controlled. She has hyperlipidemia treated with atorvastatin. She is due for follow-up labs. No recent chest pains. No recent falls.  Past Medical History:  Diagnosis Date  . Arthritis   . Breast cancer ([place])   . Breast cancer of upper-outer quadrant of right female breast ([place]) 06/04/2015  . Chronic kidney disease    stones  . Diabetes mellitus   . Hyperlipidemia   . Hypertension   . Personal history of radiation therapy   . PONV (postoperative nausea and vomiting)   . UTI (urinary tract infection)    Past Surgical History:  Procedure Laterality Date  . ABDOMINAL HYSTERECTOMY  1980  . BREAST LUMPECTOMY     right breast 2017  . CATARACT EXTRACTION    . CHOLECYSTECTOMY  1989  . COLONOSCOPY    . RADIOACTIVE SEED GUIDED PARTIAL MASTECTOMY WITH AXILLARY SENTINEL LYMPH NODE BIOPSY Right 07/09/2015   Procedure: RADIOACTIVE SEED GUIDED PARTIAL MASTECTOMY WITH AXILLARY SENTINEL LYMPH NODE BIOPSY;  Surgeon: Erroll Luna, MD;  Location: [facility];  Service: General;  Laterality: Right;  . RE-EXCISION OF BREAST LUMPECTOMY Right 07/31/2015   Procedure: RE-EXCISION OF RIGHT BREAST LUMPECTOMY;  Surgeon: Erroll Luna, MD;  Location: [facility];  Service: General;  Laterality: Right;  . TONSILLECTOMY  1953    reports that she has never smoked. She has never used smokeless tobacco. She reports that she does not drink alcohol or use drugs. family history includes Cancer in her  mother; Diabetes in her brother and sister; Heart disease (age of onset: 33) in her father. Allergies  Allergen Reactions  . Percocet [Oxycodone-Acetaminophen] Nausea And Vomiting  . Acetaminophen Nausea And Vomiting and Other (See Comments)    Allergic to Tylenol #3  . Actos [Pioglitazone Hydrochloride] Hives  . Latex Swelling and Other (See Comments)    Lip swelling  . Lisinopril Cough  . Shrimp [Shellfish Allergy] Itching  . Zocor [Simvastatin - High Dose] Itching  . Penicillins Rash and Other (See Comments)    Has patient had a PCN reaction causing immediate rash, facial/tongue/throat swelling, SOB or lightheadedness with hypotension: yes Has patient had a PCN reaction causing severe rash involving mucus membranes or skin necrosis: no Has patient had a PCN reaction that required hospitalization no Has patient had a PCN reaction occurring within the last 10 years: no If all of the above answers are "NO", then may proceed with Cephalosporin use.      Review of Systems  Constitutional: Negative for fatigue and unexpected weight change.  Eyes: Negative for visual disturbance.  Respiratory: Negative for cough, chest tightness, shortness of breath and wheezing.   Cardiovascular: Negative for chest pain, palpitations and leg swelling.  Endocrine: Negative for polydipsia and polyuria.  Genitourinary: Negative for dysuria.  Neurological: Negative for dizziness, seizures, syncope, weakness, light-headedness and headaches.       Objective:   Physical Exam  Constitutional: She appears well-developed and well-nourished.  Neck: Neck supple.  Cardiovascular: Normal rate and regular rhythm.  Pulmonary/Chest: Effort normal and breath sounds normal. No respiratory distress. She has no wheezes. She has no rales.  Musculoskeletal: She exhibits no edema.  Psychiatric: She has a normal mood and affect. Her behavior is normal.       Assessment:     #1 hypertension stable and at goal  #2  dyslipidemia. Goal LDL less than 100  #3 type 2 diabetes. Recent A1c 7.7%.    Plan:     -Check further labs today with lipid, hepatic, basic metabolic panel -Refill glucose test strips for one year -Would like to see A1c slightly improved. She'll try to tighten up diet. Prefer not to add more medication at this time. -Routine follow-up in 3 months and reassess A1c at that time  Eulas Post MD [facility] at [facility]

## 2017-06-19 ENCOUNTER — Telehealth: Payer: Self-pay | Admitting: Family Medicine

## 2017-06-19 MED ORDER — METFORMIN HCL ER 500 MG PO TB24: ORAL_TABLET | ORAL | 0 refills | Status: DC

## 2017-06-19 NOTE — Telephone Encounter (Signed)
Patient is requesting a call back to discuss her Metformin.  [phone]

## 2017-06-19 NOTE — Telephone Encounter (Signed)
Spoke with patient and she states that [facility], [place], [facility] do not carry her prescription for metformin.  I spoke with the pharmacist at [facility] and they have the medication.  New Rx sent.  Left detailed message on machine for patient.

## 2017-06-20 ENCOUNTER — Other Ambulatory Visit: Payer: Self-pay | Admitting: Family Medicine

## 2017-06-21 MED ORDER — METFORMIN HCL ER 500 MG PO TB24: ORAL_TABLET | ORAL | 1 refills | Status: DC

## 2017-06-21 MED FILL — METFORMIN HCL ER 500 MG TAB: 500 | 50 days supply | Qty: 100 | Fill #0

## 2017-07-06 ENCOUNTER — Other Ambulatory Visit: Payer: Self-pay | Admitting: Hematology and Oncology

## 2017-07-06 DIAGNOSIS — Z853 Personal history of malignant neoplasm of breast: Secondary | ICD-10-CM

## 2017-07-28 ENCOUNTER — Telehealth: Payer: Self-pay | Admitting: Hematology and Oncology

## 2017-07-28 ENCOUNTER — Inpatient Hospital Stay: Payer: Medicare Other | Attending: Hematology and Oncology | Admitting: Hematology and Oncology

## 2017-07-28 DIAGNOSIS — Z170 Estrogen receptor positive status [ER+]: Secondary | ICD-10-CM | POA: Diagnosis not present

## 2017-07-28 DIAGNOSIS — Z923 Personal history of irradiation: Secondary | ICD-10-CM | POA: Diagnosis not present

## 2017-07-28 DIAGNOSIS — C50411 Malignant neoplasm of upper-outer quadrant of right female breast: Secondary | ICD-10-CM | POA: Diagnosis not present

## 2017-07-28 DIAGNOSIS — Z79811 Long term (current) use of aromatase inhibitors: Secondary | ICD-10-CM | POA: Diagnosis not present

## 2017-07-28 MED ORDER — ANASTROZOLE 1 MG PO TABS: 1.0000 mg | ORAL_TABLET | Freq: Every day | ORAL | 3 refills | Status: DC

## 2017-07-28 NOTE — Telephone Encounter (Signed)
Gave avs and calendar ° °

## 2017-07-28 NOTE — Assessment & Plan Note (Signed)
Right lumpectomy 07/09/2015: Invasive lobular cancer, grade 2, 1.9 cm, LCIS, ADH, lateral margin focally positive, 0/2 lymph nodes negative, ER 95%, PR 80%, Ki-67 15%, HER-2 negative, T1cN0 stage IA  Adjuvant radiation therapy 09/01/2015 to 09/28/2015  Treatment plan: Adjuvant antiestrogen therapy with anastrozole 1 mg by mouth daily 5-10 years  Anastrozole Toxicities: 1. Occasional hot flashes 2. Myalgias/arthralgias   Breast cancer surveillance:  1.Breast exam 07/28/2017: Benign 2. Mammogram scheduled for 08/03/2017 3. Bone density 08/02/2016: T score -0.9: Normal   Mild breast lymphedema: I encouraged her to continue with the massages to help improve the drainage of the breasts.  RTC in 1 year for follow up.

## 2017-07-28 NOTE — Progress Notes (Signed)
Patient Care Team: Eulas Post, MD as PCP - General (Family Medicine) Sylvan Cheese, NP as Nurse Practitioner (Hematology and Oncology)  DIAGNOSIS:  Encounter Diagnosis  Name Primary?  . Malignant neoplasm of upper-outer quadrant of right breast in female, estrogen receptor positive ([place])     SUMMARY OF ONCOLOGIC HISTORY:   Breast cancer of upper-outer quadrant of right female breast ([place])   06/02/2015 Initial Diagnosis    Right breast biopsy 10:00 position: Invasive lobular cancer with LCIS, ER 95%, PR 80%, Ki-67 15%, HER-2 negative duration 1.61, ultrasound revealed a 1.9 x 1 x 1.4 cm lesion, T1 cN0 stage IA clinical stage      07/09/2015 Surgery    Right lumpectomy: Invasive lobular cancer, grade 2, 1.9 cm, LCIS, ADH, lateral margin focally positive, 0/2 lymph nodes negative, ER 95%, PR 80%, Ki-67 15%, HER-2 negative, T1cN0 stage IA      07/31/2015 Surgery    Reresection surgical margins: Clear, Benign      09/01/2015 - 09/28/2015 Radiation Therapy    Adjuvant radiation therapy      10/22/2015 -  Anti-estrogen oral therapy    Anastrozole 1 mg daily       CHIEF COMPLIANT: Follow-up on anastrozole therapy  INTERVAL HISTORY: [name] [name] is a 83 year old with above-mentioned history of right breast cancer treated with lumpectomy radiation and is currently on anastrozole therapy for the past 2 years.  She appears to be tolerating anastrozole fairly well.  She has occasional hot flashes and occasional muscle aches and pains.  She denies any lumps or nodules in the breast.  REVIEW OF SYSTEMS:   Constitutional: Denies fevers, chills or abnormal weight loss Eyes: Denies blurriness of vision Ears, nose, mouth, throat, and face: Denies mucositis or sore throat Respiratory: Denies cough, dyspnea or wheezes Cardiovascular: Denies palpitation, chest discomfort Gastrointestinal:  Denies nausea, heartburn or change in bowel habits Skin: Denies abnormal skin  rashes Lymphatics: Denies new lymphadenopathy or easy bruising Neurological:Denies numbness, tingling or new weaknesses Behavioral/Psych: Mood is stable, no new changes  Extremities: No lower extremity edema Breast:  denies any pain or lumps or nodules in either breasts All other systems were reviewed with the patient and are negative.  I have reviewed the past medical history, past surgical history, social history and family history with the patient and they are unchanged from previous note.  ALLERGIES:  is allergic to percocet [oxycodone-acetaminophen]; acetaminophen; actos [pioglitazone hydrochloride]; latex; lisinopril; shrimp [shellfish allergy]; zocor [simvastatin - high dose]; and penicillins.  MEDICATIONS:  Current Outpatient Medications  Medication Sig Dispense Refill  . anastrozole (ARIMIDEX) 1 MG tablet Take 1 tablet (1 mg total) by mouth daily. 90 tablet 3  . aspirin EC 81 MG tablet Take 81 mg by mouth daily.    Marland Kitchen atorvastatin (LIPITOR) 20 MG tablet Take 1 tablet (20 mg total) by mouth daily at 6 PM. 90 tablet 3  . Blood Glucose Monitoring Suppl (ONE TOUCH ULTRA SYSTEM KIT) w/Device KIT Use as directed. DX: E11.65. 1 each 0  . glimepiride (AMARYL) 4 MG tablet TAKE 1 TABLET BY MOUTH EVERY MORNING 90 tablet 1  . Glucosamine-Chondroitin (OSTEO BI-FLEX REGULAR STRENGTH PO) Take 1 tablet by mouth daily as needed (For joint health.). Reported on 08/17/2015    . glucose blood (ONE TOUCH ULTRA TEST) test strip USE TO CHECK BLOOD SUGAR TWICE DAILY 100 each 3  . latanoprost (XALATAN) 0.005 % ophthalmic solution Place 1 drop into both eyes at bedtime.  4  .  losartan-hydrochlorothiazide (HYZAAR) 50-12.5 MG tablet Take 1 tablet by mouth daily. 90 tablet 3  . metFORMIN (GLUCOPHAGE-XR) 500 MG 24 hr tablet TAKE 1 TABLET(500 MG) BY MOUTH TWICE DAILY 180 tablet 1  . ONE TOUCH LANCETS MISC Check Blood Sugars twice per day. DX: E11.65 100 each 5  . Tavaborole (KERYDIN) 5 % SOLN Apply 1 application  topically at bedtime. Apply to toenails for fungal infection.     No current facility-administered medications for this visit.     PHYSICAL EXAMINATION: ECOG PERFORMANCE STATUS: 1 - Symptomatic but completely ambulatory  Vitals:   07/28/17 1031  BP: (!) 148/79  Pulse: 92  Resp: 19  Temp: 98.3 F (36.8 C)  SpO2: 96%   Filed Weights   07/28/17 1031  Weight: 190 lb 6.4 oz (86.4 kg)    GENERAL:alert, no distress and comfortable SKIN: skin color, texture, turgor are normal, no rashes or significant lesions EYES: normal, Conjunctiva are pink and non-injected, sclera clear OROPHARYNX:no exudate, no erythema and lips, buccal mucosa, and tongue normal  NECK: supple, thyroid normal size, non-tender, without nodularity LYMPH:  no palpable lymphadenopathy in the cervical, axillary or inguinal LUNGS: clear to auscultation and percussion with normal breathing effort HEART: regular rate & rhythm and no murmurs and no lower extremity edema ABDOMEN:abdomen soft, non-tender and normal bowel sounds MUSCULOSKELETAL:no cyanosis of digits and no clubbing  NEURO: alert & oriented x 3 with fluent speech, no focal motor/sensory deficits EXTREMITIES: No lower extremity edema BREAST: Scar tissue from prior breast surgery in the right breast and axilla. no palpable axillary supraclavicular or infraclavicular adenopathy no breast tenderness or nipple discharge. (exam performed in the presence of a chaperone)  LABORATORY DATA:  I have reviewed the data as listed CMP Latest Ref Rng & Units 05/15/2017 05/18/2016 07/31/2015  Glucose 70 - 99 mg/dL 203(H) 117(H) 116(H)  BUN 6 - 23 mg/dL _0 Creatinine 0.40 - 1.20 mg/dL 0.62 0.70 0.70  Sodium 135 - 145 mEq/L 138 140 140  Potassium 3.5 - 5.1 mEq/L 3.5 3.7 3.4(L)  Chloride 96 - 112 mEq/L 102 104 107  CO2 19 - 32 mEq/L _1 Calcium 8.4 - 10.5 mg/dL 9.1 9.3 9.2  Total Protein 6.0 - 8.3 g/dL 6.6 6.4 -  Total Bilirubin 0.2 - 1.2 mg/dL 1.2 1.0 -  Alkaline  Phos 39 - 117 U/L 91 80 -  AST 0 - 37 U/L 13 13 -  ALT 0 - 35 U/L 13 11 -    Lab Results  Component Value Date   WBC 5.0 07/31/2015   HGB 11.5 (L) 07/31/2015   HCT 35.6 (L) 07/31/2015   MCV 93.9 07/31/2015   PLT 178 07/31/2015   NEUTROABS 4.0 06/10/2015    ASSESSMENT & PLAN:  Breast cancer of upper-outer quadrant of right female breast ([place]) Right lumpectomy 07/09/2015: Invasive lobular cancer, grade 2, 1.9 cm, LCIS, ADH, lateral margin focally positive, 0/2 lymph nodes negative, ER 95%, PR 80%, Ki-67 15%, HER-2 negative, T1cN0 stage IA  Adjuvant radiation therapy 09/01/2015 to 09/28/2015  Treatment plan: Adjuvant antiestrogen therapy with anastrozole 1 mg by mouth daily 5-10 years  Anastrozole Toxicities: 1. Occasional hot flashes 2. Myalgias/arthralgias   Breast cancer surveillance:  1.Breast exam 07/28/2017: Benign 2. Mammogram scheduled for 08/03/2017 3. Bone density 08/02/2016: T score -0.9: Normal   Mild breast lymphedema: I encouraged her to continue with the massages to help improve the drainage of the breasts.  RTC in 1 year for follow  up.      No orders of the defined types were placed in this encounter.  The patient has a good understanding of the overall plan. she agrees with it. she will call with any problems that may develop before the next visit here.   Harriette Ohara, MD 07/28/17

## 2017-08-01 ENCOUNTER — Other Ambulatory Visit: Payer: Self-pay | Admitting: Family Medicine

## 2017-08-03 ENCOUNTER — Ambulatory Visit
Admission: RE | Admit: 2017-08-03 | Discharge: 2017-08-03 | Disposition: A | Discharge status: Home / Self Care | Payer: 59 | Source: Ambulatory Visit | Attending: Hematology and Oncology | Admitting: Hematology and Oncology

## 2017-08-03 DIAGNOSIS — Z853 Personal history of malignant neoplasm of breast: Secondary | ICD-10-CM

## 2017-08-03 MED FILL — METFORMIN HCL ER 500 MG TAB: 500 | 50 days supply | Qty: 100 | Fill #0

## 2017-08-14 ENCOUNTER — Other Ambulatory Visit: Payer: Self-pay | Admitting: Hematology and Oncology

## 2017-08-15 ENCOUNTER — Ambulatory Visit (INDEPENDENT_AMBULATORY_CARE_PROVIDER_SITE_OTHER): Payer: 59 | Admitting: Family Medicine

## 2017-08-15 ENCOUNTER — Encounter: Payer: Self-pay | Admitting: Family Medicine

## 2017-08-15 VITALS — BP 130/80 | HR 79 | Temp 98.0°F | Wt 186.4 lb

## 2017-08-15 DIAGNOSIS — H409 Unspecified glaucoma: Secondary | ICD-10-CM

## 2017-08-15 DIAGNOSIS — I10 Essential (primary) hypertension: Secondary | ICD-10-CM | POA: Diagnosis not present

## 2017-08-15 DIAGNOSIS — E783 Hyperchylomicronemia: Secondary | ICD-10-CM | POA: Diagnosis not present

## 2017-08-15 DIAGNOSIS — E118 Type 2 diabetes mellitus with unspecified complications: Secondary | ICD-10-CM | POA: Diagnosis not present

## 2017-08-15 LAB — POCT GLYCOSYLATED HEMOGLOBIN (HGB A1C): HEMOGLOBIN A1C: 7.8 % — AB (ref 4.0–5.6)

## 2017-08-15 NOTE — Progress Notes (Signed)
Subjective:     Patient ID: [name] [name], female   DOB: 29-Jul-1934, 83 y.o.   MRN: [id number]  HPI Seen for medical follow-up. Type 2 diabetes. She had frequent loose stools with immediate release metformin. We changed her over to extended release metformin she's done well with that. She has lost couple pounds since last visit. She's been exercising regularly with exercise cycle. No chest pains. No dyspnea. No recent hypoglycemic symptoms.  She has history of glaucoma and is getting regular follow-ups through ophthalmology for that  Hyperlipidemia treated with Lipitor. No myalgias. No history of known CAD.  Hypertension treated with losartan HCTZ. No recent dizziness. Compliant with all medications  Past Medical History:  Diagnosis Date  . Arthritis   . Breast cancer ([place])   . Breast cancer of upper-outer quadrant of right female breast ([place]) 06/04/2015  . Chronic kidney disease    stones  . Diabetes mellitus   . Hyperlipidemia   . Hypertension   . Personal history of radiation therapy   . PONV (postoperative nausea and vomiting)   . UTI (urinary tract infection)    Past Surgical History:  Procedure Laterality Date  . ABDOMINAL HYSTERECTOMY  1980  . BREAST LUMPECTOMY     right breast 2017  . CATARACT EXTRACTION    . CHOLECYSTECTOMY  1989  . COLONOSCOPY    . RADIOACTIVE SEED GUIDED PARTIAL MASTECTOMY WITH AXILLARY SENTINEL LYMPH NODE BIOPSY Right 07/09/2015   Procedure: RADIOACTIVE SEED GUIDED PARTIAL MASTECTOMY WITH AXILLARY SENTINEL LYMPH NODE BIOPSY;  Surgeon: Erroll Luna, MD;  Location: [place];  Service: General;  Laterality: Right;  . RE-EXCISION OF BREAST LUMPECTOMY Right 07/31/2015   Procedure: RE-EXCISION OF RIGHT BREAST LUMPECTOMY;  Surgeon: Erroll Luna, MD;  Location: [place];  Service: General;  Laterality: Right;  . TONSILLECTOMY  1953    reports that she has never smoked. She has never used smokeless tobacco. She reports that she does not  drink alcohol or use drugs. family history includes Cancer in her mother; Diabetes in her brother and sister; Heart disease (age of onset: 41) in her father. Allergies  Allergen Reactions  . Percocet [Oxycodone-Acetaminophen] Nausea And Vomiting  . Acetaminophen Nausea And Vomiting and Other (See Comments)    Allergic to Tylenol #3  . Actos [Pioglitazone Hydrochloride] Hives  . Latex Swelling and Other (See Comments)    Lip swelling  . Lisinopril Cough  . Shrimp [Shellfish Allergy] Itching  . Zocor [Simvastatin - High Dose] Itching  . Penicillins Rash and Other (See Comments)    Has patient had a PCN reaction causing immediate rash, facial/tongue/throat swelling, SOB or lightheadedness with hypotension: yes Has patient had a PCN reaction causing severe rash involving mucus membranes or skin necrosis: no Has patient had a PCN reaction that required hospitalization no Has patient had a PCN reaction occurring within the last 10 years: no If all of the above answers are "NO", then may proceed with Cephalosporin use.      Review of Systems  Constitutional: Negative for fatigue and unexpected weight change.  Eyes: Negative for visual disturbance.  Respiratory: Negative for cough, chest tightness, shortness of breath and wheezing.   Cardiovascular: Negative for chest pain, palpitations and leg swelling.  Gastrointestinal: Negative for abdominal pain.  Endocrine: Negative for polydipsia and polyuria.  Genitourinary: Negative for dysuria.  Neurological: Negative for dizziness, seizures, syncope, weakness, light-headedness and headaches.       Objective:   Physical Exam  Constitutional: She appears  well-developed and well-nourished.  Eyes: Pupils are equal, round, and reactive to light.  Neck: Neck supple. No JVD present. No thyromegaly present.  Cardiovascular: Normal rate and regular rhythm. Exam reveals no gallop.  Pulmonary/Chest: Effort normal and breath sounds normal. No  respiratory distress. She has no wheezes. She has no rales.  Musculoskeletal: She exhibits no edema.  Neurological: She is alert.       Assessment:     #1 type 2 diabetes. Stable with A1c 7.8%  #2 hypertension stable and at goal  #3 dyslipidemia treated with Lipitor    Plan:     -She is encouraged to lose more weight and continue with regular exercise habits -Plan three-month follow-up. If A1c increasing then consider additional medication  Eulas Post MD  Primary Care at [facility]

## 2017-08-15 NOTE — Patient Instructions (Signed)
Your A1C was stable at 7.8%  Try to keep sugar and starch intake down.  Let's plan on 3 month follow up.

## 2017-09-22 MED FILL — METFORMIN HCL ER 500 MG TAB: 500 | 50 days supply | Qty: 100 | Fill #1

## 2017-10-24 ENCOUNTER — Other Ambulatory Visit: Payer: Self-pay | Admitting: Family Medicine

## 2017-11-08 MED FILL — METFORMIN HCL ER 500 MG TAB: 500 | 50 days supply | Qty: 100 | Fill #2

## 2017-11-15 ENCOUNTER — Ambulatory Visit: Payer: 59 | Admitting: Family Medicine

## 2017-11-16 ENCOUNTER — Other Ambulatory Visit: Payer: Self-pay | Admitting: Hematology and Oncology

## 2017-11-20 ENCOUNTER — Other Ambulatory Visit: Payer: Self-pay | Admitting: Family Medicine

## 2017-11-24 ENCOUNTER — Other Ambulatory Visit: Payer: Self-pay

## 2017-11-24 ENCOUNTER — Ambulatory Visit (INDEPENDENT_AMBULATORY_CARE_PROVIDER_SITE_OTHER): Payer: 59 | Admitting: Family Medicine

## 2017-11-24 ENCOUNTER — Encounter: Payer: Self-pay | Admitting: Family Medicine

## 2017-11-24 VITALS — BP 128/78 | HR 91 | Temp 98.3°F | Ht 61.0 in | Wt 188.3 lb

## 2017-11-24 DIAGNOSIS — E783 Hyperchylomicronemia: Secondary | ICD-10-CM

## 2017-11-24 DIAGNOSIS — E118 Type 2 diabetes mellitus with unspecified complications: Secondary | ICD-10-CM

## 2017-11-24 DIAGNOSIS — I10 Essential (primary) hypertension: Secondary | ICD-10-CM | POA: Diagnosis not present

## 2017-11-24 DIAGNOSIS — E1165 Type 2 diabetes mellitus with hyperglycemia: Secondary | ICD-10-CM

## 2017-11-24 LAB — POCT GLYCOSYLATED HEMOGLOBIN (HGB A1C)
HEMOGLOBIN A1C: 8.1 % (ref 4.0–5.6)
HEMOGLOBIN A1C: 8.1 % — AB (ref 4.0–5.6)

## 2017-11-24 MED ORDER — LOSARTAN POTASSIUM-HCTZ 50-12.5 MG PO TABS: 1.0000 | ORAL_TABLET | Freq: Every day | ORAL | 3 refills | Status: DC

## 2017-11-24 MED ORDER — ATORVASTATIN CALCIUM 20 MG PO TABS: 20.0000 mg | ORAL_TABLET | Freq: Every day | ORAL | 3 refills | Status: DC

## 2017-11-24 MED ORDER — METFORMIN HCL ER 500 MG PO TB24: ORAL_TABLET | ORAL | 3 refills | Status: DC

## 2017-11-24 MED ORDER — GLIMEPIRIDE 4 MG PO TABS: 4.0000 mg | ORAL_TABLET | Freq: Every morning | ORAL | 3 refills | Status: DC

## 2017-11-24 NOTE — Progress Notes (Signed)
Subjective:     Patient ID: [name] [name], female   DOB: 1934/04/29, 83 y.o.   MRN: [id number]  HPI Patient seen for medical follow-up.  Type 2 diabetes.  History of fair control.  Last A1c 7.8%.  She has been less compliant with diet since follow-up.  Also, her dog died and she has been walking less since last visit.  She remains on Amaryl and metformin.  Blood sugars relatively stable by home readings.  No polyuria or polydipsia.  No recent hypoglycemia symptoms.  Hypertension treated with losartan HCTZ.  No recent dizziness.  She is on Lipitor for hyperlipidemia.  No myalgias.  She has history of breast cancer and is followed by oncology and treated with Arimidex.  She states she had eye exam back September 5.  She declines flu vaccines.  Past Medical History:  Diagnosis Date  . Arthritis   . Breast cancer ([place])   . Breast cancer of upper-outer quadrant of right female breast ([place]) 06/04/2015  . Chronic kidney disease    stones  . Diabetes mellitus   . Hyperlipidemia   . Hypertension   . Personal history of radiation therapy   . PONV (postoperative nausea and vomiting)   . UTI (urinary tract infection)    Past Surgical History:  Procedure Laterality Date  . ABDOMINAL HYSTERECTOMY  1980  . BREAST LUMPECTOMY     right breast 2017  . CATARACT EXTRACTION    . CHOLECYSTECTOMY  1989  . COLONOSCOPY    . RADIOACTIVE SEED GUIDED PARTIAL MASTECTOMY WITH AXILLARY SENTINEL LYMPH NODE BIOPSY Right 07/09/2015   Procedure: RADIOACTIVE SEED GUIDED PARTIAL MASTECTOMY WITH AXILLARY SENTINEL LYMPH NODE BIOPSY;  Surgeon: Erroll Luna, MD;  Location: [place];  Service: General;  Laterality: Right;  . RE-EXCISION OF BREAST LUMPECTOMY Right 07/31/2015   Procedure: RE-EXCISION OF RIGHT BREAST LUMPECTOMY;  Surgeon: Erroll Luna, MD;  Location: [place];  Service: General;  Laterality: Right;  . TONSILLECTOMY  1953    reports that she has never smoked. She has never used smokeless  tobacco. She reports that she does not drink alcohol or use drugs. family history includes Cancer in her mother; Diabetes in her brother and sister; Heart disease (age of onset: 29) in her father. Allergies  Allergen Reactions  . Percocet [Oxycodone-Acetaminophen] Nausea And Vomiting  . Acetaminophen Nausea And Vomiting and Other (See Comments)    Allergic to Tylenol #3  . Actos [Pioglitazone Hydrochloride] Hives  . Latex Swelling and Other (See Comments)    Lip swelling  . Lisinopril Cough  . Shrimp [Shellfish Allergy] Itching  . Zocor [Simvastatin - High Dose] Itching  . Penicillins Rash and Other (See Comments)    Has patient had a PCN reaction causing immediate rash, facial/tongue/throat swelling, SOB or lightheadedness with hypotension: yes Has patient had a PCN reaction causing severe rash involving mucus membranes or skin necrosis: no Has patient had a PCN reaction that required hospitalization no Has patient had a PCN reaction occurring within the last 10 years: no If all of the above answers are "NO", then may proceed with Cephalosporin use.      Review of Systems  Constitutional: Negative for fatigue and unexpected weight change.  Eyes: Negative for visual disturbance.  Respiratory: Negative for cough, chest tightness, shortness of breath and wheezing.   Cardiovascular: Negative for chest pain, palpitations and leg swelling.  Endocrine: Negative for polydipsia and polyuria.  Neurological: Negative for dizziness, seizures, syncope, weakness, light-headedness and headaches.  Objective:   Physical Exam  Constitutional: She appears well-developed and well-nourished.  Eyes: Pupils are equal, round, and reactive to light.  Neck: Neck supple. No JVD present. No thyromegaly present.  Cardiovascular: Normal rate and regular rhythm. Exam reveals no gallop.  Pulmonary/Chest: Effort normal and breath sounds normal. No respiratory distress. She has no wheezes. She has no  rales.  Musculoskeletal: She exhibits no edema.  Neurological: She is alert.  Skin:  Feet reveal no skin lesions. Good distal foot pulses. Good capillary refill. No calluses. Normal sensation with monofilament testing        Assessment:     1. type 2 diabetes suboptimally controlled with A1c up to 8.1%  2.  Hypertension stable and at goal  3.  Hyperlipidemia treated with Lipitor    Plan:     -We discussed options for diabetes management.  She is reluctant to go on further medication at this time.  She will try to increase her walking, reduce sugars and starches, recheck A1c in 3 months.  If not improved at that point consider additional medication -We refilled all of her medications as she is changing pharmacies.  [facility] -Flu vaccine recommended and patient declined  Eulas Post MD [place] [facility] at [facility]

## 2017-11-24 NOTE — Patient Instructions (Signed)
Reduce sugars and starches  Let's plan on 3 month follow up  Consider Glucerna supplement- if taking any supplements.

## 2017-12-07 ENCOUNTER — Other Ambulatory Visit: Payer: Self-pay | Admitting: Family Medicine

## 2017-12-08 NOTE — Telephone Encounter (Signed)
Last OV 11/24/17, Next OV 02/26/2018  Last filled 11/24/17, # 90 with 3 refills  Please advise alternative.

## 2017-12-10 NOTE — Telephone Encounter (Signed)
OK to change to Atacand HCTZ 16/12.5 mg one daily #90 with 3 refills.

## 2018-02-15 ENCOUNTER — Other Ambulatory Visit: Payer: Self-pay | Admitting: Hematology and Oncology

## 2018-02-26 ENCOUNTER — Other Ambulatory Visit: Payer: Self-pay

## 2018-02-26 ENCOUNTER — Encounter: Payer: Self-pay | Admitting: Family Medicine

## 2018-02-26 ENCOUNTER — Ambulatory Visit (INDEPENDENT_AMBULATORY_CARE_PROVIDER_SITE_OTHER): Payer: 59 | Admitting: Family Medicine

## 2018-02-26 VITALS — BP 136/78 | HR 83 | Temp 97.6°F | Ht 61.0 in | Wt 184.9 lb

## 2018-02-26 DIAGNOSIS — E1165 Type 2 diabetes mellitus with hyperglycemia: Secondary | ICD-10-CM | POA: Diagnosis not present

## 2018-02-26 DIAGNOSIS — I10 Essential (primary) hypertension: Secondary | ICD-10-CM

## 2018-02-26 DIAGNOSIS — E783 Hyperchylomicronemia: Secondary | ICD-10-CM

## 2018-02-26 LAB — BASIC METABOLIC PANEL
BUN: 10 mg/dL (ref 6–23)
CALCIUM: 9.5 mg/dL (ref 8.4–10.5)
CO2: 30 mEq/L (ref 19–32)
CREATININE: 0.71 mg/dL (ref 0.40–1.20)
Chloride: 102 mEq/L (ref 96–112)
GFR: 101.36 mL/min (ref >60.00)
Glucose, Bld: 128 mg/dL — ABNORMAL HIGH (ref 70–99)
Potassium: 3.6 mEq/L (ref 3.5–5.1)
Sodium: 139 mEq/L (ref 135–145)

## 2018-02-26 LAB — HEPATIC FUNCTION PANEL
ALK PHOS: 88 U/L (ref 39–117)
ALT: 11 U/L (ref 0–35)
AST: 14 U/L (ref 0–37)
Albumin: 4.1 g/dL (ref 3.5–5.2)
BILIRUBIN TOTAL: 1 mg/dL (ref 0.2–1.2)
Bilirubin, Direct: 0.2 mg/dL (ref 0.0–0.3)
Total Protein: 6.4 g/dL (ref 6.0–8.3)

## 2018-02-26 LAB — LIPID PANEL
Cholesterol: 113 mg/dL (ref 0–200)
HDL: 61.1 mg/dL (ref >39.00)
LDL Cholesterol: 39 mg/dL (ref 0–99)
NonHDL: 51.9
Total CHOL/HDL Ratio: 2
Triglycerides: 66 mg/dL (ref 0.0–149.0)
VLDL: 13.2 mg/dL (ref 0.0–40.0)

## 2018-02-26 LAB — POCT GLYCOSYLATED HEMOGLOBIN (HGB A1C): Hemoglobin A1C: 7.3 % — AB (ref 4.0–5.6)

## 2018-02-26 NOTE — Progress Notes (Signed)
Subjective:     Patient ID: [name] [name], female   DOB: 1935-01-26, 84 y.o.   MRN: [id number]  HPI Patient seen for medical follow-up  Type 2 diabetes.  She had poor control with A1c 8.1% back in the fall.  She declined further medications.  She has lost about 4 pounds by reducing sugars and starches.  A1c is improved today 7.3%.  We also had switched her not long ago to extended release metformin because of loose stools.  Her loose stools resolved with change of medication.  She is very pleased with that.  She is requesting a new glucose meter  Hypertension treated with Atacand HCTZ.  Her blood pressures been stable.  No headaches.  No dizziness.  No recent chest pains.  She has history of breast cancer remains on Arimidex.  She had bone density scan last year with no evidence for osteopenia  Past Medical History:  Diagnosis Date  . Arthritis   . Breast cancer ([place])   . Breast cancer of upper-outer quadrant of right female breast ([place]) 06/04/2015  . Chronic kidney disease    stones  . Diabetes mellitus   . Hyperlipidemia   . Hypertension   . Personal history of radiation therapy   . PONV (postoperative nausea and vomiting)   . UTI (urinary tract infection)    Past Surgical History:  Procedure Laterality Date  . ABDOMINAL HYSTERECTOMY  1980  . BREAST LUMPECTOMY     right breast 2017  . CATARACT EXTRACTION    . CHOLECYSTECTOMY  1989  . COLONOSCOPY    . RADIOACTIVE SEED GUIDED PARTIAL MASTECTOMY WITH AXILLARY SENTINEL LYMPH NODE BIOPSY Right 07/09/2015   Procedure: RADIOACTIVE SEED GUIDED PARTIAL MASTECTOMY WITH AXILLARY SENTINEL LYMPH NODE BIOPSY;  Surgeon: Erroll Luna, MD;  Location: [place];  Service: General;  Laterality: Right;  . RE-EXCISION OF BREAST LUMPECTOMY Right 07/31/2015   Procedure: RE-EXCISION OF RIGHT BREAST LUMPECTOMY;  Surgeon: Erroll Luna, MD;  Location: [place];  Service: General;  Laterality: Right;  . TONSILLECTOMY  1953    reports  that she has never smoked. She has never used smokeless tobacco. She reports that she does not drink alcohol or use drugs. family history includes Cancer in her mother; Diabetes in her brother and sister; Heart disease (age of onset: 65) in her father. Allergies  Allergen Reactions  . Percocet [Oxycodone-Acetaminophen] Nausea And Vomiting  . Acetaminophen Nausea And Vomiting and Other (See Comments)    Allergic to Tylenol #3  . Actos [Pioglitazone Hydrochloride] Hives  . Latex Swelling and Other (See Comments)    Lip swelling  . Lisinopril Cough  . Shrimp [Shellfish Allergy] Itching  . Zocor [Simvastatin - High Dose] Itching  . Penicillins Rash and Other (See Comments)    Has patient had a PCN reaction causing immediate rash, facial/tongue/throat swelling, SOB or lightheadedness with hypotension: yes Has patient had a PCN reaction causing severe rash involving mucus membranes or skin necrosis: no Has patient had a PCN reaction that required hospitalization no Has patient had a PCN reaction occurring within the last 10 years: no If all of the above answers are "NO", then may proceed with Cephalosporin use.      Review of Systems  Constitutional: Negative for fatigue and unexpected weight change.  Eyes: Negative for visual disturbance.  Respiratory: Negative for cough, chest tightness, shortness of breath and wheezing.   Cardiovascular: Negative for chest pain, palpitations and leg swelling.  Neurological: Negative for dizziness, seizures,  syncope, weakness, light-headedness and headaches.       Objective:   Physical Exam Constitutional:      Appearance: She is well-developed.  Eyes:     Pupils: Pupils are equal, round, and reactive to light.  Neck:     Musculoskeletal: Neck supple.     Thyroid: No thyromegaly.     Vascular: No JVD.  Cardiovascular:     Rate and Rhythm: Normal rate and regular rhythm.     Heart sounds: No gallop.   Pulmonary:     Effort: Pulmonary effort  is normal. No respiratory distress.     Breath sounds: Normal breath sounds. No wheezing or rales.  Musculoskeletal:     Right lower leg: No edema.     Left lower leg: No edema.  Neurological:     General: No focal deficit present.     Mental Status: She is alert.        Assessment:     #1 hypertension stable  #2 hyperlipidemia.  Patient remains on Lipitor.  #3 type 2 diabetes improved with recent reduction in sugars and starches.  Her weight is down a few pounds and her A1c has reduced from 8.1 to 7.3%    Plan:     -Continue sugar and starch reduction and continue weight loss efforts -We have elected not to add any additional medications at this time.  We will look at getting her new home glucose meter -Check labs with lipid panel, hepatic panel, basic metabolic panel -We have again recommended flu vaccine but she declines  Eulas Post MD  Primary Care at [facility]

## 2018-02-26 NOTE — Patient Instructions (Signed)
Keep up the good job with sugar and starch reduction.  A1C improved to 7.3%! (Had been 8.1)

## 2018-02-26 NOTE — Addendum Note (Signed)
Addended by: Anibal Henderson on: 02/26/2018 10:19 AM   Modules accepted: Orders

## 2018-03-02 ENCOUNTER — Other Ambulatory Visit: Payer: Self-pay | Admitting: Family Medicine

## 2018-05-11 ENCOUNTER — Other Ambulatory Visit: Payer: Self-pay | Admitting: Hematology and Oncology

## 2018-06-19 ENCOUNTER — Telehealth: Payer: Self-pay | Admitting: Family Medicine

## 2018-06-19 NOTE — Telephone Encounter (Signed)
Copied from [place] [id number]. Topic: Quick Communication - Rx Refill/Question >> Jun 19, 2018 11:58 AM Waylan Rocher, Lumin L wrote: Medication: glimepiride (AMARYL) 4 MG tablet, atorvastatin (LIPITOR) 20 MG tablet, losartan 5mg  (90 day supply for all)  Has the patient contacted their pharmacy? {yes  (Agent: If no, request that the patient contact the pharmacy for the refill.) (Agent: If yes, when and what did the pharmacy advise?) contact office  Preferred Pharmacy (with phone number or street name): [id number] [address], [place] [address] hone: [phone] Fax: [phone]  Agent: Please be advised that RX refills may take up to 3 business days. We ask that you follow-up with your pharmacy.

## 2018-06-20 ENCOUNTER — Telehealth: Payer: Self-pay

## 2018-06-20 ENCOUNTER — Other Ambulatory Visit: Payer: Self-pay | Admitting: *Deleted

## 2018-06-20 MED ORDER — GLIMEPIRIDE 4 MG PO TABS: 4.0000 mg | ORAL_TABLET | Freq: Every morning | ORAL | 1 refills | Status: DC

## 2018-06-20 MED ORDER — ATORVASTATIN CALCIUM 20 MG PO TABS: 20.0000 mg | ORAL_TABLET | Freq: Every day | ORAL | 1 refills | Status: DC

## 2018-06-20 MED ORDER — ANASTROZOLE 1 MG PO TABS: ORAL_TABLET | ORAL | 0 refills | Status: DC

## 2018-06-20 NOTE — Telephone Encounter (Signed)
Her list says Atacand.  We need to clarify with pharmacy.  They do not make a Losartan 5 mg

## 2018-06-20 NOTE — Telephone Encounter (Signed)
I have sent patients Lipitor and Amaryl but I do not see Losartsan 5mg  on her medication list?? Is patient currently taking this?  Please advise.

## 2018-06-22 ENCOUNTER — Other Ambulatory Visit: Payer: Self-pay

## 2018-06-22 MED ORDER — LOSARTAN POTASSIUM-HCTZ 50-12.5 MG PO TABS: 1.0000 | ORAL_TABLET | Freq: Every day | ORAL | 0 refills | Status: DC

## 2018-06-22 NOTE — Telephone Encounter (Signed)
Refill for 6 months.  Need to take Atacand off list to avoid further confusion.  Make sure she has 3 month follow up.

## 2018-06-22 NOTE — Telephone Encounter (Signed)
Medication has been sent to pharmacy and Atacand has been deleted from med list.

## 2018-06-22 NOTE — Telephone Encounter (Signed)
Called patient and she stated that she has been taking the Losartan-hctz--50-12.5 and she wants to keep taking this and she feels fine and asked if this can be refilled? She never received the Atacand and when I looked into this medication I see that the transmission failed when it was sent in October.  Please advise if OK to send losartan-hctz 50-12.5

## 2018-07-23 NOTE — Assessment & Plan Note (Signed)
Right lumpectomy 07/09/2015: Invasive lobular cancer, grade 2, 1.9 cm, LCIS, ADH, lateral margin focally positive, 0/2 lymph nodes negative, ER 95%, PR 80%, Ki-67 15%, HER-2 negative, T1cN0 stage IA  Adjuvant radiation therapy 09/01/2015 to 09/28/2015  Treatment plan: Adjuvant antiestrogen therapy with anastrozole 1 mg by mouth daily 5-10 years  Anastrozole Toxicities: 1. Occasional hot flashes 2. Myalgias/arthralgias   Breast cancer surveillance:  1.Breast exam 07/28/2017: Benign 2. Mammogram 08/03/2017: Benign, breast density category B 3. Bone density 08/02/2016: T score -0.9: Normal   Mild breast lymphedema: I encouraged her to continue with the massages to help improve the drainage of the breasts.  RTC in1 yearfor follow up.

## 2018-07-29 NOTE — Progress Notes (Signed)
HEMATOLOGY-ONCOLOGY DOXIMITY VISIT PROGRESS NOTE  I connected with [name] [name] on 07/30/2018 at  8:45 AM EDT by Doximity video conference and verified that I am speaking with the correct person using two identifiers.  I discussed the limitations, risks, security and privacy concerns of performing an evaluation and management service by Doximity and the availability of in person appointments.  I also discussed with the patient that there may be a patient responsible charge related to this service. The patient expressed understanding and agreed to proceed.   Patient's Location: Home Physician Location: Clinic  CHIEF COMPLIANT: Follow-up on anastrozole therapy  INTERVAL HISTORY: [name] [name] is a 84 y.o. female with above-mentioned history of right breast cancer treated with lumpectomy, radiation, and who is currently on anastrozole therapy. I last saw her a year ago. Her most recent mammogram on 08/03/17 showed no evidence of malignancy bilaterally. She presents over My Chart for annual follow-up.     Breast cancer of upper-outer quadrant of right female breast ([place])   06/02/2015 Initial Diagnosis    Right breast biopsy 10:00 position: Invasive lobular cancer with LCIS, ER 95%, PR 80%, Ki-67 15%, HER-2 negative duration 1.61, ultrasound revealed a 1.9 x 1 x 1.4 cm lesion, T1 cN0 stage IA clinical stage    07/09/2015 Surgery    Right lumpectomy: Invasive lobular cancer, grade 2, 1.9 cm, LCIS, ADH, lateral margin focally positive, 0/2 lymph nodes negative, ER 95%, PR 80%, Ki-67 15%, HER-2 negative, T1cN0 stage IA    07/31/2015 Surgery    Reresection surgical margins: Clear, Benign    09/01/2015 - 09/28/2015 Radiation Therapy    Adjuvant radiation therapy    10/22/2015 -  Anti-estrogen oral therapy    Anastrozole 1 mg daily     REVIEW OF SYSTEMS:   Constitutional: Denies fevers, chills or abnormal weight loss Eyes: Denies blurriness of vision Ears, nose, mouth, throat, and face: Denies  mucositis or sore throat Respiratory: Denies cough, dyspnea or wheezes Cardiovascular: Denies palpitation, chest discomfort Gastrointestinal:  Denies nausea, heartburn or change in bowel habits Skin: Denies abnormal skin rashes Lymphatics: Denies new lymphadenopathy or easy bruising Neurological:Denies numbness, tingling or new weaknesses Behavioral/Psych: Mood is stable, no new changes  Extremities: No lower extremity edema Breast: denies any pain or lumps or nodules in either breasts All other systems were reviewed with the patient and are negative.  Observations/Objective:  There were no vitals filed for this visit. There is no height or weight on file to calculate BMI.  I have reviewed the data as listed CMP Latest Ref Rng & Units 02/26/2018 05/15/2017 05/18/2016  Glucose 70 - 99 mg/dL 128(H) 203(H) 117(H)  BUN 6 - 23 mg/dL _0 Creatinine 0.40 - 1.20 mg/dL 0.71 0.62 0.70  Sodium 135 - 145 mEq/L 139 138 140  Potassium 3.5 - 5.1 mEq/L 3.6 3.5 3.7  Chloride 96 - 112 mEq/L 102 102 104  CO2 19 - 32 mEq/L _1 Calcium 8.4 - 10.5 mg/dL 9.5 9.1 9.3  Total Protein 6.0 - 8.3 g/dL 6.4 6.6 6.4  Total Bilirubin 0.2 - 1.2 mg/dL 1.0 1.2 1.0  Alkaline Phos 39 - 117 U/L 88 91 80  AST 0 - 37 U/L _2 ALT 0 - 35 U/L _3 Lab Results  Component Value Date   WBC 5.0 07/31/2015   HGB 11.5 (L) 07/31/2015   HCT 35.6 (L) 07/31/2015   MCV 93.9 07/31/2015   PLT  178 07/31/2015   NEUTROABS 4.0 06/10/2015      Assessment Plan:  Breast cancer of upper-outer quadrant of right female breast ([place]) Right lumpectomy 07/09/2015: Invasive lobular cancer, grade 2, 1.9 cm, LCIS, ADH, lateral margin focally positive, 0/2 lymph nodes negative, ER 95%, PR 80%, Ki-67 15%, HER-2 negative, T1cN0 stage IA  Adjuvant radiation therapy 09/01/2015 to 09/28/2015  Treatment plan: Adjuvant antiestrogen therapy with anastrozole 1 mg by mouth daily 5 years  Anastrozole Toxicities: 1. Occasional  hot flashes 2. Myalgias/arthralgias   Breast cancer surveillance:  1.Breast exam 07/28/2017: Benign 2. Mammogram 08/03/2017: Benign, breast density category B 3. Bone density 08/02/2016: T score -0.9: Normal  Mild breast lymphedema: I encouraged her to continue with the massages to help improve the drainage of the breasts.  RTC in1 yearfor follow up.  I discussed the assessment and treatment plan with the patient. The patient was provided an opportunity to ask questions and all were answered. The patient agreed with the plan and demonstrated an understanding of the instructions. The patient was advised to call back or seek an in-person evaluation if the symptoms worsen or if the condition fails to improve as anticipated.   I provided 15 minutes of face-to-face Doximity time during this encounter.    Rulon Eisenmenger, MD 07/30/2018   I, Molly Dorshimer, am acting as scribe for Nicholas Lose, MD.  I have reviewed the above documentation for accuracy and completeness, and I agree with the above.

## 2018-07-30 ENCOUNTER — Inpatient Hospital Stay: Payer: Medicare Other | Attending: Hematology and Oncology | Admitting: Hematology and Oncology

## 2018-07-30 DIAGNOSIS — Z79811 Long term (current) use of aromatase inhibitors: Secondary | ICD-10-CM

## 2018-07-30 DIAGNOSIS — Z923 Personal history of irradiation: Secondary | ICD-10-CM | POA: Diagnosis not present

## 2018-07-30 DIAGNOSIS — Z170 Estrogen receptor positive status [ER+]: Secondary | ICD-10-CM | POA: Diagnosis not present

## 2018-07-30 DIAGNOSIS — C50411 Malignant neoplasm of upper-outer quadrant of right female breast: Secondary | ICD-10-CM

## 2018-07-30 MED ORDER — ANASTROZOLE 1 MG PO TABS: ORAL_TABLET | ORAL | 3 refills | Status: DC

## 2018-08-02 LAB — HM DIABETES EYE EXAM

## 2018-08-13 ENCOUNTER — Ambulatory Visit
Admission: RE | Admit: 2018-08-13 | Discharge: 2018-08-13 | Disposition: A | Discharge status: Home / Self Care | Payer: 59 | Source: Ambulatory Visit | Attending: Hematology and Oncology | Admitting: Hematology and Oncology

## 2018-08-13 ENCOUNTER — Other Ambulatory Visit: Payer: Self-pay

## 2018-08-13 DIAGNOSIS — C50411 Malignant neoplasm of upper-outer quadrant of right female breast: Secondary | ICD-10-CM

## 2018-08-13 DIAGNOSIS — Z170 Estrogen receptor positive status [ER+]: Secondary | ICD-10-CM

## 2018-08-27 ENCOUNTER — Encounter: Payer: Self-pay | Admitting: Family Medicine

## 2018-08-27 ENCOUNTER — Other Ambulatory Visit: Payer: Self-pay

## 2018-08-27 ENCOUNTER — Ambulatory Visit: Payer: 59 | Admitting: Family Medicine

## 2018-08-27 VITALS — BP 118/64 | HR 77 | Temp 98.1°F | Ht 61.0 in | Wt 177.1 lb

## 2018-08-27 DIAGNOSIS — E1165 Type 2 diabetes mellitus with hyperglycemia: Secondary | ICD-10-CM

## 2018-08-27 DIAGNOSIS — I10 Essential (primary) hypertension: Secondary | ICD-10-CM

## 2018-08-27 DIAGNOSIS — L720 Epidermal cyst: Secondary | ICD-10-CM

## 2018-08-27 DIAGNOSIS — E783 Hyperchylomicronemia: Secondary | ICD-10-CM

## 2018-08-27 LAB — POCT GLYCOSYLATED HEMOGLOBIN (HGB A1C): Hemoglobin A1C: 7.2 % — AB (ref 4.0–5.6)

## 2018-08-27 NOTE — Patient Instructions (Signed)
Epidermal Cyst  An epidermal cyst is a sac made of skin tissue. The sac contains a substance called keratin. Keratin is a protein that is normally secreted through the hair follicles. When keratin becomes trapped in the top layer of skin (epidermis), it can form an epidermal cyst. Epidermal cysts can be found anywhere on your body. These cysts are usually harmless (benign), and they may not cause symptoms unless they become infected. What are the causes? This condition may be caused by:  A blocked hair follicle.  A hair that curls and re-enters the skin instead of growing straight out of the skin (ingrown hair).  A blocked pore.  Irritated skin.  An injury to the skin.  Certain conditions that are passed along from parent to child (inherited).  Human papillomavirus (HPV).  Long-term (chronic) sun damage to the skin. What increases the risk? The following factors may make you more likely to develop an epidermal cyst:  Having acne.  Being overweight.  Being 30-40 years old. What are the signs or symptoms? The only symptom of this condition may be a small, painless lump underneath the skin. When an epidermal cyst ruptures, it may become infected. Symptoms may include:  Redness.  Inflammation.  Tenderness.  Warmth.  Fever.  Keratin draining from the cyst. Keratin is grayish-white, bad-smelling substance.  Pus draining from the cyst. How is this diagnosed? This condition is diagnosed with a physical exam.  In some cases, you may have a sample of tissue (biopsy) taken from your cyst to be examined under a microscope or tested for bacteria.  You may be referred to a health care provider who specializes in skin care (dermatologist). How is this treated? In many cases, epidermal cysts go away on their own without treatment. If a cyst becomes infected, treatment may include:  Opening and draining the cyst, done by a health care provider. After draining, minor surgery to  remove the rest of the cyst may be done.  Antibiotic medicine.  Injections of medicines (steroids) that help to reduce inflammation.  Surgery to remove the cyst. Surgery may be done if the cyst: ? Becomes large. ? Bothers you. ? Has a chance of turning into cancer.  Do not try to open a cyst yourself. Follow these instructions at home:  Take over-the-counter and prescription medicines only as told by your health care provider.  If you were prescribed an antibiotic medicine, take it it as told by your health care provider. Do not stop using the antibiotic even if you start to feel better.  Keep the area around your cyst clean and dry.  Wear loose, dry clothing.  Avoid touching your cyst.  Check your cyst every day for signs of infection. Check for: ? Redness, swelling, or pain. ? Fluid or blood. ? Warmth. ? Pus or a bad smell.  Keep all follow-up visits as told by your health care provider. This is important. How is this prevented?  Wear clean, dry, clothing.  Avoid wearing tight clothing.  Keep your skin clean and dry. Take showers or baths every day. Contact a health care provider if:  Your cyst develops symptoms of infection.  Your condition is not improving or is getting worse.  You develop a cyst that looks different from other cysts you have had.  You have a fever. Get help right away if:  Redness spreads from the cyst into the surrounding area. Summary  An epidermal cyst is a sac made of skin tissue. These cysts are   usually harmless (benign), and they may not cause symptoms unless they become infected.  If a cyst becomes infected, treatment may include surgery to open and drain the cyst, or to remove it. Treatment may also include medicines by mouth or through an injection.  Take over-the-counter and prescription medicines only as told by your health care provider. If you were prescribed an antibiotic medicine, take it as told by your health care  provider. Do not stop using the antibiotic even if you start to feel better.  Contact a health care provider if your condition is not improving or is getting worse.  Keep all follow-up visits as told by your health care provider. This is important. This information is not intended to replace advice given to you by your health care provider. Make sure you discuss any questions you have with your health care provider. Document Released: 01/09/2004 Document Revised: 05/31/2018 Document Reviewed: 08/21/2017 Elsevier Patient Education  2020 Elsevier Inc.  

## 2018-08-27 NOTE — Progress Notes (Signed)
Subjective:     Patient ID: [name] [name], female   DOB: June 03, 1934, 84 y.o.   MRN: [id number]  HPI Patient seen for medical follow-up.  She has history of obesity, history of breast cancer, hypertension, hyperlipidemia, type 2 diabetes.  She has been fairly diligently with doing her exercise bike couple times a week and walking.  She is lost another 7 pounds which she attributes to reducing carbohydrates.  Last A1c 7.3% which is down from 8.1% previously.  Fasting blood sugars consistently low 100s.  1 hour postprandials around 170.  Medications reviewed.  Compliant with all.  Denies any medication side effects.  No recent hypoglycemia.  No dizziness.  No chest pains.  Small cyst right side.  Sometimes irritated by her bra.  She first noticed this a few months ago.  Not growing rapidly in size  Past Medical History:  Diagnosis Date  . Arthritis   . Breast cancer ([place])   . Breast cancer of upper-outer quadrant of right female breast ([place]) 06/04/2015  . Chronic kidney disease    stones  . Diabetes mellitus   . Hyperlipidemia   . Hypertension   . Personal history of radiation therapy   . PONV (postoperative nausea and vomiting)   . UTI (urinary tract infection)    Past Surgical History:  Procedure Laterality Date  . ABDOMINAL HYSTERECTOMY  1980  . BREAST BIOPSY Right 2018/6   benign  . BREAST LUMPECTOMY Right 2017   right breast 2017  . CATARACT EXTRACTION    . CHOLECYSTECTOMY  1989  . COLONOSCOPY    . RADIOACTIVE SEED GUIDED PARTIAL MASTECTOMY WITH AXILLARY SENTINEL LYMPH NODE BIOPSY Right 07/09/2015   Procedure: RADIOACTIVE SEED GUIDED PARTIAL MASTECTOMY WITH AXILLARY SENTINEL LYMPH NODE BIOPSY;  Surgeon: Erroll Luna, MD;  Location: [place];  Service: General;  Laterality: Right;  . RE-EXCISION OF BREAST LUMPECTOMY Right 07/31/2015   Procedure: RE-EXCISION OF RIGHT BREAST LUMPECTOMY;  Surgeon: Erroll Luna, MD;  Location: [place];  Service: General;   Laterality: Right;  . TONSILLECTOMY  1953    reports that she has never smoked. She has never used smokeless tobacco. She reports that she does not drink alcohol or use drugs. family history includes Cancer in her mother; Diabetes in her brother and sister; Heart disease (age of onset: 12) in her father. Allergies  Allergen Reactions  . Percocet [Oxycodone-Acetaminophen] Nausea And Vomiting  . Acetaminophen Nausea And Vomiting and Other (See Comments)    Allergic to Tylenol #3  . Actos [Pioglitazone Hydrochloride] Hives  . Latex Swelling and Other (See Comments)    Lip swelling  . Lisinopril Cough  . Shrimp [Shellfish Allergy] Itching  . Zocor [Simvastatin - High Dose] Itching  . Penicillins Rash and Other (See Comments)    Has patient had a PCN reaction causing immediate rash, facial/tongue/throat swelling, SOB or lightheadedness with hypotension: yes Has patient had a PCN reaction causing severe rash involving mucus membranes or skin necrosis: no Has patient had a PCN reaction that required hospitalization no Has patient had a PCN reaction occurring within the last 10 years: no If all of the above answers are "NO", then may proceed with Cephalosporin use.      Review of Systems  Constitutional: Negative for fatigue and unexpected weight change.  Eyes: Negative for visual disturbance.  Respiratory: Negative for cough, chest tightness, shortness of breath and wheezing.   Cardiovascular: Negative for chest pain, palpitations and leg swelling.  Gastrointestinal: Negative for abdominal  pain.  Endocrine: Negative for polydipsia and polyuria.  Genitourinary: Negative for dysuria.  Neurological: Negative for dizziness, seizures, syncope, weakness, light-headedness and headaches.       Objective:   Physical Exam Constitutional:      Appearance: She is well-developed.  Eyes:     Pupils: Pupils are equal, round, and reactive to light.  Neck:     Musculoskeletal: Neck supple.      Thyroid: No thyromegaly.     Vascular: No JVD.  Cardiovascular:     Rate and Rhythm: Normal rate and regular rhythm.     Heart sounds: No gallop.   Pulmonary:     Effort: Pulmonary effort is normal. No respiratory distress.     Breath sounds: Normal breath sounds. No wheezing or rales.  Musculoskeletal:     Right lower leg: No edema.     Left lower leg: No edema.  Skin:    Comments: She has small approximately 1/2 cm blackened cyst consistent with epidermal cyst right side  Neurological:     Mental Status: She is alert.        Assessment:     #1 type 2 diabetes -improving control with A1c 7.2%.  She has done excellent job with weight loss and dietary modification  #2 hypertension well controlled  #3 hyperlipidemia treated with Lipitor.  Lipids were checked 6 months ago and stable and at goal  #4 benign-appearing epidermal cyst    Plan:     -Continue healthy lifestyle changes -Patient will consider whether to return to have cyst excised from her side.  She is reassured this is benign but this is sometimes irritating because of location -We recommend 34-month follow-up unless indicated by need sooner  Eulas Post MD [facility] at [facility]

## 2018-09-13 ENCOUNTER — Other Ambulatory Visit: Payer: Self-pay | Admitting: Hematology and Oncology

## 2018-09-18 ENCOUNTER — Other Ambulatory Visit: Payer: Self-pay | Admitting: Family Medicine

## 2018-09-20 ENCOUNTER — Encounter: Payer: Self-pay | Admitting: Family Medicine

## 2018-09-21 ENCOUNTER — Encounter: Payer: Self-pay | Admitting: Family Medicine

## 2018-09-21 NOTE — Progress Notes (Signed)
[facility]/thx dmf

## 2018-12-03 ENCOUNTER — Other Ambulatory Visit: Payer: Self-pay | Admitting: Family Medicine

## 2018-12-22 ENCOUNTER — Other Ambulatory Visit: Payer: Self-pay | Admitting: Family Medicine

## 2019-01-21 NOTE — Telephone Encounter (Signed)
Med refill. Sent to pharmacy.

## 2019-02-27 ENCOUNTER — Encounter: Payer: Self-pay | Admitting: Family Medicine

## 2019-02-27 ENCOUNTER — Other Ambulatory Visit: Payer: Self-pay

## 2019-02-27 ENCOUNTER — Ambulatory Visit: Payer: 59 | Admitting: Family Medicine

## 2019-02-27 VITALS — BP 112/80 | HR 76 | Temp 97.0°F | Ht 61.0 in | Wt 178.2 lb

## 2019-02-27 DIAGNOSIS — E1165 Type 2 diabetes mellitus with hyperglycemia: Secondary | ICD-10-CM | POA: Diagnosis not present

## 2019-02-27 DIAGNOSIS — L818 Other specified disorders of pigmentation: Secondary | ICD-10-CM

## 2019-02-27 DIAGNOSIS — I10 Essential (primary) hypertension: Secondary | ICD-10-CM | POA: Diagnosis not present

## 2019-02-27 DIAGNOSIS — E669 Obesity, unspecified: Secondary | ICD-10-CM

## 2019-02-27 DIAGNOSIS — E783 Hyperchylomicronemia: Secondary | ICD-10-CM

## 2019-02-27 LAB — HEPATIC FUNCTION PANEL
ALT: 11 U/L (ref 0–35)
AST: 14 U/L (ref 0–37)
Albumin: 4 g/dL (ref 3.5–5.2)
Alkaline Phosphatase: 85 U/L (ref 39–117)
Bilirubin, Direct: 0.2 mg/dL (ref 0.0–0.3)
Total Bilirubin: 1 mg/dL (ref 0.2–1.2)
Total Protein: 6.4 g/dL (ref 6.0–8.3)

## 2019-02-27 LAB — POCT GLYCOSYLATED HEMOGLOBIN (HGB A1C): Hemoglobin A1C: 7.1 % — AB (ref 4.0–5.6)

## 2019-02-27 LAB — BASIC METABOLIC PANEL
BUN: 11 mg/dL (ref 6–23)
CO2: 30 mEq/L (ref 19–32)
Calcium: 9.2 mg/dL (ref 8.4–10.5)
Chloride: 103 mEq/L (ref 96–112)
Creatinine, Ser: 0.71 mg/dL (ref 0.40–1.20)
GFR: 95.13 mL/min (ref >60.00)
Glucose, Bld: 173 mg/dL — ABNORMAL HIGH (ref 70–99)
Potassium: 3.5 mEq/L (ref 3.5–5.1)
Sodium: 138 mEq/L (ref 135–145)

## 2019-02-27 LAB — LIPID PANEL
Cholesterol: 114 mg/dL (ref 0–200)
HDL: 61.6 mg/dL (ref >39.00)
LDL Cholesterol: 38 mg/dL (ref 0–99)
NonHDL: 52.54
Total CHOL/HDL Ratio: 2
Triglycerides: 71 mg/dL (ref 0.0–149.0)
VLDL: 14.2 mg/dL (ref 0.0–40.0)

## 2019-02-27 NOTE — Patient Instructions (Signed)
Suspect rash on legs is idiopathic guttate hypomelanosis which is benign.   Topical creams will not help.

## 2019-02-27 NOTE — Progress Notes (Signed)
Subjective:     Patient ID: [name] [name], female   DOB: 1934-08-22, 85 y.o.   MRN: [id number]  HPI  [name] [name] is seen for medical follow-up.  She has history of type 2 diabetes and has had gradually improving control.  She lost significant amount of weight last year due to her efforts.  Her weight has, leveled off at this point.  Her fasting blood sugars generally range between 96 and 134 with a high recently of 157.  Her last A1c was 7.2%  She has hypertension which is treated with losartan HCTZ.  No recent dizziness.  She takes Lipitor 20 mg daily for hyperlipidemia.  She is due for lipids at this time.  No myalgias.  She has new issue of rash lower legs.  She has white "spots "which are asymptomatic.  She is noticed these for several months.  Nonscaly.  No alleviating or exacerbating factors.  She declines flu and pneumonia vaccinations  Past Medical History:  Diagnosis Date  . Arthritis   . Breast cancer ([place])   . Breast cancer of upper-outer quadrant of right female breast ([place]) 06/04/2015  . Chronic kidney disease    stones  . Diabetes mellitus   . Hyperlipidemia   . Hypertension   . Personal history of radiation therapy   . PONV (postoperative nausea and vomiting)   . UTI (urinary tract infection)    Past Surgical History:  Procedure Laterality Date  . ABDOMINAL HYSTERECTOMY  1980  . BREAST BIOPSY Right 2018/6   benign  . BREAST LUMPECTOMY Right 2017   right breast 2017  . CATARACT EXTRACTION    . CHOLECYSTECTOMY  1989  . COLONOSCOPY    . RADIOACTIVE SEED GUIDED PARTIAL MASTECTOMY WITH AXILLARY SENTINEL LYMPH NODE BIOPSY Right 07/09/2015   Procedure: RADIOACTIVE SEED GUIDED PARTIAL MASTECTOMY WITH AXILLARY SENTINEL LYMPH NODE BIOPSY;  Surgeon: Erroll Luna, MD;  Location: [place];  Service: General;  Laterality: Right;  . RE-EXCISION OF BREAST LUMPECTOMY Right 07/31/2015   Procedure: RE-EXCISION OF RIGHT BREAST LUMPECTOMY;  Surgeon: Erroll Luna,  MD;  Location: [place];  Service: General;  Laterality: Right;  . TONSILLECTOMY  1953    reports that she has never smoked. She has never used smokeless tobacco. She reports that she does not drink alcohol or use drugs. family history includes Cancer in her mother; Diabetes in her brother and sister; Heart disease (age of onset: 25) in her father. Allergies  Allergen Reactions  . Percocet [Oxycodone-Acetaminophen] Nausea And Vomiting  . Acetaminophen Nausea And Vomiting and Other (See Comments)    Allergic to Tylenol #3  . Actos [Pioglitazone Hydrochloride] Hives  . Latex Swelling and Other (See Comments)    Lip swelling  . Lisinopril Cough  . Shrimp [Shellfish Allergy] Itching  . Zocor [Simvastatin - High Dose] Itching  . Penicillins Rash and Other (See Comments)    Has patient had a PCN reaction causing immediate rash, facial/tongue/throat swelling, SOB or lightheadedness with hypotension: yes Has patient had a PCN reaction causing severe rash involving mucus membranes or skin necrosis: no Has patient had a PCN reaction that required hospitalization no Has patient had a PCN reaction occurring within the last 10 years: no If all of the above answers are "NO", then may proceed with Cephalosporin use.     Review of Systems  Constitutional: Negative for appetite change, fatigue and unexpected weight change.  Eyes: Negative for visual disturbance.  Respiratory: Negative for cough, chest tightness, shortness  of breath and wheezing.   Cardiovascular: Negative for chest pain, palpitations and leg swelling.  Skin: Positive for rash.  Neurological: Negative for dizziness, seizures, syncope, weakness, light-headedness and headaches.       Objective:   Physical Exam Vitals reviewed.  Constitutional:      Appearance: Normal appearance.  Cardiovascular:     Rate and Rhythm: Normal rate and regular rhythm.  Pulmonary:     Effort: Pulmonary effort is normal.     Breath sounds: Normal  breath sounds.  Skin:    Findings: Rash present.     Comments: She has several scattered small approximately 1 mm hypopigmented nonscaly areas on her lower legs  Neurological:     Mental Status: She is alert.        Assessment:     #1 hypertension well controlled by today's reading  #2 type 2 diabetes improving with A1c 7.1%  #3 hyperlipidemia treated with Lipitor and due for follow-up lipids  #4 idiopathic guttate hypomelanosis    Plan:     -Reassurance regarding her rash.  We explained that topicals would not really help in any way and this is more cosmetic -Flu vaccine and Pneumovax recommended patient declines -Check labs with basic metabolic panel, hepatic panel, lipid panel -We will plan routine follow-up in 6 months unless indicated otherwise  [name] Post MD [facility] at [facility]

## 2019-03-22 ENCOUNTER — Other Ambulatory Visit: Payer: Self-pay | Admitting: Family Medicine

## 2019-04-08 ENCOUNTER — Telehealth: Payer: Self-pay | Admitting: Family Medicine

## 2019-04-08 NOTE — Telephone Encounter (Signed)
Called pharmacy and spoke to pharmacist Verdis Frederickson and she stated that patient only wants Metformin from [facility] and they have stopped production due to the recall.  Please advise per message from patient.

## 2019-04-08 NOTE — Telephone Encounter (Signed)
Called patient and set up a telephone appointment for tomorrow at 9:15am. Patient verbalized an understanding.

## 2019-04-08 NOTE — Telephone Encounter (Signed)
If she is refusing to take Metformin from any other source would suggest setting up office follow-up or [facility] to discuss other options.

## 2019-04-08 NOTE — Telephone Encounter (Signed)
Pt's pharmacy ([facility]) told her that the Metformin has been recalled and they do not have it anymore.   Pt is not sure what to do.  She can be reached at [phone]

## 2019-04-09 ENCOUNTER — Telehealth: Payer: Self-pay | Admitting: Family Medicine

## 2019-04-09 ENCOUNTER — Other Ambulatory Visit: Payer: Self-pay

## 2019-04-09 ENCOUNTER — Other Ambulatory Visit: Payer: Self-pay | Admitting: *Deleted

## 2019-04-09 ENCOUNTER — Telehealth (INDEPENDENT_AMBULATORY_CARE_PROVIDER_SITE_OTHER): Payer: 59 | Admitting: Family Medicine

## 2019-04-09 DIAGNOSIS — E1165 Type 2 diabetes mellitus with hyperglycemia: Secondary | ICD-10-CM

## 2019-04-09 MED ORDER — METFORMIN HCL ER 500 MG PO TB24: ORAL_TABLET | ORAL | 0 refills | Status: DC

## 2019-04-09 MED ORDER — METFORMIN HCL ER 500 MG PO TB24: ORAL_TABLET | ORAL | 3 refills | Status: DC

## 2019-04-09 NOTE — Telephone Encounter (Signed)
[facility], please send it to [facility] at [facility]. Patient is out of Metformin.  Pharmacy phone: [phone]

## 2019-04-09 NOTE — Telephone Encounter (Signed)
Medication has been sent to the pharmacy requested. °

## 2019-04-09 NOTE — Progress Notes (Signed)
This visit type was conducted due to national recommendations for restrictions regarding the COVID-19 pandemic in an effort to limit this patient's exposure and mitigate transmission in our community.   Virtual Visit via Telephone Note  I connected with Araminta Derhammer on 04/09/19 at  9:15 AM EST by telephone and verified that I am speaking with the correct person using two identifiers.   I discussed the limitations, risks, security and privacy concerns of performing an evaluation and management service by telephone and the availability of in person appointments. I also discussed with the patient that there may be a patient responsible charge related to this service. The patient expressed understanding and agreed to proceed.  Location patient: home Location provider: work or home office Participants present for the call: patient, provider Patient did not have a visit in the prior 7 days to address this/these issue(s).   History of Present Illness:  Ms. Johnny did call because of difficulty getting her Metformin extended release 500 mg at her local pharmacy.  She has been on regimen of Metformin XR 500 mg 1 twice daily along with glimepiride 4 mg daily and blood sugars been fairly well controlled.  Recent A1c in January was 7.1%.  She had diarrhea with immediate release.  She has not checked with availability with other pharmacies including her mail order pharmacy.  She has had previous allergy with Actos with hives.  Past Medical History:  Diagnosis Date  . Arthritis   . Breast cancer ([place])   . Breast cancer of upper-outer quadrant of right female breast ([place]) 06/04/2015  . Chronic kidney disease    stones  . Diabetes mellitus   . Hyperlipidemia   . Hypertension   . Personal history of radiation therapy   . PONV (postoperative nausea and vomiting)   . UTI (urinary tract infection)    Past Surgical History:  Procedure Laterality Date  . ABDOMINAL HYSTERECTOMY  1980  . BREAST BIOPSY  Right 2018/6   benign  . BREAST LUMPECTOMY Right 2017   right breast 2017  . CATARACT EXTRACTION    . CHOLECYSTECTOMY  1989  . COLONOSCOPY    . RADIOACTIVE SEED GUIDED PARTIAL MASTECTOMY WITH AXILLARY SENTINEL LYMPH NODE BIOPSY Right 07/09/2015   Procedure: RADIOACTIVE SEED GUIDED PARTIAL MASTECTOMY WITH AXILLARY SENTINEL LYMPH NODE BIOPSY;  Surgeon: Erroll Luna, MD;  Location: [place];  Service: General;  Laterality: Right;  . RE-EXCISION OF BREAST LUMPECTOMY Right 07/31/2015   Procedure: RE-EXCISION OF RIGHT BREAST LUMPECTOMY;  Surgeon: Erroll Luna, MD;  Location: [place];  Service: General;  Laterality: Right;  . TONSILLECTOMY  1953    reports that she has never smoked. She has never used smokeless tobacco. She reports that she does not drink alcohol or use drugs. family history includes Cancer in her mother; Diabetes in her brother and sister; Heart disease (age of onset: 48) in her father. Allergies  Allergen Reactions  . Percocet [Oxycodone-Acetaminophen] Nausea And Vomiting  . Acetaminophen Nausea And Vomiting and Other (See Comments)    Allergic to Tylenol #3  . Actos [Pioglitazone Hydrochloride] Hives  . Latex Swelling and Other (See Comments)    Lip swelling  . Lisinopril Cough  . Shrimp [Shellfish Allergy] Itching  . Zocor [Simvastatin - High Dose] Itching  . Penicillins Rash and Other (See Comments)    Has patient had a PCN reaction causing immediate rash, facial/tongue/throat swelling, SOB or lightheadedness with hypotension: yes Has patient had a PCN reaction causing severe rash involving  mucus membranes or skin necrosis: no Has patient had a PCN reaction that required hospitalization no Has patient had a PCN reaction occurring within the last 10 years: no If all of the above answers are "NO", then may proceed with Cephalosporin use.       Observations/Objective: Patient sounds cheerful and well on the phone. I do not appreciate any SOB. Speech  and thought processing are grossly intact. Patient reported vitals:  Assessment and Plan:  Type 2 diabetes which has been reasonably well controlled.  She has had difficulties getting Metformin extended release as above  -We recommended sending her Metformin extended release prescription to mail order to see if they can fill this and if she is having difficulties with that she will try with another local pharmacy.  If we cannot get this filled we discussed other alternatives including DPP 4 inhibitor, SGLT2 class, or GLP-1 class.  Follow Up Instructions:  -as above   99441 [id number] [id number] 11-20 [id number] 21-30 I did not refer this patient for an OV in the next 24 hours for this/these issue(s).  I discussed the assessment and treatment plan with the patient. The patient was provided an opportunity to ask questions and all were answered. The patient agreed with the plan and demonstrated an understanding of the instructions.   The patient was advised to call back or seek an in-person evaluation if the symptoms worsen or if the condition fails to improve as anticipated.  I provided 17 minutes of non-face-to-face time during this encounter.   Carolann Littler, MD

## 2019-04-18 ENCOUNTER — Telehealth: Payer: Self-pay | Admitting: Family Medicine

## 2019-04-18 NOTE — Telephone Encounter (Signed)
Pt [facility] the Metformin she received looks different from what she is used to. It is oval shaped 1421 white pill and she is used to taking the round pill for it. She would like to know if it's ok to take the new looking Metformin?  Pt can be reached at [phone] per pt to leave a detailed vm at that number.

## 2019-04-19 NOTE — Telephone Encounter (Signed)
Yes   there are many different generic makers and that is why they look different but chemically they are the same.

## 2019-04-19 NOTE — Telephone Encounter (Signed)
Patient notified and verbalized understanding. 

## 2019-06-11 ENCOUNTER — Other Ambulatory Visit: Payer: Self-pay | Admitting: Family Medicine

## 2019-06-18 ENCOUNTER — Other Ambulatory Visit: Payer: Self-pay | Admitting: Family Medicine

## 2019-07-23 ENCOUNTER — Other Ambulatory Visit: Payer: Self-pay | Admitting: Hematology and Oncology

## 2019-07-23 DIAGNOSIS — Z1231 Encounter for screening mammogram for malignant neoplasm of breast: Secondary | ICD-10-CM

## 2019-08-07 LAB — HM DIABETES EYE EXAM

## 2019-08-09 ENCOUNTER — Encounter: Payer: Self-pay | Admitting: Family Medicine

## 2019-08-19 ENCOUNTER — Other Ambulatory Visit: Payer: Self-pay

## 2019-08-19 ENCOUNTER — Ambulatory Visit
Admission: RE | Admit: 2019-08-19 | Discharge: 2019-08-19 | Disposition: A | Discharge status: Home / Self Care | Payer: 59 | Source: Ambulatory Visit | Attending: Hematology and Oncology | Admitting: Hematology and Oncology

## 2019-08-19 ENCOUNTER — Other Ambulatory Visit: Payer: Self-pay | Admitting: Hematology and Oncology

## 2019-08-19 DIAGNOSIS — Z853 Personal history of malignant neoplasm of breast: Secondary | ICD-10-CM

## 2019-08-19 DIAGNOSIS — Z1231 Encounter for screening mammogram for malignant neoplasm of breast: Secondary | ICD-10-CM

## 2019-08-30 ENCOUNTER — Ambulatory Visit (INDEPENDENT_AMBULATORY_CARE_PROVIDER_SITE_OTHER): Payer: 59 | Admitting: Family Medicine

## 2019-08-30 ENCOUNTER — Encounter: Payer: Self-pay | Admitting: Family Medicine

## 2019-08-30 ENCOUNTER — Other Ambulatory Visit: Payer: Self-pay

## 2019-08-30 VITALS — BP 130/62 | HR 84 | Temp 97.6°F | Wt 177.7 lb

## 2019-08-30 DIAGNOSIS — L820 Inflamed seborrheic keratosis: Secondary | ICD-10-CM | POA: Diagnosis not present

## 2019-08-30 DIAGNOSIS — M1712 Unilateral primary osteoarthritis, left knee: Secondary | ICD-10-CM

## 2019-08-30 DIAGNOSIS — E1165 Type 2 diabetes mellitus with hyperglycemia: Secondary | ICD-10-CM | POA: Diagnosis not present

## 2019-08-30 LAB — POCT GLYCOSYLATED HEMOGLOBIN (HGB A1C): Hemoglobin A1C: 6.7 % — AB (ref 4.0–5.6)

## 2019-08-30 NOTE — Progress Notes (Signed)
Established Patient Office Visit  Subjective:  Patient ID: [name] [name], female    DOB: May 23, 1934  Age: 85 y.o. MRN: [id number]  CC:  Chief Complaint  Patient presents with  . Follow-up    pt is here to follow up on diabetes     HPI [name] [name] presents for medical follow-up.  She has history of type 2 diabetes.  Last A1c was 7.1%.  She is on extended release Metformin.  She has some difficulties getting this locally but was able to get through mail order pharmacy.  She had a low fasting sugar recently of 79 and a high 1 hour postprandial 198.  No polyuria or polydipsia.  Recent renal function normal.  Patient has had progressive left knee pains and some stiffness.  She had x-rays back in 2018 which show degenerative arthritis worse lateral compartment.  No recent injury.  She has hypertension which has been stable.  No recent dizziness.  Irritated skin lesion right anterior chest wall.  Sometimes itches.  Sometimes rubs against clothing.  Past Medical History:  Diagnosis Date  . Arthritis   . Breast cancer ([place]) 2017   Right Breast Cancer  . Breast cancer of upper-outer quadrant of right female breast ([place]) 06/04/2015  . Chronic kidney disease    stones  . Diabetes mellitus   . Hyperlipidemia   . Hypertension   . Personal history of radiation therapy 2017   Right Breast Cancer  . PONV (postoperative nausea and vomiting)   . UTI (urinary tract infection)     Past Surgical History:  Procedure Laterality Date  . ABDOMINAL HYSTERECTOMY  1980  . BREAST BIOPSY Right 2018/6   benign  . BREAST LUMPECTOMY Right 2017  . CATARACT EXTRACTION    . CHOLECYSTECTOMY  1989  . COLONOSCOPY    . RADIOACTIVE SEED GUIDED PARTIAL MASTECTOMY WITH AXILLARY SENTINEL LYMPH NODE BIOPSY Right 07/09/2015   Procedure: RADIOACTIVE SEED GUIDED PARTIAL MASTECTOMY WITH AXILLARY SENTINEL LYMPH NODE BIOPSY;  Surgeon: Erroll Luna, MD;  Location: [place];  Service: General;   Laterality: Right;  . RE-EXCISION OF BREAST LUMPECTOMY Right 07/31/2015   Procedure: RE-EXCISION OF RIGHT BREAST LUMPECTOMY;  Surgeon: Erroll Luna, MD;  Location: [place];  Service: General;  Laterality: Right;  . TONSILLECTOMY  1953    Family History  Problem Relation Age of Onset  . Cancer Mother        uterine  . Heart disease Father 49  . Diabetes Sister   . Diabetes Brother     Social History   Socioeconomic History  . Marital status: Widowed    Spouse name: Not on file  . Number of children: Not on file  . Years of education: Not on file  . Highest education level: Not on file  Occupational History  . Not on file  Tobacco Use  . Smoking status: Never Smoker  . Smokeless tobacco: Never Used  Vaping Use  . Vaping Use: Never used  Substance and Sexual Activity  . Alcohol use: No  . Drug use: No  . Sexual activity: Not on file  Other Topics Concern  . Not on file  Social History Narrative  . Not on file   Social Determinants of Health   Financial Resource Strain:   . Difficulty of Paying Living Expenses:   Food Insecurity:   . Worried About Charity fundraiser in the Last Year:   . [place] in the Last Year:  Transportation Needs:   . Film/video editor (Medical):   Marland Kitchen Lack of Transportation (Non-Medical):   Physical Activity:   . Days of Exercise per Week:   . Minutes of Exercise per Session:   Stress:   . Feeling of Stress :   Social Connections:   . Frequency of Communication with Friends and Family:   . Frequency of Social Gatherings with Friends and Family:   . Attends Religious Services:   . Active Member of Clubs or Organizations:   . Attends Archivist Meetings:   Marland Kitchen Marital Status:   Intimate Partner Violence:   . Fear of Current or Ex-Partner:   . Emotionally Abused:   Marland Kitchen Physically Abused:   . Sexually Abused:     Outpatient Medications Prior to Visit  Medication Sig Dispense Refill  . ACCU-CHEK GUIDE test strip USE  TO CHECK BLOOD SUGAR TWICE DAILY 100 each 3  . anastrozole (ARIMIDEX) 1 MG tablet TAKE 1 TABLET(1 MG) BY MOUTH DAILY 90 tablet 0  . aspirin EC 81 MG tablet Take 81 mg by mouth daily.    Marland Kitchen atorvastatin (LIPITOR) 20 MG tablet TAKE 1 TABLET (20 MG TOTAL) BY MOUTH DAILY AT 6 PM. 90 tablet 1  . Blood Glucose Monitoring Suppl (ONE TOUCH ULTRA SYSTEM KIT) w/Device KIT Use as directed. DX: E11.65. 1 each 0  . glimepiride (AMARYL) 4 MG tablet TAKE 1 TABLET (4 MG TOTAL) BY MOUTH EVERY MORNING. 90 tablet 1  . Glucosamine-Chondroitin (OSTEO BI-FLEX REGULAR STRENGTH PO) Take 1 tablet by mouth daily as needed (For joint health.). Reported on 08/17/2015    . latanoprost (XALATAN) 0.005 % ophthalmic solution Place 1 drop into both eyes at bedtime.  4  . losartan-hydrochlorothiazide (HYZAAR) 50-12.5 MG tablet TAKE 1 TABLET BY MOUTH EVERY DAY 90 tablet 1  . metFORMIN (GLUCOPHAGE-XR) 500 MG 24 hr tablet Take one twice daily with a meal. 180 tablet 0  . ONE TOUCH LANCETS MISC Check Blood Sugars twice per day. DX: E11.65 100 each 5  . timolol (TIMOPTIC) 0.5 % ophthalmic solution INSTILL 1 DROP INTO BOTH EYES EVERY DAY     No facility-administered medications prior to visit.    Allergies  Allergen Reactions  . Percocet [Oxycodone-Acetaminophen] Nausea And Vomiting  . Acetaminophen Nausea And Vomiting and Other (See Comments)    Allergic to Tylenol #3  . Actos [Pioglitazone Hydrochloride] Hives  . Latex Swelling and Other (See Comments)    Lip swelling  . Lisinopril Cough  . Shrimp [Shellfish Allergy] Itching  . Zocor [Simvastatin - High Dose] Itching  . Penicillins Rash and Other (See Comments)    Has patient had a PCN reaction causing immediate rash, facial/tongue/throat swelling, SOB or lightheadedness with hypotension: yes Has patient had a PCN reaction causing severe rash involving mucus membranes or skin necrosis: no Has patient had a PCN reaction that required hospitalization no Has patient had a PCN  reaction occurring within the last 10 years: no If all of the above answers are "NO", then may proceed with Cephalosporin use.     ROS Review of Systems  Constitutional: Negative for appetite change, chills, fatigue and unexpected weight change.  Eyes: Negative for visual disturbance.  Respiratory: Negative for cough, chest tightness, shortness of breath and wheezing.   Cardiovascular: Negative for chest pain, palpitations and leg swelling.  Neurological: Negative for dizziness, seizures, syncope, weakness, light-headedness and headaches.      Objective:    Physical Exam Constitutional:  Appearance: She is well-developed.  Eyes:     Pupils: Pupils are equal, round, and reactive to light.  Neck:     Thyroid: No thyromegaly.     Vascular: No JVD.  Cardiovascular:     Rate and Rhythm: Normal rate and regular rhythm.     Heart sounds: No gallop.   Pulmonary:     Effort: Pulmonary effort is normal. No respiratory distress.     Breath sounds: Normal breath sounds. No wheezing or rales.  Musculoskeletal:     Cervical back: Neck supple.  Skin:    Comments: She has approximately 8 mm slightly raised brownish skin lesion.  Along the superior portion there is a small amount of crusting  Neurological:     Mental Status: She is alert.     BP 130/62 (BP Location: Left Arm, Patient Position: Sitting, Cuff Size: Normal)   Pulse 84   Temp 97.6 F (36.4 C) (Temporal)   Wt 177 lb 11.2 oz (80.6 kg)   SpO2 97%   BMI 33.58 kg/m  Wt Readings from Last 3 Encounters:  08/30/19 177 lb 11.2 oz (80.6 kg)  02/27/19 178 lb 3.2 oz (80.8 kg)  08/27/18 177 lb 1.6 oz (80.3 kg)     Health Maintenance Due  Topic Date Due  . COVID-19 Vaccine (1) Never done    There are no preventive care reminders to display for this patient.  Lab Results  Component Value Date   TSH 4.19 05/19/2014   Lab Results  Component Value Date   WBC 5.0 07/31/2015   HGB 11.5 (L) 07/31/2015   HCT 35.6 (L)  07/31/2015   MCV 93.9 07/31/2015   PLT 178 07/31/2015   Lab Results  Component Value Date   NA 138 02/27/2019   K 3.5 02/27/2019   CHLORIDE 107 06/10/2015   CO2 30 02/27/2019   GLUCOSE 173 (H) 02/27/2019   BUN 11 02/27/2019   CREATININE 0.71 02/27/2019   BILITOT 1.0 02/27/2019   ALKPHOS 85 02/27/2019   AST 14 02/27/2019   ALT 11 02/27/2019   PROT 6.4 02/27/2019   ALBUMIN 4.0 02/27/2019   CALCIUM 9.2 02/27/2019   ANIONGAP 7 07/31/2015   EGFR 71 (L) 06/10/2015   GFR 95.13 02/27/2019   Lab Results  Component Value Date   CHOL 114 02/27/2019   Lab Results  Component Value Date   HDL 61.60 02/27/2019   Lab Results  Component Value Date   LDLCALC 38 02/27/2019   Lab Results  Component Value Date   TRIG 71.0 02/27/2019   Lab Results  Component Value Date   CHOLHDL 2 02/27/2019   Lab Results  Component Value Date   HGBA1C 6.7 (A) 08/30/2019      Assessment & Plan:   #1 type 2 diabetes slightly improved with A1c 6.7% -Plan routine follow-up in 6 months. -Continue positive dietary changes  #2 left knee pain.  Patient has known degenerative arthritis worse lateral compartment. -Continue regular exercise especially with quadricep strengthening -We discussed options such as steroid injection but this point she declines -She will consider topical Voltaren gel 3-4 times daily  #3 irritated seborrheic keratosis right upper anterior chest wall. -We discussed that this is a benign skin lesion.  We discussed options for treatment such as liquid nitrogen or shave but this point she would like to observe  No orders of the defined types were placed in this encounter.   Follow-up: Return in about 6 months (around 03/01/2020).  Carolann Littler, MD

## 2019-08-30 NOTE — Patient Instructions (Signed)
Consider trial of topical Voltaren gel to use for knee 3-4 times a day.    We could do steroid injection for temporary relief if not relieved with the above.    A1C is improved from 7.1% to current 6.7%

## 2019-09-12 ENCOUNTER — Other Ambulatory Visit: Payer: Self-pay | Admitting: Hematology and Oncology

## 2019-09-12 ENCOUNTER — Telehealth: Payer: Self-pay | Admitting: Hematology and Oncology

## 2019-09-12 NOTE — Telephone Encounter (Signed)
Scheduled f/u appointment per 7/22 provider message. Patient is aware of appointment date and time.

## 2019-09-19 ENCOUNTER — Other Ambulatory Visit: Payer: Self-pay | Admitting: Family Medicine

## 2019-10-13 NOTE — Progress Notes (Signed)
Patient Care Team: Eulas Post, MD as PCP - General (Family Medicine) Sylvan Cheese, NP as Nurse Practitioner (Hematology and Oncology)  DIAGNOSIS:    ICD-10-CM   1. Malignant neoplasm of upper-outer quadrant of right breast in female, estrogen receptor positive ([place])  C50.411    Z17.0     SUMMARY OF ONCOLOGIC HISTORY: Oncology History  Breast cancer of upper-outer quadrant of right female breast ([place])  06/02/2015 Initial Diagnosis   Right breast biopsy 10:00 position: Invasive lobular cancer with LCIS, ER 95%, PR 80%, Ki-67 15%, HER-2 negative duration 1.61, ultrasound revealed a 1.9 x 1 x 1.4 cm lesion, T1 cN0 stage IA clinical stage   07/09/2015 Surgery   Right lumpectomy: Invasive lobular cancer, grade 2, 1.9 cm, LCIS, ADH, lateral margin focally positive, 0/2 lymph nodes negative, ER 95%, PR 80%, Ki-67 15%, HER-2 negative, T1cN0 stage IA   07/31/2015 Surgery   Reresection surgical margins: Clear, Benign   09/01/2015 - 09/28/2015 Radiation Therapy   Adjuvant radiation therapy   10/22/2015 -  Anti-estrogen oral therapy   Anastrozole 1 mg daily     CHIEF COMPLIANT: Follow-up of right breast cancer on anastrozole therapy  INTERVAL HISTORY: [name] [name] is a 85 y.o. with above-mentioned history of right breast cancer treated with lumpectomy, radiation, and who is currently on anastrozole therapy. Mammogram on 08/19/19 showed no evidence of malignancy bilaterally. She presents to the clinic today for annual follow-up.  Her major complaint today is arthritic pain in her knee.  Otherwise she is tolerating anastrozole extremely well.  ALLERGIES:  is allergic to percocet [oxycodone-acetaminophen], acetaminophen, actos [pioglitazone hydrochloride], latex, lisinopril, shrimp [shellfish allergy], zocor [simvastatin - high dose], and penicillins.  MEDICATIONS:  Current Outpatient Medications  Medication Sig Dispense Refill  . ACCU-CHEK GUIDE test strip USE TO CHECK  BLOOD SUGAR TWICE DAILY 100 each 3  . anastrozole (ARIMIDEX) 1 MG tablet TAKE 1 TABLET BY MOUTH EVERY DAY 90 tablet 0  . aspirin EC 81 MG tablet Take 81 mg by mouth daily.    Marland Kitchen atorvastatin (LIPITOR) 20 MG tablet TAKE 1 TABLET (20 MG TOTAL) BY MOUTH DAILY AT 6 PM. 90 tablet 1  . Blood Glucose Monitoring Suppl (ONE TOUCH ULTRA SYSTEM KIT) w/Device KIT Use as directed. DX: E11.65. 1 each 0  . glimepiride (AMARYL) 4 MG tablet TAKE 1 TABLET (4 MG TOTAL) BY MOUTH EVERY MORNING. 90 tablet 1  . Glucosamine-Chondroitin (OSTEO BI-FLEX REGULAR STRENGTH PO) Take 1 tablet by mouth daily as needed (For joint health.). Reported on 08/17/2015    . latanoprost (XALATAN) 0.005 % ophthalmic solution Place 1 drop into both eyes at bedtime.  4  . losartan-hydrochlorothiazide (HYZAAR) 50-12.5 MG tablet TAKE 1 TABLET BY MOUTH EVERY DAY 90 tablet 1  . metFORMIN (GLUCOPHAGE-XR) 500 MG 24 hr tablet Take one twice daily with a meal. 180 tablet 0  . ONE TOUCH LANCETS MISC Check Blood Sugars twice per day. DX: E11.65 100 each 5  . timolol (TIMOPTIC) 0.5 % ophthalmic solution INSTILL 1 DROP INTO BOTH EYES EVERY DAY     No current facility-administered medications for this visit.    PHYSICAL EXAMINATION: ECOG PERFORMANCE STATUS: 1 - Symptomatic but completely ambulatory  Vitals:   10/14/19 1051  BP: (!) 123/58  Pulse: 81  Resp: 17  Temp: 97.9 F (36.6 C)  SpO2: 97%   Filed Weights   10/14/19 1051  Weight: 175 lb 1.6 oz (79.4 kg)    BREAST: No palpable masses or  nodules in either right or left breasts. No palpable axillary supraclavicular or infraclavicular adenopathy no breast tenderness or nipple discharge.  Scar tissue was palpated from prior surgery.  (exam performed in the presence of a chaperone)  LABORATORY DATA:  I have reviewed the data as listed CMP Latest Ref Rng & Units 02/27/2019 02/26/2018 05/15/2017  Glucose 70 - 99 mg/dL 173(H) 128(H) 203(H)  BUN 6 - 23 mg/dL '11 10 8  ' Creatinine 0.40 - 1.20 mg/dL  0.71 0.71 0.62  Sodium 135 - 145 mEq/L 138 139 138  Potassium 3.5 - 5.1 mEq/L 3.5 3.6 3.5  Chloride 96 - 112 mEq/L 103 102 102  CO2 19 - 32 mEq/L '30 30 29  ' Calcium 8.4 - 10.5 mg/dL 9.2 9.5 9.1  Total Protein 6.0 - 8.3 g/dL 6.4 6.4 6.6  Total Bilirubin 0.2 - 1.2 mg/dL 1.0 1.0 1.2  Alkaline Phos 39 - 117 U/L 85 88 91  AST 0 - 37 U/L '14 14 13  ' ALT 0 - 35 U/L '11 11 13    ' Lab Results  Component Value Date   WBC 5.0 07/31/2015   HGB 11.5 (L) 07/31/2015   HCT 35.6 (L) 07/31/2015   MCV 93.9 07/31/2015   PLT 178 07/31/2015   NEUTROABS 4.0 06/10/2015    ASSESSMENT & PLAN:  Breast cancer of upper-outer quadrant of right female breast ([place]) Right lumpectomy 07/09/2015: Invasive lobular cancer, grade 2, 1.9 cm, LCIS, ADH, lateral margin focally positive, 0/2 lymph nodes negative, ER 95%, PR 80%, Ki-67 15%, HER-2 negative, T1cN0 stage IA  Adjuvant radiation therapy 09/01/2015 to 09/28/2015  Treatment plan: Adjuvant antiestrogen therapy with anastrozole 1 mg by mouth daily 5 years  Anastrozole Toxicities: 1. Occasional hot flashes 2. Myalgias/arthralgias   Breast cancer surveillance: 1.Breast exam 10/14/2019: Benign 2.Mammogram 08/19/2019: Benign, breast density category B 3.Bone density 08/02/2016: T score -0.9: Normal  Mild breast lymphedema: Much improved.  I gave her a prescription for fitted bras  RTC in1 yearfor follow up.    No orders of the defined types were placed in this encounter.  The patient has a good understanding of the overall plan. she agrees with it. she will call with any problems that may develop before the next visit here.  Total time spent: 20 mins including face to face time and time spent for planning, charting and coordination of care  Nicholas Lose, MD 10/14/2019  I, Cloyde Reams Dorshimer, am acting as scribe for Dr. Nicholas Lose.  I have reviewed the above documentation for accuracy and completeness, and I agree with the above.

## 2019-10-14 ENCOUNTER — Other Ambulatory Visit: Payer: Self-pay

## 2019-10-14 ENCOUNTER — Inpatient Hospital Stay: Payer: 59 | Attending: Hematology and Oncology | Admitting: Hematology and Oncology

## 2019-10-14 DIAGNOSIS — Z79899 Other long term (current) drug therapy: Secondary | ICD-10-CM | POA: Diagnosis not present

## 2019-10-14 DIAGNOSIS — C50411 Malignant neoplasm of upper-outer quadrant of right female breast: Secondary | ICD-10-CM | POA: Diagnosis not present

## 2019-10-14 DIAGNOSIS — Z79811 Long term (current) use of aromatase inhibitors: Secondary | ICD-10-CM | POA: Insufficient documentation

## 2019-10-14 DIAGNOSIS — Z923 Personal history of irradiation: Secondary | ICD-10-CM | POA: Diagnosis not present

## 2019-10-14 DIAGNOSIS — Z170 Estrogen receptor positive status [ER+]: Secondary | ICD-10-CM

## 2019-10-14 MED ORDER — ANASTROZOLE 1 MG PO TABS: ORAL_TABLET | ORAL | 3 refills | Status: DC

## 2019-10-14 NOTE — Assessment & Plan Note (Signed)
Right lumpectomy 07/09/2015: Invasive lobular cancer, grade 2, 1.9 cm, LCIS, ADH, lateral margin focally positive, 0/2 lymph nodes negative, ER 95%, PR 80%, Ki-67 15%, HER-2 negative, T1cN0 stage IA  Adjuvant radiation therapy 09/01/2015 to 09/28/2015  Treatment plan: Adjuvant antiestrogen therapy with anastrozole 1 mg by mouth daily 5 years  Anastrozole Toxicities: 1. Occasional hot flashes 2. Myalgias/arthralgias   Breast cancer surveillance: 1.Breast exam 10/14/2019: Benign 2.Mammogram 08/19/2019: Benign, breast density category B 3.Bone density 08/02/2016: T score -0.9: Normal  Mild breast lymphedema: I encouraged her to continue with the massages to help improve the drainage of the breasts.  RTC in1 yearfor follow up.

## 2019-12-08 ENCOUNTER — Other Ambulatory Visit: Payer: Self-pay | Admitting: Family Medicine

## 2020-02-28 ENCOUNTER — Other Ambulatory Visit: Payer: Self-pay

## 2020-03-02 ENCOUNTER — Other Ambulatory Visit: Payer: Self-pay

## 2020-03-02 ENCOUNTER — Ambulatory Visit: Payer: 59 | Admitting: Family Medicine

## 2020-03-02 ENCOUNTER — Encounter: Payer: Self-pay | Admitting: Family Medicine

## 2020-03-02 VITALS — BP 128/70 | HR 78 | Ht 61.0 in | Wt 174.0 lb

## 2020-03-02 DIAGNOSIS — M1712 Unilateral primary osteoarthritis, left knee: Secondary | ICD-10-CM

## 2020-03-02 DIAGNOSIS — E1165 Type 2 diabetes mellitus with hyperglycemia: Secondary | ICD-10-CM | POA: Diagnosis not present

## 2020-03-02 DIAGNOSIS — I10 Essential (primary) hypertension: Secondary | ICD-10-CM

## 2020-03-02 DIAGNOSIS — E783 Hyperchylomicronemia: Secondary | ICD-10-CM | POA: Diagnosis not present

## 2020-03-02 LAB — BASIC METABOLIC PANEL
BUN: 10 mg/dL (ref 6–23)
CO2: 31 mEq/L (ref 19–32)
Calcium: 9.2 mg/dL (ref 8.4–10.5)
Chloride: 104 mEq/L (ref 96–112)
Creatinine, Ser: 0.67 mg/dL (ref 0.40–1.20)
GFR: 80.5 mL/min (ref >60.00)
Glucose, Bld: 157 mg/dL — ABNORMAL HIGH (ref 70–99)
Potassium: 3.7 mEq/L (ref 3.5–5.1)
Sodium: 139 mEq/L (ref 135–145)

## 2020-03-02 LAB — LIPID PANEL
Cholesterol: 111 mg/dL (ref 0–200)
HDL: 63.2 mg/dL (ref >39.00)
LDL Cholesterol: 37 mg/dL (ref 0–99)
NonHDL: 47.36
Total CHOL/HDL Ratio: 2
Triglycerides: 53 mg/dL (ref 0.0–149.0)
VLDL: 10.6 mg/dL (ref 0.0–40.0)

## 2020-03-02 LAB — HEPATIC FUNCTION PANEL
ALT: 11 U/L (ref 0–35)
AST: 14 U/L (ref 0–37)
Albumin: 4 g/dL (ref 3.5–5.2)
Alkaline Phosphatase: 73 U/L (ref 39–117)
Bilirubin, Direct: 0.3 mg/dL (ref 0.0–0.3)
Total Bilirubin: 1.1 mg/dL (ref 0.2–1.2)
Total Protein: 6.4 g/dL (ref 6.0–8.3)

## 2020-03-02 LAB — POCT GLYCOSYLATED HEMOGLOBIN (HGB A1C): Hemoglobin A1C: 6.7 % — AB (ref 4.0–5.6)

## 2020-03-02 LAB — HEMOGLOBIN A1C: Hgb A1c MFr Bld: 6.9 % — ABNORMAL HIGH (ref 4.6–6.5)

## 2020-03-02 NOTE — Progress Notes (Signed)
Subjective:   I, [name] [name], LAT, ATC acting as a scribe for [name] Leader, MD.  I'm seeing this patient as a consultation for Dr. Carolann [name]. Note will be routed back to referring provider/PCP.  CC: Chronic left knee pain  HPI: Pt is a 86yo female presenting w/ chronic L knee pain x several years.  She locates pain to her L anterior-lateral aspect of knee. Pain has been worsening over the last few months.  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  She put a lot of miles on her knees and thinks that may be why her knees are hurting.  She denies any injury recently.  She is tried some over-the-counter topical medicine for her knee pain which is helped a little.  She cannot remember what specific medicine it was.  L knee swelling: previous L knee mechanical sympoms: yes Aggravates: going from sitting to standing Rx tried: creams, heat, cold, brace  Diagnostic testing: L knee XR- 05/18/16  Past medical history, Surgical history, Family history, Social history, Allergies, and medications have been entered into the medical record, reviewed.   Review of Systems: No new headache, visual changes, nausea, vomiting, diarrhea, constipation, dizziness, abdominal pain, skin rash, fevers, chills, night sweats, weight loss, swollen lymph nodes, body aches, joint swelling, muscle aches, chest pain, shortness of breath, mood changes, visual or auditory hallucinations.   Objective:    Vitals:   03/03/20 1336  BP: (!) 142/88  Pulse: 79  SpO2: 95%   General: Well Developed, well nourished, and in no acute distress.  Neuro/Psych: Alert and oriented x3, extra-ocular muscles intact, able to move all 4 extremities, sensation grossly intact. Skin: Warm and dry, no rashes noted.  Respiratory: Not using accessory muscles, speaking in full sentences, trachea midline.  Cardiovascular: Pulses palpable, no extremity edema. Abdomen: Does not appear distended. MSK:  Left knee moderate effusion.  Genu valgus present. Normal motion with crepitation. Tender palpation medial and lateral joint line worse lateral. Slight laxity with LCL stress testing without pain. Mild limited extension strength and flexion strength.   Lab and Radiology Results  X-ray images left knee obtained today personally and independently interpreted Severe lateral joint line DJD.  Moderate patellofemoral DJD.  Moderate medial compartment DJD. Await formal radiology review  Procedure: Real-time Ultrasound Guided Injection of left knee superior lateral patellar space Device: Philips Affiniti 50G Images permanently stored and available for review in PACS Ultrasound examination prior to injection reveals significant medial and lateral DJD.  Moderate effusion.  Small Baker's cyst. Verbal informed consent obtained.  Discussed risks and benefits of procedure. Warned about infection bleeding damage to structures skin hypopigmentation and fat atrophy among others. Patient expresses understanding and agreement Time-out conducted.   Noted no overlying erythema, induration, or other signs of local infection.   Skin prepped in a sterile fashion.   Local anesthesia: Topical Ethyl chloride.   With sterile technique and under real time ultrasound guidance:  40 mg of Kenalog and 88ml of Marcaine injected into the joint. Fluid seen entering the joint capsule.   Completed without difficulty   Pain immediately resolved suggesting accurate placement of the medication.   Advised to call if fevers/chills, erythema, induration, drainage, or persistent bleeding.   Images permanently stored and available for review in the ultrasound unit.  Impression: Technically successful ultrasound guided injection.       Impression and Recommendations:    Assessment and Plan: 86 y.o. female  with left knee pain.  Patient had x-ray 2018 which showed significant knee arthritis.  I think her pain today is likely an  exacerbation of her chronic knee degenerative changes.  Discussed options.  Ultimately she will require total knee replacement.  For today plan for steroid injection, Voltaren gel.  Recheck back if not improving.  Could consider ironic acid injection.  PDMP not reviewed this encounter. Orders Placed This Encounter  Procedures  . [place] LIMITED JOINT SPACE STRUCTURES LOW LEFT(NO LINKED CHARGES)    Standing Status:   Future    Number of Occurrences:   1    Standing Expiration Date:   08/31/2020    Order Specific Question:   Reason for Exam (SYMPTOM  OR DIAGNOSIS REQUIRED)    Answer:   chronic left knee pain    Order Specific Question:   Preferred imaging location?    Answer:   [facility]  . DG Knee AP/LAT W/[place] Left    Standing Status:   Future    Number of Occurrences:   1    Standing Expiration Date:   03/03/2021    Order Specific Question:   Reason for Exam (SYMPTOM  OR DIAGNOSIS REQUIRED)    Answer:   chronic left knee pain    Order Specific Question:   Preferred imaging location?    Answer:   [facility]   No orders of the defined types were placed in this encounter.   Discussed warning signs or symptoms. Please see discharge instructions. Patient expresses understanding.   The above documentation has been reviewed and is accurate and complete [name] [name], M.D.

## 2020-03-02 NOTE — Addendum Note (Signed)
Addended by: Tessie Fass D on: 03/02/2020 11:03 AM   Modules accepted: Orders

## 2020-03-02 NOTE — Patient Instructions (Signed)
Osteoarthritis  Osteoarthritis is a type of arthritis. It refers to joint pain or joint disease. Osteoarthritis affects tissue that covers the ends of bones in joints (cartilage). Cartilage acts as a cushion between the bones and helps them move smoothly. Osteoarthritis occurs when cartilage in the joints gets worn down. Osteoarthritis is sometimes called "wear and tear" arthritis. Osteoarthritis is the most common form of arthritis. It often occurs in older people. It is a condition that gets worse over time. The joints most often affected by this condition are in the fingers, toes, hips, knees, and spine, including the neck and lower back. What are the causes? This condition is caused by the wearing down of cartilage that covers the ends of bones. What increases the risk? The following factors may make you more likely to develop this condition:  Being age 50 or older.  Obesity.  Overuse of joints.  Past injury of a joint.  Past surgery on a joint.  Family history of osteoarthritis. What are the signs or symptoms? The main symptoms of this condition are pain, swelling, and stiffness in the joint. Other symptoms may include:  An enlarged joint.  More pain and further damage caused by small pieces of bone or cartilage that break off and float inside of the joint.  Small deposits of bone (osteophytes) that grow on the edges of the joint.  A grating or scraping feeling inside the joint when you move it.  Popping or creaking sounds when you move.  Difficulty walking or exercising.  An inability to grip items, twist your hand(s), or control the movements of your hands and fingers. How is this diagnosed? This condition may be diagnosed based on:  Your medical history.  A physical exam.  Your symptoms.  X-rays of the affected joint(s).  Blood tests to rule out other types of arthritis. How is this treated? There is no cure for this condition, but treatment can help control  pain and improve joint function. Treatment may include a combination of therapies, such as:  Pain relief techniques, such as: ? Applying heat and cold to the joint. ? Massage. ? A form of talk therapy called cognitive behavioral therapy (CBT). This therapy helps you set goals and follow up on the changes that you make.  Medicines for pain and inflammation. The medicines can be taken by mouth or applied to the skin. They include: ? NSAIDs, such as ibuprofen. ? Prescription medicines. ? Strong anti-inflammatory medicines (corticosteroids). ? Certain nutritional supplements.  A prescribed exercise program. You may work with a physical therapist.  Assistive devices, such as a brace, wrap, splint, specialized glove, or cane.  A weight control plan.  Surgery, such as: ? An osteotomy. This is done to reposition the bones and relieve pain or to remove loose pieces of bone and cartilage. ? Joint replacement surgery. You may need this surgery if you have advanced osteoarthritis. Follow these instructions at home: Activity  Rest your affected joints as told by your health care provider.  Exercise as told by your health care provider. He or she may recommend specific types of exercise, such as: ? Strengthening exercises. These are done to strengthen the muscles that support joints affected by arthritis. ? Aerobic activities. These are exercises, such as brisk walking or water aerobics, that increase your heart rate. ? Range-of-motion activities. These help your joints move more easily. ? Balance and agility exercises. Managing pain, stiffness, and swelling  If directed, apply heat to the affected area as often   as told by your health care provider. Use the heat source that your health care provider recommends, such as a moist heat pack or a heating pad. ? If you have a removable assistive device, remove it as told by your health care provider. ? Place a towel between your skin and the heat  source. If your health care provider tells you to keep the assistive device on while you apply heat, place a towel between the assistive device and the heat source. ? Leave the heat on for 20-30 minutes. ? Remove the heat if your skin turns bright red. This is especially important if you are unable to feel pain, heat, or cold. You may have a greater risk of getting burned.  If directed, put ice on the affected area. To do this: ? If you have a removable assistive device, remove it as told by your health care provider. ? Put ice in a plastic bag. ? Place a towel between your skin and the bag. If your health care provider tells you to keep the assistive device on during icing, place a towel between the assistive device and the bag. ? Leave the ice on for 20 minutes, 2-3 times a day. ? Move your fingers or toes often to reduce stiffness and swelling. ? Raise (elevate) the injured area above the level of your heart while you are sitting or lying down.      General instructions  Take over-the-counter and prescription medicines only as told by your health care provider.  Maintain a healthy weight. Follow instructions from your health care provider for weight control.  Do not use any products that contain nicotine or tobacco, such as cigarettes, e-cigarettes, and chewing tobacco. If you need help quitting, ask your health care provider.  Use assistive devices as told by your health care provider.  Keep all follow-up visits as told by your health care provider. This is important. Where to find more information  National Institute of Arthritis and Musculoskeletal and Skin Diseases: www.niams.nih.gov  National Institute on Aging: www.nia.nih.gov  American College of Rheumatology: www.rheumatology.org Contact a health care provider if:  You have redness, swelling, or a feeling of warmth in a joint that gets worse.  You have a fever along with joint or muscle aches.  You develop a  rash.  You have trouble doing your normal activities. Get help right away if:  You have pain that gets worse and is not relieved by pain medicine. Summary  Osteoarthritis is a type of arthritis that affects tissue covering the ends of bones in joints (cartilage).  This condition is caused by the wearing down of cartilage that covers the ends of bones.  The main symptom of this condition is pain, swelling, and stiffness in the joint.  There is no cure for this condition, but treatment can help control pain and improve joint function. This information is not intended to replace advice given to you by your health care provider. Make sure you discuss any questions you have with your health care provider. Document Revised: 02/04/2019 Document Reviewed: 02/04/2019 Elsevier Patient Education  2021 Elsevier Inc.  

## 2020-03-02 NOTE — Progress Notes (Signed)
Established Patient Office Visit  Subjective:  Patient ID: [name] [name], female    DOB: 1934/09/04  Age: 86 y.o. MRN: [id number]  CC:  Chief Complaint  Patient presents with  . Diabetes    HPI [name] [name] presents for medical follow-up.  She has hypertension, type 2 diabetes, osteoarthritis, past history of kidney stones, glaucoma, hyperlipidemia, past history of breast cancer.  She had some progressive left knee pains.  Had x-rays back in 2018 which showed osteoarthritis lateral compartment greater than right.  Her pain is mostly lateral knee.  No recent falls.  She does some exercise with home stationary cycle and some walking.  She has not had COVID-vaccine nor flu vaccine and she declines both.  Her diabetes has generally been well controlled..  She has had good renal function in the past.  She remains on glimepiride and metformin.  No recent hypoglycemic symptoms.  Her hypertension is treated with losartan HCTZ.  She takes atorvastatin for hyperlipidemia.  Due for follow-up labs.  Past Medical History:  Diagnosis Date  . Arthritis   . Breast cancer ([place]) 2017   Right Breast Cancer  . Breast cancer of upper-outer quadrant of right female breast ([place]) 06/04/2015  . Chronic kidney disease    stones  . Diabetes mellitus   . Hyperlipidemia   . Hypertension   . Personal history of radiation therapy 2017   Right Breast Cancer  . PONV (postoperative nausea and vomiting)   . UTI (urinary tract infection)     Past Surgical History:  Procedure Laterality Date  . ABDOMINAL HYSTERECTOMY  1980  . BREAST BIOPSY Right 2018/6   benign  . BREAST LUMPECTOMY Right 2017  . CATARACT EXTRACTION    . CHOLECYSTECTOMY  1989  . COLONOSCOPY    . RADIOACTIVE SEED GUIDED PARTIAL MASTECTOMY WITH AXILLARY SENTINEL LYMPH NODE BIOPSY Right 07/09/2015   Procedure: RADIOACTIVE SEED GUIDED PARTIAL MASTECTOMY WITH AXILLARY SENTINEL LYMPH NODE BIOPSY;  Surgeon: Erroll Luna, MD;  Location: [facility];  Service: General;  Laterality: Right;  . RE-EXCISION OF BREAST LUMPECTOMY Right 07/31/2015   Procedure: RE-EXCISION OF RIGHT BREAST LUMPECTOMY;  Surgeon: Erroll Luna, MD;  Location: [facility];  Service: General;  Laterality: Right;  . TONSILLECTOMY  1953    Family History  Problem Relation Age of Onset  . Cancer Mother        uterine  . Heart disease Father 50  . Diabetes Sister   . Diabetes Brother     Social History   Socioeconomic History  . Marital status: Widowed    Spouse name: Not on file  . Number of children: Not on file  . Years of education: Not on file  . Highest education level: Not on file  Occupational History  . Not on file  Tobacco Use  . Smoking status: Never Smoker  . Smokeless tobacco: Never Used  Vaping Use  . Vaping Use: Never used  Substance and Sexual Activity  . Alcohol use: No  . Drug use: No  . Sexual activity: Not on file  Other Topics Concern  . Not on file  Social History Narrative  . Not on file   Social Determinants of Health   Financial Resource Strain: Not on file  Food Insecurity: Not on file  Transportation Needs: Not on file  Physical Activity: Not on file  Stress: Not on file  Social Connections: Not on file  Intimate Partner Violence: Not on file  Outpatient Medications Prior to Visit  Medication Sig Dispense Refill  . ACCU-CHEK GUIDE test strip USE TO CHECK BLOOD SUGAR TWICE DAILY 100 each 3  . anastrozole (ARIMIDEX) 1 MG tablet TAKE 1 TABLET BY MOUTH EVERY DAY 90 tablet 3  . aspirin EC 81 MG tablet Take 81 mg by mouth daily.    Marland Kitchen atorvastatin (LIPITOR) 20 MG tablet TAKE 1 TABLET (20 MG TOTAL) BY MOUTH DAILY AT 6 PM. 90 tablet 1  . Blood Glucose Monitoring Suppl (ONE TOUCH ULTRA SYSTEM KIT) w/Device KIT Use as directed. DX: E11.65. 1 each 0  . glimepiride (AMARYL) 4 MG tablet TAKE 1 TABLET (4 MG TOTAL) BY MOUTH EVERY MORNING. 90 tablet 1  . Glucosamine-Chondroitin (OSTEO BI-FLEX REGULAR STRENGTH PO)  Take 1 tablet by mouth daily as needed (For joint health.). Reported on 08/17/2015    . latanoprost (XALATAN) 0.005 % ophthalmic solution Place 1 drop into both eyes at bedtime.  4  . losartan-hydrochlorothiazide (HYZAAR) 50-12.5 MG tablet TAKE 1 TABLET BY MOUTH EVERY DAY 90 tablet 1  . metFORMIN (GLUCOPHAGE-XR) 500 MG 24 hr tablet Take one twice daily with a meal. 180 tablet 0  . ONE TOUCH LANCETS MISC Check Blood Sugars twice per day. DX: E11.65 100 each 5  . timolol (TIMOPTIC) 0.5 % ophthalmic solution INSTILL 1 DROP INTO BOTH EYES EVERY DAY     No facility-administered medications prior to visit.    Allergies  Allergen Reactions  . Percocet [Oxycodone-Acetaminophen] Nausea And Vomiting  . Acetaminophen Nausea And Vomiting and Other (See Comments)    Allergic to Tylenol #3  . Actos [Pioglitazone Hydrochloride] Hives  . Latex Swelling and Other (See Comments)    Lip swelling  . Lisinopril Cough  . Shrimp [Shellfish Allergy] Itching  . Zocor [Simvastatin - High Dose] Itching  . Penicillins Rash and Other (See Comments)    Has patient had a PCN reaction causing immediate rash, facial/tongue/throat swelling, SOB or lightheadedness with hypotension: yes Has patient had a PCN reaction causing severe rash involving mucus membranes or skin necrosis: no Has patient had a PCN reaction that required hospitalization no Has patient had a PCN reaction occurring within the last 10 years: no If all of the above answers are "NO", then may proceed with Cephalosporin use.     ROS Review of Systems  Constitutional: Negative for fatigue and unexpected weight change.  Eyes: Negative for visual disturbance.  Respiratory: Negative for cough, chest tightness, shortness of breath and wheezing.   Cardiovascular: Negative for chest pain, palpitations and leg swelling.  Endocrine: Negative for polydipsia and polyuria.  Musculoskeletal: Positive for arthralgias.  Neurological: Negative for dizziness,  seizures, syncope, weakness, light-headedness and headaches.      Objective:    Physical Exam Vitals reviewed.  Constitutional:      Appearance: Normal appearance.  Cardiovascular:     Rate and Rhythm: Normal rate and regular rhythm.  Pulmonary:     Effort: Pulmonary effort is normal.     Breath sounds: Normal breath sounds.  Skin:    Comments: Feet reveal no skin lesions. Good distal foot pulses. Good capillary refill. No calluses. Normal sensation with monofilament testing   Neurological:     Mental Status: She is alert.     BP 128/70   Pulse 78   Ht _0  (1.549 m)   Wt 174 lb (78.9 kg)   SpO2 94%   BMI 32.88 kg/m  Wt Readings from Last 3 Encounters:  03/02/20 174 lb (  78.9 kg)  10/14/19 175 lb 1.6 oz (79.4 kg)  08/30/19 177 lb 11.2 oz (80.6 kg)     There are no preventive care reminders to display for this patient.  There are no preventive care reminders to display for this patient.  Lab Results  Component Value Date   TSH 4.19 05/19/2014   Lab Results  Component Value Date   WBC 5.0 07/31/2015   HGB 11.5 (L) 07/31/2015   HCT 35.6 (L) 07/31/2015   MCV 93.9 07/31/2015   PLT 178 07/31/2015   Lab Results  Component Value Date   NA 138 02/27/2019   K 3.5 02/27/2019   CHLORIDE 107 06/10/2015   CO2 30 02/27/2019   GLUCOSE 173 (H) 02/27/2019   BUN 11 02/27/2019   CREATININE 0.71 02/27/2019   BILITOT 1.0 02/27/2019   ALKPHOS 85 02/27/2019   AST 14 02/27/2019   ALT 11 02/27/2019   PROT 6.4 02/27/2019   ALBUMIN 4.0 02/27/2019   CALCIUM 9.2 02/27/2019   ANIONGAP 7 07/31/2015   EGFR 71 (L) 06/10/2015   GFR 95.13 02/27/2019   Lab Results  Component Value Date   CHOL 114 02/27/2019   Lab Results  Component Value Date   HDL 61.60 02/27/2019   Lab Results  Component Value Date   LDLCALC 38 02/27/2019   Lab Results  Component Value Date   TRIG 71.0 02/27/2019   Lab Results  Component Value Date   CHOLHDL 2 02/27/2019   Lab Results   Component Value Date   HGBA1C 6.7 (A) 03/02/2020      Assessment & Plan:   Problem List Items Addressed This Visit      Unprioritized   Hyperlipidemia   Relevant Orders   Lipid panel   Hepatic function panel   Hypertension   Relevant Orders   Basic metabolic panel   Primary osteoarthritis of left knee   Relevant Orders   Ambulatory referral to Sports Medicine   Type 2 diabetes mellitus, uncontrolled ([place]) - Primary   Relevant Orders   POCT glycosylated hemoglobin (Hb A1C) (Completed)   Hemoglobin A1c    -Check further labs today as above -Set up sports medicine referral regarding her progressive left knee pain.  She would like to explore nonsurgical options further -Continue regular quadricep strengthening such as stationary cycle -We have encouraged her to consider flu vaccine and COVID-vaccine but she declines both -We will plan routine follow-up in 6 months and sooner as needed  No orders of the defined types were placed in this encounter.   Follow-up: Return in about 6 months (around 08/30/2020).    Carolann Littler, MD

## 2020-03-03 ENCOUNTER — Ambulatory Visit (INDEPENDENT_AMBULATORY_CARE_PROVIDER_SITE_OTHER): Payer: 59

## 2020-03-03 ENCOUNTER — Ambulatory Visit: Payer: 59 | Admitting: Family Medicine

## 2020-03-03 ENCOUNTER — Ambulatory Visit: Payer: Self-pay

## 2020-03-03 ENCOUNTER — Other Ambulatory Visit: Payer: Self-pay

## 2020-03-03 VITALS — BP 142/88 | HR 79 | Ht 61.0 in | Wt 175.4 lb

## 2020-03-03 DIAGNOSIS — M1712 Unilateral primary osteoarthritis, left knee: Secondary | ICD-10-CM | POA: Diagnosis not present

## 2020-03-03 NOTE — Patient Instructions (Addendum)
Thank you for coming in today.  Please get an Xray today before you leave  Call or go to the ER if you develop a large red swollen joint with extreme pain or oozing puss.   Please use voltaren gel up to 4x daily for pain as needed.   Recheck if you are not doing well.

## 2020-03-03 NOTE — Progress Notes (Signed)
Mychart message sent: Labs all look good/stable.  No concerns.

## 2020-03-04 ENCOUNTER — Telehealth: Payer: Self-pay | Admitting: Family Medicine

## 2020-03-04 NOTE — Progress Notes (Signed)
Knee x-ray shows advanced arthritis

## 2020-03-04 NOTE — Telephone Encounter (Signed)
Spoke w/ pt and she reports that elbow rash is improving. Pt notes that she ate a bag of peanut M&M's last night and think's the rash may be related to that, because she doesn't typically consume peanuts due to her hemorrhoids. Pt verbalized understanding and will contact us if any additional issues arise.

## 2020-03-04 NOTE — Telephone Encounter (Signed)
Pt had a knee injection here yesterday. At about midnight both of her elbows developed a burning rash. It is a bit better today, she has taken benadryl.  She wanted to check with [place] to see if this is a possible reaction to the shot, as she does have frequent reactions to medicines.

## 2020-03-14 ENCOUNTER — Other Ambulatory Visit: Payer: Self-pay | Admitting: Family Medicine

## 2020-04-09 ENCOUNTER — Other Ambulatory Visit: Payer: Self-pay | Admitting: Family Medicine

## 2020-05-01 ENCOUNTER — Other Ambulatory Visit: Payer: Self-pay | Admitting: Family Medicine

## 2020-05-18 LAB — HM DIABETES EYE EXAM

## 2020-05-20 ENCOUNTER — Encounter: Payer: Self-pay | Admitting: Family Medicine

## 2020-07-28 ENCOUNTER — Other Ambulatory Visit: Payer: Self-pay | Admitting: Hematology and Oncology

## 2020-07-28 DIAGNOSIS — Z9889 Other specified postprocedural states: Secondary | ICD-10-CM

## 2020-08-01 ENCOUNTER — Other Ambulatory Visit: Payer: Self-pay | Admitting: Family Medicine

## 2020-08-17 NOTE — Progress Notes (Signed)
   I, Wendy Poet, LAT, ATC, am serving as scribe for Dr. Lynne Leader.  [name] [name] is a 86 y.o. female who presents to [place] at [facility] today for f/u of chronic L antero-lateral knee pain x several years.  She was last seen by Dr. Shelly Bombard on 03/03/20 and had a L knee injection.  She was also advised to use Voltaren gel.  Since her last visit, pt reports that the injection she had in January only helped for approximately 4 days.  She reports that her knee pain has essentially con't since January.  She denies any current swelling.  Aggravating factors include transitioning from sit-to-stand and her first few steps.  Diagnostic testing: L knee XR- 03/03/20   Pertinent review of systems: No fevers or chills  Relevant historical information: Diabetes.  History of breast cancer 2017   Exam:  BP 140/75 (BP Location: Left Arm, Patient Position: Sitting, Cuff Size: Normal)   Pulse 85   Ht 5\' 1"  (1.549 m)   Wt 175 lb 9.6 oz (79.7 kg)   SpO2 96%   BMI 33.18 kg/m  General: Well Developed, well nourished, and in no acute distress.   MSK: Left knee mild genu valgus. Nontender. Normal motion with crepitation. Intact strength.    Lab and Radiology Results  EXAM: LEFT KNEE 3 VIEWS   COMPARISON:  None.   FINDINGS: Advanced tricompartment degenerative changes with joint space narrowing and spurring. No significant joint effusion. No acute bony abnormality. Specifically, no fracture, subluxation, or dislocation.   IMPRESSION: Advanced degenerative changes.  No acute bony abnormality.     Electronically Signed   By: Rolm Baptise M.D.   On: 03/03/2020 21:23 I, Lynne Leader, personally (independently) visualized and performed the interpretation of the images attached in this note.      Assessment and Plan: 86 y.o. female with left knee pain due to advanced arthritis mostly in the lateral compartment.  Patient had a poor reaction to steroid injection in  January and is not a good candidate for further intra-articular steroid injections in the left knee.  We discussed her further treatment options.  Hyaluronic acid is an obvious next step although she is a little reluctant to consider more injections based on her poor response to prior injection.  Additionally total knee replacement is an obvious option as well.  Despite her age and other medical problems she is still in pretty good shape and probably would benefit from a knee replacement and her quality of life and independence.  Discussed options.  Recommend authorization hyaluronic acid injections and if her knee hurts enough proceed with them and if those fail then proceed with surgical consultation for knee replacement.    Discussed warning signs or symptoms. Please see discharge instructions. Patient expresses understanding.   The above documentation has been reviewed and is accurate and complete Lynne Leader, M.D. Total encounter time 20 minutes including face-to-face time with the patient and, reviewing past medical record, and charting on the date of service.   Treatment plan and options

## 2020-08-18 ENCOUNTER — Ambulatory Visit: Payer: Self-pay

## 2020-08-18 ENCOUNTER — Other Ambulatory Visit: Payer: Self-pay

## 2020-08-18 ENCOUNTER — Encounter: Payer: Self-pay | Admitting: Family Medicine

## 2020-08-18 ENCOUNTER — Ambulatory Visit: Payer: 59 | Admitting: Family Medicine

## 2020-08-18 VITALS — BP 140/75 | HR 85 | Ht 61.0 in | Wt 175.6 lb

## 2020-08-18 DIAGNOSIS — M1712 Unilateral primary osteoarthritis, left knee: Secondary | ICD-10-CM

## 2020-08-18 DIAGNOSIS — M25562 Pain in left knee: Secondary | ICD-10-CM | POA: Diagnosis not present

## 2020-08-18 DIAGNOSIS — G8929 Other chronic pain: Secondary | ICD-10-CM | POA: Diagnosis not present

## 2020-08-18 NOTE — Patient Instructions (Signed)
Thank you for coming in today.  I think your knee hurts because it is wearing out.   You probably should have a knee replacement.   We could try gel shots. I will get the process started to get authorization for this. Let me know if you would like them in the future.   Please use Voltaren gel (Generic Diclofenac Gel) up to 4x daily for pain as needed.  This is available over-the-counter as both the name brand Voltaren gel and the generic diclofenac gel.   Stay active and continue the exercise bike.   Eventually it may make sense to have surgery.

## 2020-08-19 ENCOUNTER — Ambulatory Visit
Admission: RE | Admit: 2020-08-19 | Discharge: 2020-08-19 | Disposition: A | Discharge status: Home / Self Care | Payer: 59 | Source: Ambulatory Visit | Attending: Hematology and Oncology | Admitting: Hematology and Oncology

## 2020-08-19 DIAGNOSIS — Z9889 Other specified postprocedural states: Secondary | ICD-10-CM

## 2020-08-31 ENCOUNTER — Encounter: Payer: Self-pay | Admitting: Family Medicine

## 2020-08-31 ENCOUNTER — Ambulatory Visit: Payer: 59 | Admitting: Family Medicine

## 2020-08-31 ENCOUNTER — Other Ambulatory Visit: Payer: Self-pay

## 2020-08-31 VITALS — BP 132/64 | HR 89 | Temp 98.2°F | Wt 172.8 lb

## 2020-08-31 DIAGNOSIS — I10 Essential (primary) hypertension: Secondary | ICD-10-CM | POA: Diagnosis not present

## 2020-08-31 DIAGNOSIS — E1165 Type 2 diabetes mellitus with hyperglycemia: Secondary | ICD-10-CM

## 2020-08-31 DIAGNOSIS — E783 Hyperchylomicronemia: Secondary | ICD-10-CM

## 2020-08-31 LAB — POCT GLYCOSYLATED HEMOGLOBIN (HGB A1C): Hemoglobin A1C: 6.8 % — AB (ref 4.0–5.6)

## 2020-08-31 NOTE — Progress Notes (Signed)
Established Patient Office Visit  Subjective:  Patient ID: [name] [name], female    DOB: [birth year]  Age: 86 y.o. MRN: [id number]  CC:  Chief Complaint  Patient presents with   Follow-up    diabetes    HPI [name] [name] presents for medical follow-up.  She has history of hypertension, type 2 diabetes, hyperlipidemia.  Home blood sugars have been stable.  No recent hypoglycemic episodes.  Medications reviewed and compliant with all.  She does still take aspirin.  No prior history of stroke or coronary disease.  She has had some recent arthritic issues especially in her left knee and back and is seeing orthopedics for that.  She has had some injections with minimal improvement.  Last eye exam was in March.  She has declined COVID-vaccine and also shingles vaccine.  She is fearful of side effects.  She has history of breast cancer and is still taking anastrozole.  Past Medical History:  Diagnosis Date   Arthritis    Breast cancer ([place]) 2017   Right Breast Cancer   Breast cancer of upper-outer quadrant of right female breast ([place]) 06/04/2015   Chronic kidney disease    stones   Diabetes mellitus    Hyperlipidemia    Hypertension    Personal history of radiation therapy 2017   Right Breast Cancer   PONV (postoperative nausea and vomiting)    UTI (urinary tract infection)     Past Surgical History:  Procedure Laterality Date   ABDOMINAL HYSTERECTOMY  1980   BREAST BIOPSY Right 2018/6   benign   BREAST LUMPECTOMY Right 2017   CATARACT EXTRACTION     CHOLECYSTECTOMY  1989   COLONOSCOPY     RADIOACTIVE SEED GUIDED PARTIAL MASTECTOMY WITH AXILLARY SENTINEL LYMPH NODE BIOPSY Right 07/09/2015   Procedure: RADIOACTIVE SEED GUIDED PARTIAL MASTECTOMY WITH AXILLARY SENTINEL LYMPH NODE BIOPSY;  Surgeon: Erroll Luna, MD;  Location: [facility];  Service: General;  Laterality: Right;   RE-EXCISION OF BREAST LUMPECTOMY Right 07/31/2015   Procedure: RE-EXCISION OF RIGHT  BREAST LUMPECTOMY;  Surgeon: Erroll Luna, MD;  Location: [facility];  Service: General;  Laterality: Right;   TONSILLECTOMY  1953    Family History  Problem Relation Age of Onset   Cancer Mother        uterine   Heart disease Father 62   Diabetes Sister    Diabetes Brother     Social History   Socioeconomic History   Marital status: Widowed    Spouse name: Not on file   Number of children: Not on file   Years of education: Not on file   Highest education level: Not on file  Occupational History   Not on file  Tobacco Use   Smoking status: Never   Smokeless tobacco: Never  Vaping Use   Vaping Use: Never used  Substance and Sexual Activity   Alcohol use: No   Drug use: No   Sexual activity: Not on file  Other Topics Concern   Not on file  Social History Narrative   Not on file   Social Determinants of Health   Financial Resource Strain: Not on file  Food Insecurity: Not on file  Transportation Needs: Not on file  Physical Activity: Not on file  Stress: Not on file  Social Connections: Not on file  Intimate Partner Violence: Not on file    Outpatient Medications Prior to Visit  Medication Sig Dispense Refill   ACCU-CHEK GUIDE test strip  USE TO CHECK BLOOD SUGAR TWICE DAILY 100 each 3   anastrozole (ARIMIDEX) 1 MG tablet TAKE 1 TABLET BY MOUTH EVERY DAY 90 tablet 3   atorvastatin (LIPITOR) 20 MG tablet TAKE 1 TABLET BY MOUTH DAILY AT 6 PM. 90 tablet 1   Blood Glucose Monitoring Suppl (ONE TOUCH ULTRA SYSTEM KIT) w/Device KIT Use as directed. DX: E11.65. 1 each 0   glimepiride (AMARYL) 4 MG tablet TAKE 1 TABLET BY MOUTH EVERY MORNING. 90 tablet 1   Glucosamine-Chondroitin (OSTEO BI-FLEX REGULAR STRENGTH PO) Take 1 tablet by mouth daily as needed (For joint health.). Reported on 08/17/2015     latanoprost (XALATAN) 0.005 % ophthalmic solution Place 1 drop into both eyes at bedtime.  4   losartan-hydrochlorothiazide (HYZAAR) 50-12.5 MG tablet TAKE 1 TABLET BY MOUTH  EVERY DAY 90 tablet 1   metFORMIN (GLUCOPHAGE-XR) 500 MG 24 hr tablet TAKE 1 TABLET TWICE DAILY  WITH MEALS **SUN MFR** 180 tablet 2   ONE TOUCH LANCETS MISC Check Blood Sugars twice per day. DX: E11.65 100 each 5   timolol (TIMOPTIC) 0.5 % ophthalmic solution INSTILL 1 DROP INTO BOTH EYES EVERY DAY     aspirin EC 81 MG tablet Take 81 mg by mouth daily.     No facility-administered medications prior to visit.    Allergies  Allergen Reactions   Percocet [Oxycodone-Acetaminophen] Nausea And Vomiting   Acetaminophen Nausea And Vomiting and Other (See Comments)    Allergic to Tylenol #3   Actos [Pioglitazone Hydrochloride] Hives   Latex Swelling and Other (See Comments)    Lip swelling   Lisinopril Cough   Shrimp [Shellfish Allergy] Itching   Zocor [Simvastatin - High Dose] Itching   Penicillins Rash and Other (See Comments)    Has patient had a PCN reaction causing immediate rash, facial/tongue/throat swelling, SOB or lightheadedness with hypotension: yes Has patient had a PCN reaction causing severe rash involving mucus membranes or skin necrosis: no Has patient had a PCN reaction that required hospitalization no Has patient had a PCN reaction occurring within the last 10 years: no If all of the above answers are "NO", then may proceed with Cephalosporin use.     ROS Review of Systems  Constitutional:  Negative for fatigue and unexpected weight change.  Eyes:  Negative for visual disturbance.  Respiratory:  Negative for cough, chest tightness, shortness of breath and wheezing.   Cardiovascular:  Negative for chest pain, palpitations and leg swelling.  Endocrine: Negative for polydipsia and polyuria.  Musculoskeletal:  Positive for arthralgias and back pain.  Neurological:  Negative for dizziness, seizures, syncope, weakness, light-headedness and headaches.     Objective:    Physical Exam Constitutional:      Appearance: She is well-developed.  Eyes:     Pupils: Pupils are  equal, round, and reactive to light.  Neck:     Thyroid: No thyromegaly.     Vascular: No JVD.  Cardiovascular:     Rate and Rhythm: Normal rate and regular rhythm.     Heart sounds:    No gallop.  Pulmonary:     Effort: Pulmonary effort is normal. No respiratory distress.     Breath sounds: Normal breath sounds. No wheezing or rales.  Musculoskeletal:     Cervical back: Neck supple.     Right lower leg: No edema.     Left lower leg: No edema.  Neurological:     Mental Status: She is alert.    BP 132/64 (BP  Location: Left Arm, Patient Position: Sitting, Cuff Size: Normal)   Pulse 89   Temp 98.2 F (36.8 C) (Oral)   Wt 172 lb 12.8 oz (78.4 kg)   SpO2 97%   BMI 32.65 kg/m  Wt Readings from Last 3 Encounters:  08/31/20 172 lb 12.8 oz (78.4 kg)  08/18/20 175 lb 9.6 oz (79.7 kg)  03/03/20 175 lb 6.4 oz (79.6 kg)     There are no preventive care reminders to display for this patient.  There are no preventive care reminders to display for this patient.  Lab Results  Component Value Date   TSH 4.19 05/19/2014   Lab Results  Component Value Date   WBC 5.0 07/31/2015   HGB 11.5 (L) 07/31/2015   HCT 35.6 (L) 07/31/2015   MCV 93.9 07/31/2015   PLT 178 07/31/2015   Lab Results  Component Value Date   NA 139 03/02/2020   K 3.7 03/02/2020   CHLORIDE 107 06/10/2015   CO2 31 03/02/2020   GLUCOSE 157 (H) 03/02/2020   BUN 10 03/02/2020   CREATININE 0.67 03/02/2020   BILITOT 1.1 03/02/2020   ALKPHOS 73 03/02/2020   AST 14 03/02/2020   ALT 11 03/02/2020   PROT 6.4 03/02/2020   ALBUMIN 4.0 03/02/2020   CALCIUM 9.2 03/02/2020   ANIONGAP 7 07/31/2015   EGFR 71 (L) 06/10/2015   GFR 80.50 03/02/2020   Lab Results  Component Value Date   CHOL 111 03/02/2020   Lab Results  Component Value Date   HDL 63.20 03/02/2020   Lab Results  Component Value Date   LDLCALC 37 03/02/2020   Lab Results  Component Value Date   TRIG 53.0 03/02/2020   Lab Results  Component  Value Date   CHOLHDL 2 03/02/2020   Lab Results  Component Value Date   HGBA1C 6.8 (A) 08/31/2020      Assessment & Plan:   #1 type 2 diabetes well controlled with A1c 6.8%  -Continue metformin and glimepiride.  Continue to monitor blood sugars closely.  Continue yearly eye exam.  Recommend 32-monthfollow-up  #2 hypertension stable on Hyzaar. -Continue current dosage of Hyzaar and recheck basic metabolic panel at 66-monthollow-up  #3 dyslipidemia.  Reviewed lipids from January.  Cholesterol well controlled.  Continue Lipitor 20 mg daily and recheck lipid and hepatic panel in 6-12-monthllow-up  #4 health maintenance.  We discussed shingles vaccine and COVID-vaccine but she declines both.  She has also declined flu vaccines in the past.   No orders of the defined types were placed in this encounter.   Follow-up: Return in about 6 months (around 03/03/2021).    BruCarolann LittlerD

## 2020-08-31 NOTE — Patient Instructions (Signed)
STOP the aspirin.  A1C today is 6.8% which is stable.  Let's plan on 6 month follow up.

## 2020-09-10 ENCOUNTER — Other Ambulatory Visit: Payer: Self-pay | Admitting: Family Medicine

## 2020-09-22 ENCOUNTER — Other Ambulatory Visit: Payer: Self-pay

## 2020-09-22 ENCOUNTER — Ambulatory Visit (INDEPENDENT_AMBULATORY_CARE_PROVIDER_SITE_OTHER): Payer: 59 | Admitting: Family Medicine

## 2020-09-22 ENCOUNTER — Ambulatory Visit: Payer: Self-pay

## 2020-09-22 DIAGNOSIS — M25562 Pain in left knee: Secondary | ICD-10-CM | POA: Diagnosis not present

## 2020-09-22 DIAGNOSIS — G8929 Other chronic pain: Secondary | ICD-10-CM

## 2020-09-22 DIAGNOSIS — M1712 Unilateral primary osteoarthritis, left knee: Secondary | ICD-10-CM

## 2020-09-22 NOTE — Patient Instructions (Addendum)
Thank you for coming in today.   You received a gel injection today in your left knee. Seek immediate medical attention if the joint becomes red, extremely painful, or is oozing fluid.   Please schedule your next gel injection for next week and #3 for the following week.

## 2020-09-22 NOTE — Progress Notes (Signed)
[name] [name] presents to clinic today for orthovisc injection left knee 1/3  Procedure: Real-time Ultrasound Guided Injection of left knee superior lateral patellar space Device: Philips Affiniti 50G Images permanently stored and available for review in PACS Verbal informed consent obtained.  Discussed risks and benefits of procedure. Warned about infection bleeding damage to structures skin hypopigmentation and fat atrophy among others. Patient expresses understanding and agreement Time-out conducted.   Noted no overlying erythema, induration, or other signs of local infection.   Skin prepped in a sterile fashion.   Local anesthesia: Topical Ethyl chloride.   With sterile technique and under real time ultrasound guidance: Orthovisc 30 mg injected into knee joint. Fluid seen entering the joint capsule.   Completed without difficulty   Advised to call if fevers/chills, erythema, induration, drainage, or persistent bleeding.   Images permanently stored and available for review in the ultrasound unit.  Impression: Technically successful ultrasound guided injection.  Lot [id number]  Return in 1 week for Orthovisc injection left knee 2/3

## 2020-09-28 ENCOUNTER — Other Ambulatory Visit: Payer: Self-pay

## 2020-09-28 ENCOUNTER — Ambulatory Visit (INDEPENDENT_AMBULATORY_CARE_PROVIDER_SITE_OTHER): Payer: 59 | Admitting: Family Medicine

## 2020-09-28 ENCOUNTER — Ambulatory Visit: Payer: Self-pay

## 2020-09-28 DIAGNOSIS — M1712 Unilateral primary osteoarthritis, left knee: Secondary | ICD-10-CM

## 2020-09-28 DIAGNOSIS — G8929 Other chronic pain: Secondary | ICD-10-CM | POA: Diagnosis not present

## 2020-09-28 DIAGNOSIS — M25562 Pain in left knee: Secondary | ICD-10-CM

## 2020-09-28 NOTE — Patient Instructions (Signed)
Good to see you today.  You had a L knee Orthovisc injection #2.  Call or go to the ER if you develop a large red swollen joint with extreme pain or oozing puss.   We'll see you next week for the 3rd injection in the series.

## 2020-09-28 NOTE — Progress Notes (Signed)
[name] [name] presents to clinic today for Orthovisc injection left knee 2/3   Procedure: Real-time Ultrasound Guided Injection of left knee superior lateral patellar space Device: Philips Affiniti 50G Images permanently stored and available for review in PACS Verbal informed consent obtained.  Discussed risks and benefits of procedure. Warned about infection bleeding damage to structures skin hypopigmentation and fat atrophy among others. Patient expresses understanding and agreement Time-out conducted.   Noted no overlying erythema, induration, or other signs of local infection.   Skin prepped in a sterile fashion.   Local anesthesia: Topical Ethyl chloride.   With sterile technique and under real time ultrasound guidance: Orthovisc 30 mg injected into knee joint. Fluid seen entering the joint capsule.   Completed without difficulty   Advised to call if fevers/chills, erythema, induration, drainage, or persistent bleeding.   Images permanently stored and available for review in the ultrasound unit.  Impression: Technically successful ultrasound guided injection.    Lot [id number]

## 2020-10-05 ENCOUNTER — Encounter: Payer: Self-pay | Admitting: Family Medicine

## 2020-10-05 ENCOUNTER — Ambulatory Visit: Payer: Self-pay

## 2020-10-05 ENCOUNTER — Ambulatory Visit: Payer: 59 | Admitting: Family Medicine

## 2020-10-05 DIAGNOSIS — M1712 Unilateral primary osteoarthritis, left knee: Secondary | ICD-10-CM

## 2020-10-05 DIAGNOSIS — M25562 Pain in left knee: Secondary | ICD-10-CM | POA: Diagnosis not present

## 2020-10-05 DIAGNOSIS — G8929 Other chronic pain: Secondary | ICD-10-CM | POA: Diagnosis not present

## 2020-10-05 NOTE — Progress Notes (Signed)
[name] [name] presents to clinic today for Orthovisc injection left knee 3/3  Procedure: Real-time Ultrasound Guided Injection of left knee superior lateral patellar space Device: Philips Affiniti 50G Images permanently stored and available for review in PACS Verbal informed consent obtained.  Discussed risks and benefits of procedure. Warned about infection bleeding damage to structures skin hypopigmentation and fat atrophy among others. Patient expresses understanding and agreement Time-out conducted.   Noted no overlying erythema, induration, or other signs of local infection.   Skin prepped in a sterile fashion.   Local anesthesia: Topical Ethyl chloride.   With sterile technique and under real time ultrasound guidance: Orthovisc 30 mg injected into knee joint. Fluid seen entering the joint capsule.   Completed without difficulty   Advised to call if fevers/chills, erythema, induration, drainage, or persistent bleeding.   Images permanently stored and available for review in the ultrasound unit.  Impression: Technically successful ultrasound guided injection.  Lot [id number]  Return as needed

## 2020-10-05 NOTE — Patient Instructions (Addendum)
Good to see you today.  You had your 3rd L knee Orthovisc injection.  Call or go to the ER if you develop a large red swollen joint with extreme pain or oozing puss.   Follow-up as needed.

## 2020-10-11 NOTE — Progress Notes (Signed)
Patient Care Team: Eulas Post, MD as PCP - General (Family Medicine) Sylvan Cheese, NP as Nurse Practitioner (Hematology and Oncology)  DIAGNOSIS:    ICD-10-CM   1. Malignant neoplasm of upper-outer quadrant of right breast in female, estrogen receptor positive ([place])  C50.411    Z17.0       SUMMARY OF ONCOLOGIC HISTORY: Oncology History  Breast cancer of upper-outer quadrant of right female breast ([place])  06/02/2015 Initial Diagnosis   Right breast biopsy 10:00 position: Invasive lobular cancer with LCIS, ER 95%, PR 80%, Ki-67 15%, HER-2 negative duration 1.61, ultrasound revealed a 1.9 x 1 x 1.4 cm lesion, T1 cN0 stage IA clinical stage   07/09/2015 Surgery   Right lumpectomy: Invasive lobular cancer, grade 2, 1.9 cm, LCIS, ADH, lateral margin focally positive, 0/2 lymph nodes negative, ER 95%, PR 80%, Ki-67 15%, HER-2 negative, T1cN0 stage IA   07/31/2015 Surgery   Reresection surgical margins: Clear, Benign   09/01/2015 - 09/28/2015 Radiation Therapy   Adjuvant radiation therapy   10/22/2015 -  Anti-estrogen oral therapy   Anastrozole 1 mg daily     CHIEF COMPLIANT:  Follow-up of right breast cancer on anastrozole therapy  INTERVAL HISTORY: [name] [name] is a 86 y.o. with above-mentioned history of right breast cancer treated with lumpectomy, radiation, and who is currently on anastrozole therapy. Mammogram on 08/19/20 showed no evidence of malignancy bilaterally. She presents to the clinic today for annual follow-up.  She is tolerating anastrozole extremely well without any problems or concerns.  Her major complaint today is related to spots that have appeared on her chest wall and her back.  These are brownish spots that have irregular edges some of them do have irregular edges as well.  ALLERGIES:  is allergic to percocet [oxycodone-acetaminophen], acetaminophen, actos [pioglitazone hydrochloride], latex, lisinopril, shrimp [shellfish allergy], zocor  [simvastatin - high dose], and penicillins.  MEDICATIONS:  Current Outpatient Medications  Medication Sig Dispense Refill   ACCU-CHEK GUIDE test strip USE TO CHECK BLOOD SUGAR TWICE DAILY 100 each 3   anastrozole (ARIMIDEX) 1 MG tablet TAKE 1 TABLET BY MOUTH EVERY DAY 90 tablet 3   atorvastatin (LIPITOR) 20 MG tablet TAKE 1 TABLET BY MOUTH DAILY AT 6 PM. 90 tablet 1   Blood Glucose Monitoring Suppl (ONE TOUCH ULTRA SYSTEM KIT) w/Device KIT Use as directed. DX: E11.65. 1 each 0   glimepiride (AMARYL) 4 MG tablet TAKE 1 TABLET BY MOUTH EVERY DAY IN THE MORNING 90 tablet 1   Glucosamine-Chondroitin (OSTEO BI-FLEX REGULAR STRENGTH PO) Take 1 tablet by mouth daily as needed (For joint health.). Reported on 08/17/2015     latanoprost (XALATAN) 0.005 % ophthalmic solution Place 1 drop into both eyes at bedtime.  4   losartan-hydrochlorothiazide (HYZAAR) 50-12.5 MG tablet TAKE 1 TABLET BY MOUTH EVERY DAY 90 tablet 1   metFORMIN (GLUCOPHAGE-XR) 500 MG 24 hr tablet TAKE 1 TABLET TWICE DAILY  WITH MEALS **SUN MFR** 180 tablet 2   ONE TOUCH LANCETS MISC Check Blood Sugars twice per day. DX: E11.65 100 each 5   timolol (TIMOPTIC) 0.5 % ophthalmic solution INSTILL 1 DROP INTO BOTH EYES EVERY DAY     No current facility-administered medications for this visit.    PHYSICAL EXAMINATION: ECOG PERFORMANCE STATUS: 1 - Symptomatic but completely ambulatory  Vitals:   10/12/20 1050  BP: (!) 146/78  Pulse: 79  Resp: 18  Temp: 97.8 F (36.6 C)  SpO2: 99%   Filed Weights  10/12/20 1050  Weight: 174 lb 1.6 oz (79 kg)    BREAST: No palpable masses or nodules in either right or left breasts. No palpable axillary supraclavicular or infraclavicular adenopathy no breast tenderness or nipple discharge. (exam performed in the presence of a chaperone)  LABORATORY DATA:  I have reviewed the data as listed CMP Latest Ref Rng & Units 03/02/2020 02/27/2019 02/26/2018  Glucose 70 - 99 mg/dL 157(H) 173(H) 128(H)  BUN  6 - 23 mg/dL '10 11 10  ' Creatinine 0.40 - 1.20 mg/dL 0.67 0.71 0.71  Sodium 135 - 145 mEq/L 139 138 139  Potassium 3.5 - 5.1 mEq/L 3.7 3.5 3.6  Chloride 96 - 112 mEq/L 104 103 102  CO2 19 - 32 mEq/L '31 30 30  ' Calcium 8.4 - 10.5 mg/dL 9.2 9.2 9.5  Total Protein 6.0 - 8.3 g/dL 6.4 6.4 6.4  Total Bilirubin 0.2 - 1.2 mg/dL 1.1 1.0 1.0  Alkaline Phos 39 - 117 U/L 73 85 88  AST 0 - 37 U/L '14 14 14  ' ALT 0 - 35 U/L '11 11 11    ' Lab Results  Component Value Date   WBC 5.0 07/31/2015   HGB 11.5 (L) 07/31/2015   HCT 35.6 (L) 07/31/2015   MCV 93.9 07/31/2015   PLT 178 07/31/2015   NEUTROABS 4.0 06/10/2015    ASSESSMENT & PLAN:  Breast cancer of upper-outer quadrant of right female breast ([place]) Right lumpectomy 07/09/2015: Invasive lobular cancer, grade 2, 1.9 cm, LCIS, ADH, lateral margin focally positive, 0/2 lymph nodes negative, ER 95%, PR 80%, Ki-67 15%, HER-2 negative, T1cN0 stage IA   Adjuvant radiation therapy 09/01/2015 to 09/28/2015   Treatment plan: Adjuvant antiestrogen therapy with anastrozole 1 mg by mouth daily We discussed the duration of antiestrogen therapy.  She tolerates it extremely well. We decided to obtain a bone density test in the next couple of weeks.  If her bone density shows loss of bone density then we will stop the treatment.  If her bone density is excellent then she can decide to continue it for 2 more years.   Anastrozole Toxicities: 1. Occasional hot flashes 2. Myalgias/arthralgias    Breast cancer surveillance:  1.Breast exam 10/12/20: Benign 2. Mammogram 08/19/2020: Benign, breast density category B 3. Bone density 08/02/2016: T score -0.9: Normal  [place]: Some of them with irregular edges.  I recommended a follow-up with dermatology and I sent a new referral    RTC in 1 year for follow up.    No orders of the defined types were placed in this encounter.  The patient has a good understanding of the overall plan. she agrees with it. she will call  with any problems that may develop before the next visit here.  Total time spent: 20 mins including face to face time and time spent for planning, charting and coordination of care  Rulon Eisenmenger, MD, MPH 10/12/2020  I, Thana Ates, am acting as scribe for Dr. Nicholas Lose.  I have reviewed the above documentation for accuracy and completeness, and I agree with the above.

## 2020-10-12 ENCOUNTER — Other Ambulatory Visit: Payer: Self-pay

## 2020-10-12 ENCOUNTER — Inpatient Hospital Stay: Payer: 59 | Attending: Hematology and Oncology | Admitting: Hematology and Oncology

## 2020-10-12 VITALS — BP 146/78 | HR 79 | Temp 97.8°F | Resp 18 | Ht 61.0 in | Wt 174.1 lb

## 2020-10-12 DIAGNOSIS — Z923 Personal history of irradiation: Secondary | ICD-10-CM | POA: Insufficient documentation

## 2020-10-12 DIAGNOSIS — D229 Melanocytic nevi, unspecified: Secondary | ICD-10-CM | POA: Diagnosis not present

## 2020-10-12 DIAGNOSIS — Z79811 Long term (current) use of aromatase inhibitors: Secondary | ICD-10-CM | POA: Insufficient documentation

## 2020-10-12 DIAGNOSIS — Z79899 Other long term (current) drug therapy: Secondary | ICD-10-CM | POA: Diagnosis not present

## 2020-10-12 DIAGNOSIS — C50411 Malignant neoplasm of upper-outer quadrant of right female breast: Secondary | ICD-10-CM | POA: Insufficient documentation

## 2020-10-12 DIAGNOSIS — Z170 Estrogen receptor positive status [ER+]: Secondary | ICD-10-CM | POA: Diagnosis not present

## 2020-10-12 DIAGNOSIS — Z780 Asymptomatic menopausal state: Secondary | ICD-10-CM | POA: Diagnosis not present

## 2020-10-12 NOTE — Assessment & Plan Note (Signed)
Right lumpectomy 07/09/2015: Invasive lobular cancer, grade 2, 1.9 cm, LCIS, ADH, lateral margin focally positive, 0/2 lymph nodes negative, ER 95%, PR 80%, Ki-67 15%, HER-2 negative, T1cN0 stage IA  Adjuvant radiation therapy 09/01/2015 to 09/28/2015  Treatment plan: Adjuvant antiestrogen therapy with anastrozole 1 mg by mouth daily 5 years  Anastrozole Toxicities: 1. Occasional hot flashes 2. Myalgias/arthralgias   Breast cancer surveillance: 1.Breast exam 10/12/20: Benign 2.Mammogram 08/19/2020: Benign, breast density category B 3.Bone density 08/02/2016: T score -0.9: Normal  Mild breast lymphedema: Much improved.    RTC in1 yearfor follow up.

## 2020-10-13 ENCOUNTER — Ambulatory Visit: Payer: 59 | Admitting: Hematology and Oncology

## 2020-11-11 NOTE — Progress Notes (Signed)
Since patient did not get a bone density test, we will cancel today's appointment. We will try to obtain a bone density test and do a telephone visit after that. This encounter was created in error - please disregard.

## 2020-11-12 ENCOUNTER — Inpatient Hospital Stay: Payer: 59 | Attending: Hematology and Oncology | Admitting: Hematology and Oncology

## 2020-11-12 ENCOUNTER — Other Ambulatory Visit: Payer: Self-pay | Admitting: *Deleted

## 2020-11-12 DIAGNOSIS — C50411 Malignant neoplasm of upper-outer quadrant of right female breast: Secondary | ICD-10-CM

## 2020-11-12 DIAGNOSIS — Z780 Asymptomatic menopausal state: Secondary | ICD-10-CM

## 2020-11-12 MED ORDER — ANASTROZOLE 1 MG PO TABS: ORAL_TABLET | ORAL | 3 refills | Status: DC

## 2020-11-12 NOTE — Assessment & Plan Note (Signed)
Right lumpectomy 07/09/2015: Invasive lobular cancer, grade 2, 1.9 cm, LCIS, ADH, lateral margin focally positive, 0/2 lymph nodes negative, ER 95%, PR 80%, Ki-67 15%, HER-2 negative, T1cN0 stage IA  Adjuvant radiation therapy 09/01/2015 to 09/28/2015  Treatment plan: Adjuvant antiestrogen therapy with anastrozole 1 mg by mouth daily We discussed the duration of antiestrogen therapy.  She tolerates it extremely well. We decided to obtain a bone density test in the next couple of weeks.  If her bone density shows loss of bone density then we will stop the treatment.  If her bone density is excellent then she can decide to continue it for 2 more years.  Anastrozole Toxicities: 1. Occasional hot flashes 2. Myalgias/arthralgias   Breast cancer surveillance: 1.Breast exam9/22/2022: Benign 2.Mammogram06/29/2022: Benign, breast density category B 3.Bone density 08/02/2016: T score -0.9: Normal Next bone density has been set up for February 2023  RTC in1 yearfor follow up.

## 2020-11-23 ENCOUNTER — Ambulatory Visit (HOSPITAL_BASED_OUTPATIENT_CLINIC_OR_DEPARTMENT_OTHER)
Admission: RE | Admit: 2020-11-23 | Discharge: 2020-11-23 | Disposition: A | Discharge status: Home / Self Care | Payer: 59 | Source: Ambulatory Visit | Attending: Hematology and Oncology | Admitting: Hematology and Oncology

## 2020-11-23 ENCOUNTER — Other Ambulatory Visit: Payer: Self-pay

## 2020-11-23 DIAGNOSIS — C50411 Malignant neoplasm of upper-outer quadrant of right female breast: Secondary | ICD-10-CM | POA: Diagnosis not present

## 2020-11-23 DIAGNOSIS — Z170 Estrogen receptor positive status [ER+]: Secondary | ICD-10-CM | POA: Diagnosis present

## 2020-11-23 DIAGNOSIS — Z780 Asymptomatic menopausal state: Secondary | ICD-10-CM

## 2020-11-24 ENCOUNTER — Encounter: Payer: Self-pay | Admitting: *Deleted

## 2020-11-24 NOTE — Progress Notes (Signed)
Per MD pt recent bone density showed mild osteopenia. MD advised pt to continue with calcium, vitamin D and exercises. RN attempt x1 to contact pt.  Pt notified of results, educated and verbalized understanding.

## 2021-01-30 ENCOUNTER — Other Ambulatory Visit: Payer: Self-pay | Admitting: Family Medicine

## 2021-02-18 ENCOUNTER — Other Ambulatory Visit: Payer: Self-pay | Admitting: Family Medicine

## 2021-03-03 ENCOUNTER — Ambulatory Visit: Payer: 59 | Admitting: Family Medicine

## 2021-03-03 VITALS — BP 130/60 | HR 80 | Temp 98.5°F | Wt 177.5 lb

## 2021-03-03 DIAGNOSIS — E1165 Type 2 diabetes mellitus with hyperglycemia: Secondary | ICD-10-CM

## 2021-03-03 DIAGNOSIS — E783 Hyperchylomicronemia: Secondary | ICD-10-CM

## 2021-03-03 DIAGNOSIS — I10 Essential (primary) hypertension: Secondary | ICD-10-CM | POA: Diagnosis not present

## 2021-03-03 LAB — HEPATIC FUNCTION PANEL
ALT: 11 U/L (ref 0–35)
AST: 17 U/L (ref 0–37)
Albumin: 3.9 g/dL (ref 3.5–5.2)
Alkaline Phosphatase: 78 U/L (ref 39–117)
Bilirubin, Direct: 0.3 mg/dL (ref 0.0–0.3)
Total Bilirubin: 1.3 mg/dL — ABNORMAL HIGH (ref 0.2–1.2)
Total Protein: 6.6 g/dL (ref 6.0–8.3)

## 2021-03-03 LAB — POCT GLYCOSYLATED HEMOGLOBIN (HGB A1C): Hemoglobin A1C: 6.9 % — AB (ref 4.0–5.6)

## 2021-03-03 LAB — CBC WITH DIFFERENTIAL/PLATELET
Basophils Absolute: 0 10*3/uL (ref 0.0–0.1)
Basophils Relative: 0.6 % (ref 0.0–3.0)
Eosinophils Absolute: 0.2 10*3/uL (ref 0.0–0.7)
Eosinophils Relative: 5.6 % — ABNORMAL HIGH (ref 0.0–5.0)
HCT: 36.4 % (ref 36.0–46.0)
Hemoglobin: 11.8 g/dL — ABNORMAL LOW (ref 12.0–15.0)
Lymphocytes Relative: 30.8 % (ref 12.0–46.0)
Lymphs Abs: 1.1 10*3/uL (ref 0.7–4.0)
MCHC: 32.5 g/dL (ref 30.0–36.0)
MCV: 94.1 fl (ref 78.0–100.0)
Monocytes Absolute: 0.4 10*3/uL (ref 0.1–1.0)
Monocytes Relative: 10.1 % (ref 3.0–12.0)
Neutro Abs: 1.9 10*3/uL (ref 1.4–7.7)
Neutrophils Relative %: 52.9 % (ref 43.0–77.0)
Platelets: 181 10*3/uL (ref 150.0–400.0)
RBC: 3.86 Mil/uL — ABNORMAL LOW (ref 3.87–5.11)
RDW: 14.3 % (ref 11.5–15.5)
WBC: 3.6 10*3/uL — ABNORMAL LOW (ref 4.0–10.5)

## 2021-03-03 LAB — BASIC METABOLIC PANEL
BUN: 12 mg/dL (ref 6–23)
CO2: 30 mEq/L (ref 19–32)
Calcium: 9.3 mg/dL (ref 8.4–10.5)
Chloride: 104 mEq/L (ref 96–112)
Creatinine, Ser: 0.64 mg/dL (ref 0.40–1.20)
GFR: 80.82 mL/min (ref >60.00)
Glucose, Bld: 192 mg/dL — ABNORMAL HIGH (ref 70–99)
Potassium: 3.4 mEq/L — ABNORMAL LOW (ref 3.5–5.1)
Sodium: 140 mEq/L (ref 135–145)

## 2021-03-03 LAB — LIPID PANEL
Cholesterol: 111 mg/dL (ref 0–200)
HDL: 64.8 mg/dL (ref >39.00)
LDL Cholesterol: 38 mg/dL (ref 0–99)
NonHDL: 45.9
Total CHOL/HDL Ratio: 2
Triglycerides: 40 mg/dL (ref 0.0–149.0)
VLDL: 8 mg/dL (ref 0.0–40.0)

## 2021-03-03 NOTE — Progress Notes (Signed)
Established Patient Office Visit  Subjective:  Patient ID: [name] [name], female    DOB: 1934-07-21  Age: 87 y.o. MRN: [id number]  CC:  Chief Complaint  Patient presents with   Follow-up    HPI [name] [name] presents for medical follow-up.  She has been generally doing well.  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  She has chronic problems including hypertension, type 2 diabetes, osteoarthritis, history of breast cancer, hyperlipidemia, history of kidney stones.  Denies any recent falls.  She sees podiatrist regularly for nail trimming.  Still sees oncology.  Medications reviewed.  Compliant with all.  Denies any recent hypoglycemic symptoms.  Diabetes has been well controlled.  She denies any recent chest pains.  No dyspnea.  She declines flu vaccination.  Past Medical History:  Diagnosis Date   Arthritis    Breast cancer ([place]) 2017   Right Breast Cancer   Breast cancer of upper-outer quadrant of right female breast ([place]) 06/04/2015   Chronic kidney disease    stones   Diabetes mellitus    Hyperlipidemia    Hypertension    Personal history of radiation therapy 2017   Right Breast Cancer   PONV (postoperative nausea and vomiting)    UTI (urinary tract infection)     Past Surgical History:  Procedure Laterality Date   ABDOMINAL HYSTERECTOMY  1980   BREAST BIOPSY Right 2018/6   benign   BREAST LUMPECTOMY Right 2017   CATARACT EXTRACTION     CHOLECYSTECTOMY  1989   COLONOSCOPY     RADIOACTIVE SEED GUIDED PARTIAL MASTECTOMY WITH AXILLARY SENTINEL LYMPH NODE BIOPSY Right 07/09/2015   Procedure: RADIOACTIVE SEED GUIDED PARTIAL MASTECTOMY WITH AXILLARY SENTINEL LYMPH NODE BIOPSY;  Surgeon: Erroll Luna, MD;  Location: [facility];  Service: General;  Laterality: Right;   RE-EXCISION OF BREAST LUMPECTOMY Right 07/31/2015   Procedure: RE-EXCISION OF RIGHT BREAST LUMPECTOMY;  Surgeon: Erroll Luna, MD;  Location: [facility];  Service: General;   Laterality: Right;   TONSILLECTOMY  1953    Family History  Problem Relation Age of Onset   Cancer Mother        uterine   Heart disease Father 31   Diabetes Sister    Diabetes Brother     Social History   Socioeconomic History   Marital status: Widowed    Spouse name: Not on file   Number of children: Not on file   Years of education: Not on file   Highest education level: Not on file  Occupational History   Not on file  Tobacco Use   Smoking status: Never   Smokeless tobacco: Never  Vaping Use   Vaping Use: Never used  Substance and Sexual Activity   Alcohol use: No   Drug use: No   Sexual activity: Not on file  Other Topics Concern   Not on file  Social History Narrative   Not on file   Social Determinants of Health   Financial Resource Strain: Not on file  Food Insecurity: Not on file  Transportation Needs: Not on file  Physical Activity: Not on file  Stress: Not on file  Social Connections: Not on file  Intimate Partner Violence: Not on file    Outpatient Medications Prior to Visit  Medication Sig Dispense Refill   ACCU-CHEK GUIDE test strip USE TO CHECK BLOOD SUGAR TWICE DAILY 100 each 3   anastrozole (ARIMIDEX) 1 MG tablet TAKE 1 TABLET BY MOUTH EVERY DAY  90 tablet 3   atorvastatin (LIPITOR) 20 MG tablet TAKE 1 TABLET BY MOUTH DAILY AT 6 PM. 90 tablet 1   Blood Glucose Monitoring Suppl (ONE TOUCH ULTRA SYSTEM KIT) w/Device KIT Use as directed. DX: E11.65. 1 each 0   glimepiride (AMARYL) 4 MG tablet TAKE 1 TABLET BY MOUTH EVERY DAY IN THE MORNING 90 tablet 1   Glucosamine-Chondroitin (OSTEO BI-FLEX REGULAR STRENGTH PO) Take 1 tablet by mouth daily as needed (For joint health.). Reported on 08/17/2015     latanoprost (XALATAN) 0.005 % ophthalmic solution Place 1 drop into both eyes at bedtime.  4   losartan-hydrochlorothiazide (HYZAAR) 50-12.5 MG tablet TAKE 1 TABLET BY MOUTH EVERY DAY 90 tablet 1   metFORMIN (GLUCOPHAGE-XR) 500 MG 24 hr tablet TAKE 1  TABLET TWICE DAILY  WITH MEALS **SUN MFR** 180 tablet 2   ONE TOUCH LANCETS MISC Check Blood Sugars twice per day. DX: E11.65 100 each 5   timolol (TIMOPTIC) 0.5 % ophthalmic solution INSTILL 1 DROP INTO BOTH EYES EVERY DAY     No facility-administered medications prior to visit.    Allergies  Allergen Reactions   Percocet [Oxycodone-Acetaminophen] Nausea And Vomiting   Acetaminophen Nausea And Vomiting and Other (See Comments)    Allergic to Tylenol #3   Actos [Pioglitazone Hydrochloride] Hives   Latex Swelling and Other (See Comments)    Lip swelling   Lisinopril Cough   Shrimp [Shellfish Allergy] Itching   Zocor [Simvastatin - High Dose] Itching   Penicillins Rash and Other (See Comments)    Has patient had a PCN reaction causing immediate rash, facial/tongue/throat swelling, SOB or lightheadedness with hypotension: yes Has patient had a PCN reaction causing severe rash involving mucus membranes or skin necrosis: no Has patient had a PCN reaction that required hospitalization no Has patient had a PCN reaction occurring within the last 10 years: no If all of the above answers are "NO", then may proceed with Cephalosporin use.     ROS Review of Systems  Constitutional:  Negative for fatigue and unexpected weight change.  Eyes:  Negative for visual disturbance.  Respiratory:  Negative for cough, chest tightness, shortness of breath and wheezing.   Cardiovascular:  Negative for chest pain, palpitations and leg swelling.  Musculoskeletal:  Positive for arthralgias.  Neurological:  Negative for dizziness, seizures, syncope, weakness, light-headedness and headaches.     Objective:    Physical Exam Constitutional:      Appearance: She is well-developed.  Eyes:     Pupils: Pupils are equal, round, and reactive to light.  Neck:     Thyroid: No thyromegaly.     Vascular: No JVD.  Cardiovascular:     Rate and Rhythm: Normal rate and regular rhythm.     Heart sounds:    No  gallop.  Pulmonary:     Effort: Pulmonary effort is normal. No respiratory distress.     Breath sounds: Normal breath sounds. No wheezing or rales.  Musculoskeletal:     Cervical back: Neck supple.  Skin:    Comments: Has bunions both feet.  Generally dry skin but no calluses.  Normal sensory function throughout.  Both feet are warm with good capillary refill.  No ulcerations  Neurological:     Mental Status: She is alert.    BP 130/60 (BP Location: Left Arm, Patient Position: Sitting, Cuff Size: Normal)    Pulse 80    Temp 98.5 F (36.9 C) (Oral)    Wt 177 lb 8  oz (80.5 kg)    SpO2 96%    BMI 33.54 kg/m  Wt Readings from Last 3 Encounters:  03/03/21 177 lb 8 oz (80.5 kg)  10/12/20 174 lb 1.6 oz (79 kg)  08/31/20 172 lb 12.8 oz (78.4 kg)     Health Maintenance Due  Topic Date Due   COVID-19 Vaccine (1) Never done   Pneumonia Vaccine 47+ Years old (1 - PCV) Never done   TETANUS/TDAP  Never done   Zoster Vaccines- Shingrix (1 of 2) Never done   FOOT EXAM  03/02/2021    There are no preventive care reminders to display for this patient.  Lab Results  Component Value Date   TSH 4.19 05/19/2014   Lab Results  Component Value Date   WBC 5.0 07/31/2015   HGB 11.5 (L) 07/31/2015   HCT 35.6 (L) 07/31/2015   MCV 93.9 07/31/2015   PLT 178 07/31/2015   Lab Results  Component Value Date   NA 139 03/02/2020   K 3.7 03/02/2020   CHLORIDE 107 06/10/2015   CO2 31 03/02/2020   GLUCOSE 157 (H) 03/02/2020   BUN 10 03/02/2020   CREATININE 0.67 03/02/2020   BILITOT 1.1 03/02/2020   ALKPHOS 73 03/02/2020   AST 14 03/02/2020   ALT 11 03/02/2020   PROT 6.4 03/02/2020   ALBUMIN 4.0 03/02/2020   CALCIUM 9.2 03/02/2020   ANIONGAP 7 07/31/2015   EGFR 71 (L) 06/10/2015   GFR 80.50 03/02/2020   Lab Results  Component Value Date   CHOL 111 03/02/2020   Lab Results  Component Value Date   HDL 63.20 03/02/2020   Lab Results  Component Value Date   LDLCALC 37 03/02/2020    Lab Results  Component Value Date   TRIG 53.0 03/02/2020   Lab Results  Component Value Date   CHOLHDL 2 03/02/2020   Lab Results  Component Value Date   HGBA1C 6.9 (A) 03/03/2021      Assessment & Plan:   #1 type 2 diabetes controlled with A1c 6.9%.  She is on regimen of metformin and glimepiride.  We did discuss potential risk of hypoglycemia with glimepiride but she has never had any hypoglycemic symptoms.  We did discuss other alternative groups such as SGLT2 or GLP-1 medication but also explained these would likely come with significantly higher cost even if we can get these approved.  At this point she is doing well with current regimen and we have elected to keep her medications the same  #2 hypertension stable and well-controlled.  Continue losartan HCTZ.  Check basic metabolic panel  #3 hyperlipidemia.  Patient on atorvastatin.  Check lipid and hepatic panel   Recommend 88-monthfollow-up unless indicated otherwise.  We did offer flu vaccine today and she declines     No orders of the defined types were placed in this encounter.   Follow-up: Return in about 6 months (around 08/31/2021).    BCarolann Littler MD

## 2021-04-05 ENCOUNTER — Other Ambulatory Visit: Payer: 59

## 2021-04-06 ENCOUNTER — Other Ambulatory Visit: Payer: Self-pay | Admitting: Family Medicine

## 2021-06-13 IMAGING — DX DG KNEE AP/LAT W/ SUNRISE*L*
3 series · 3 of 3 positions shown · non-contrast
Comparison: None.

CLINICAL DATA: Chronic left knee pain.  No known injury.

EXAM:
LEFT KNEE 3 VIEWS

[knee ap]
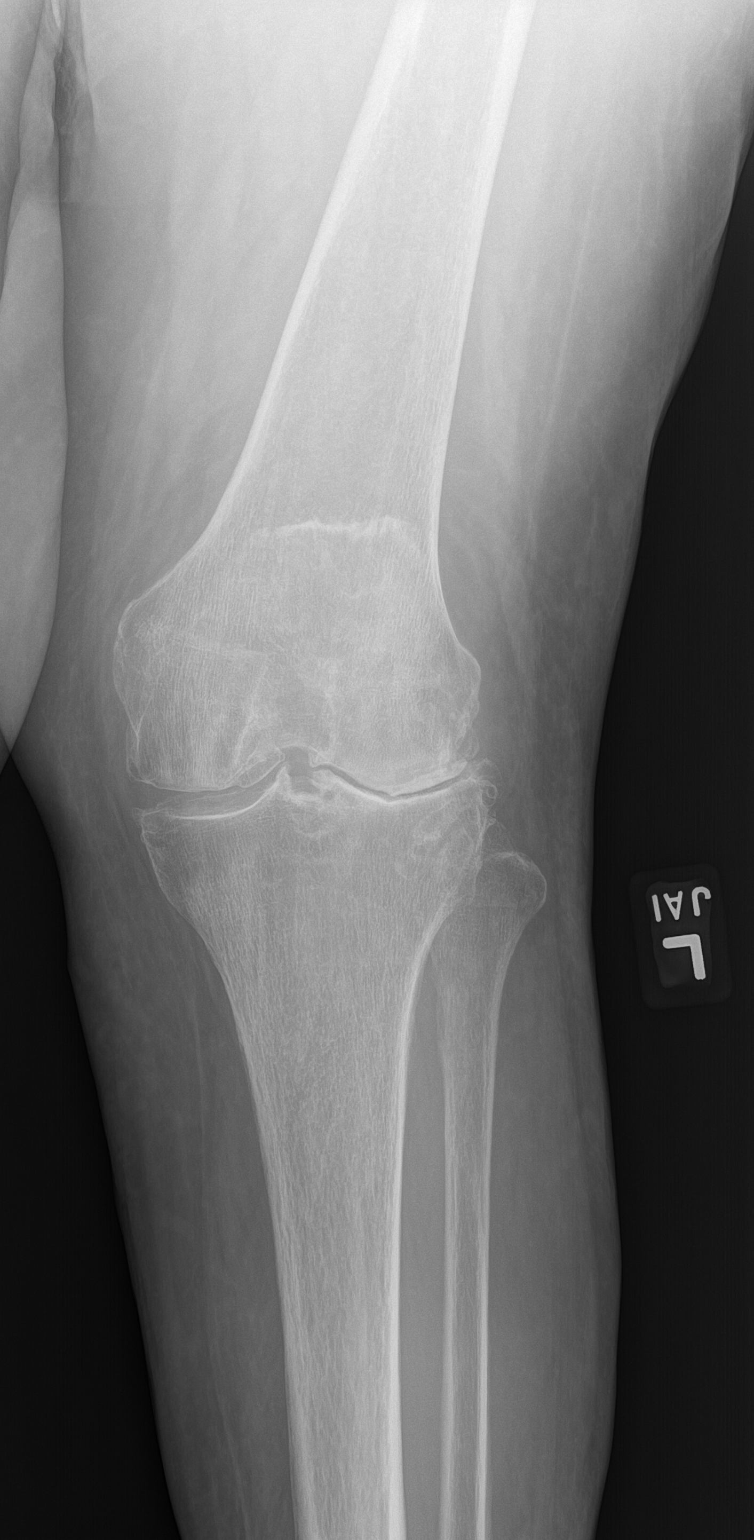

[knee lat]
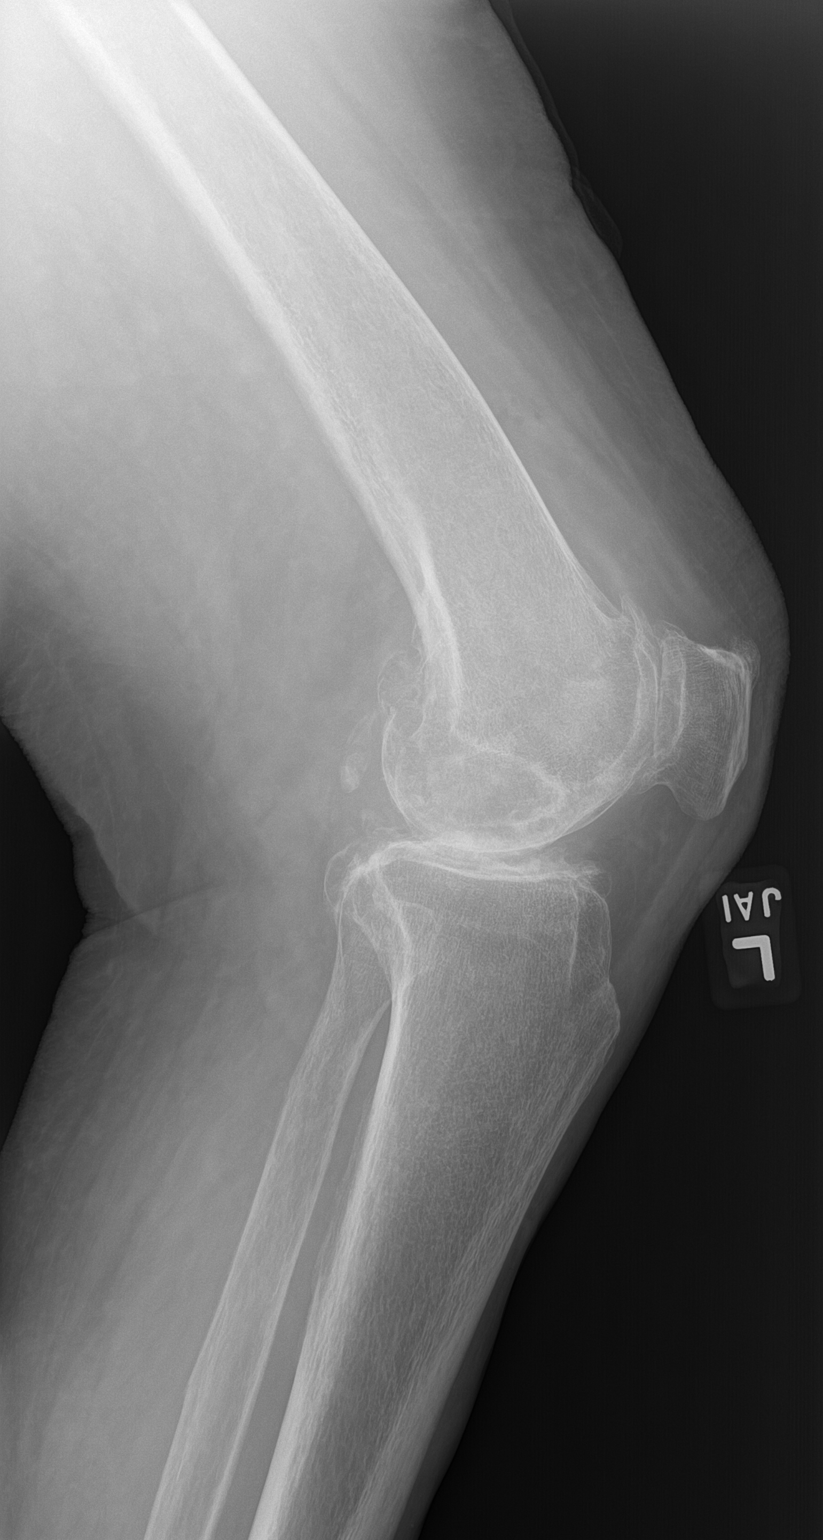

[patella]
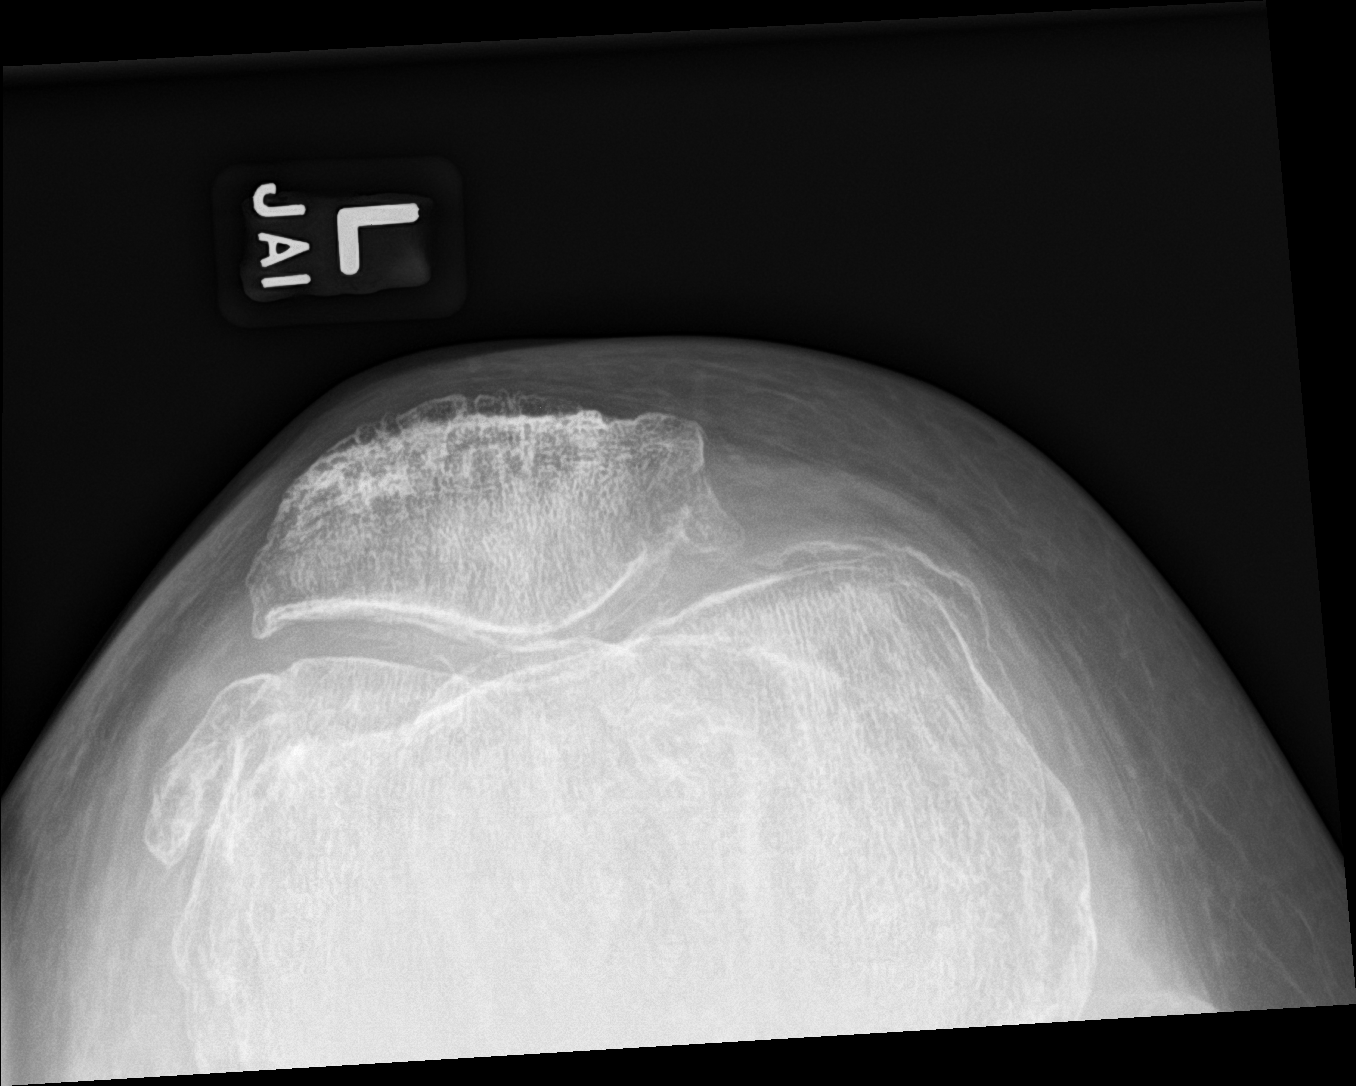

[3 of 3 positions shown; findings below may reference images not displayed]

FINDINGS: Advanced tricompartment degenerative changes with joint space
narrowing and spurring. No significant joint effusion. No acute bony
abnormality. Specifically, no fracture, subluxation, or dislocation.
IMPRESSION: Advanced degenerative changes.  No acute bony abnormality.

## 2021-07-26 ENCOUNTER — Other Ambulatory Visit: Payer: Self-pay | Admitting: Hematology and Oncology

## 2021-07-26 DIAGNOSIS — Z1231 Encounter for screening mammogram for malignant neoplasm of breast: Secondary | ICD-10-CM

## 2021-08-12 ENCOUNTER — Other Ambulatory Visit: Payer: Self-pay | Admitting: Family Medicine

## 2021-08-20 ENCOUNTER — Ambulatory Visit
Admission: RE | Admit: 2021-08-20 | Discharge: 2021-08-20 | Disposition: A | Discharge status: Home / Self Care | Payer: 59 | Source: Ambulatory Visit | Attending: Hematology and Oncology | Admitting: Hematology and Oncology

## 2021-08-20 ENCOUNTER — Ambulatory Visit: Payer: 59

## 2021-08-20 DIAGNOSIS — Z1231 Encounter for screening mammogram for malignant neoplasm of breast: Secondary | ICD-10-CM

## 2021-08-31 ENCOUNTER — Encounter: Payer: Self-pay | Admitting: Family Medicine

## 2021-08-31 ENCOUNTER — Ambulatory Visit: Payer: 59 | Admitting: Family Medicine

## 2021-08-31 VITALS — BP 126/62 | HR 72 | Temp 97.6°F | Ht 61.0 in | Wt 172.6 lb

## 2021-08-31 DIAGNOSIS — E1165 Type 2 diabetes mellitus with hyperglycemia: Secondary | ICD-10-CM

## 2021-08-31 DIAGNOSIS — E783 Hyperchylomicronemia: Secondary | ICD-10-CM

## 2021-08-31 DIAGNOSIS — I10 Essential (primary) hypertension: Secondary | ICD-10-CM

## 2021-08-31 LAB — POCT GLYCOSYLATED HEMOGLOBIN (HGB A1C): Hemoglobin A1C: 6.7 % — AB (ref 4.0–5.6)

## 2021-08-31 MED ORDER — LOSARTAN POTASSIUM-HCTZ 50-12.5 MG PO TABS: 1.0000 | ORAL_TABLET | Freq: Every day | ORAL | 3 refills | Status: DC

## 2021-08-31 MED ORDER — METFORMIN HCL ER 500 MG PO TB24: ORAL_TABLET | ORAL | 3 refills | Status: DC

## 2021-08-31 MED ORDER — GLIPIZIDE 5 MG PO TABS: 5.0000 mg | ORAL_TABLET | Freq: Every day | ORAL | 3 refills | Status: DC

## 2021-08-31 MED ORDER — ATORVASTATIN CALCIUM 20 MG PO TABS: ORAL_TABLET | ORAL | 3 refills | Status: DC

## 2021-08-31 NOTE — Patient Instructions (Signed)
STOP the Glimepiride 4 mg daily  START Glipizide 5 mg once daily.

## 2021-08-31 NOTE — Progress Notes (Signed)
Established Patient Office Visit  Subjective   Patient ID: [name] [name], female    DOB: [birth year]  Age: 87 y.o. MRN: [id number]  Chief Complaint  Patient presents with   Follow-up    HPI   [name] [name] is here for medical follow-up.  She has history of hypertension, type 2 diabetes, hyperlipidemia.  Medications reviewed.  Generally doing well except for some arthritis in her right knee and hands.  She had labs in January which were reviewed.  Cholesterol well controlled.  Good kidney function.  Slightly low potassium 3.4.  Only 1 mild hypoglycemic symptoms over the past several months.  Diabetes has been well controlled.  She remains on metformin extended release and is currently on glimepiride.  She takes Lipitor for hyperlipidemia.  Her hypertension is controlled with losartan/HCTZ.  No recent dizziness.  No chest pains.  Past Medical History:  Diagnosis Date   Arthritis    Breast cancer ([place]) 2017   Right Breast Cancer   Breast cancer of upper-outer quadrant of right female breast ([place]) 06/04/2015   Chronic kidney disease    stones   Diabetes mellitus    Hyperlipidemia    Hypertension    Personal history of radiation therapy 2017   Right Breast Cancer   PONV (postoperative nausea and vomiting)    UTI (urinary tract infection)    Past Surgical History:  Procedure Laterality Date   ABDOMINAL HYSTERECTOMY  1980   BREAST BIOPSY Right 2018/6   benign   BREAST LUMPECTOMY Right 2017   CATARACT EXTRACTION     CHOLECYSTECTOMY  1989   COLONOSCOPY     RADIOACTIVE SEED GUIDED PARTIAL MASTECTOMY WITH AXILLARY SENTINEL LYMPH NODE BIOPSY Right 07/09/2015   Procedure: RADIOACTIVE SEED GUIDED PARTIAL MASTECTOMY WITH AXILLARY SENTINEL LYMPH NODE BIOPSY;  Surgeon: Erroll Luna, MD;  Location: [facility];  Service: General;  Laterality: Right;   RE-EXCISION OF BREAST LUMPECTOMY Right 07/31/2015   Procedure: RE-EXCISION OF RIGHT BREAST LUMPECTOMY;  Surgeon: Erroll Luna,  MD;  Location: [facility];  Service: General;  Laterality: Right;   TONSILLECTOMY  1953    reports that she has never smoked. She has never used smokeless tobacco. She reports that she does not drink alcohol and does not use drugs. family history includes Cancer in her mother; Diabetes in her brother and sister; Heart disease (age of onset: 60) in her father. Allergies  Allergen Reactions   Percocet [Oxycodone-Acetaminophen] Nausea And Vomiting   Acetaminophen Nausea And Vomiting and Other (See Comments)    Allergic to Tylenol #3   Actos [Pioglitazone Hydrochloride] Hives   Latex Swelling and Other (See Comments)    Lip swelling   Lisinopril Cough   Shrimp [Shellfish Allergy] Itching   Zocor [Simvastatin - High Dose] Itching   Penicillins Rash and Other (See Comments)    Has patient had a PCN reaction causing immediate rash, facial/tongue/throat swelling, SOB or lightheadedness with hypotension: yes Has patient had a PCN reaction causing severe rash involving mucus membranes or skin necrosis: no Has patient had a PCN reaction that required hospitalization no Has patient had a PCN reaction occurring within the last 10 years: no If all of the above answers are "NO", then may proceed with Cephalosporin use.     Review of Systems  Constitutional:  Negative for malaise/fatigue.  Eyes:  Negative for blurred vision.  Respiratory:  Negative for shortness of breath.   Cardiovascular:  Negative for chest pain.  Gastrointestinal:  Negative for abdominal  pain.  Musculoskeletal:  Positive for joint pain.  Neurological:  Negative for dizziness, weakness and headaches.      Objective:     BP 126/62 (BP Location: Left Arm, Patient Position: Sitting, Cuff Size: Normal)   Pulse 72   Temp 97.6 F (36.4 C) (Oral)   Ht '5\' 1"'$  (1.549 m)   Wt 172 lb 9.6 oz (78.3 kg)   SpO2 98%   BMI 32.61 kg/m    Physical Exam Constitutional:      Appearance: She is well-developed.  Eyes:     Pupils: Pupils  are equal, round, and reactive to light.  Neck:     Thyroid: No thyromegaly.     Vascular: No JVD.  Cardiovascular:     Rate and Rhythm: Normal rate and regular rhythm.     Heart sounds:     No gallop.  Pulmonary:     Effort: Pulmonary effort is normal. No respiratory distress.     Breath sounds: Normal breath sounds. No wheezing or rales.  Musculoskeletal:     Cervical back: Neck supple.     Right lower leg: No edema.     Left lower leg: No edema.  Neurological:     Mental Status: She is alert.      Results for orders placed or performed in visit on 08/31/21  POCT glycosylated hemoglobin (Hb A1C)  Result Value Ref Range   Hemoglobin A1C 6.7 (A) 4.0 - 5.6 %   HbA1c POC (<> result, manual entry)     HbA1c, POC (prediabetic range)     HbA1c, POC (controlled diabetic range)        The ASCVD Risk score (Arnett DK, et al., 2019) failed to calculate for the following reasons:   The 2019 ASCVD risk score is only valid for ages 19 to 11    Assessment & Plan:   #1 type 2 diabetes.  Well-controlled with A1c today 6.7%.  We did discuss change from glimepiride to glipizide for shorter half-life to reduce risk of hypoglycemia.  We will stop glimepiride and start glipizide 5 mg once daily.  Also refilled metformin for 1 year  #2 hypertension stable and well controlled.  Increase potassium rich foods with handout given.  Refill losartan HCTZ for 1 year  #3 hyperlipidemia.  Well-controlled on recent labs.  Refill atorvastatin for 1 year.  Recheck lipids in 6 months  #4 history of breast cancer.  She had repeat mammogram just last week.  Still followed by oncology   Return in about 6 months (around 03/03/2022).    Carolann Littler, MD

## 2021-10-30 ENCOUNTER — Other Ambulatory Visit: Payer: Self-pay | Admitting: Family Medicine

## 2021-11-15 ENCOUNTER — Other Ambulatory Visit: Payer: Self-pay | Admitting: Hematology and Oncology

## 2021-11-15 ENCOUNTER — Telehealth: Payer: Self-pay | Admitting: Family Medicine

## 2021-11-15 NOTE — Telephone Encounter (Signed)
Patient informed to contact [facility] to have rx filled

## 2021-11-15 NOTE — Telephone Encounter (Signed)
Pt has been out of metformin for 1 wk. [facility]   sun mfr brand metFORMIN (GLUCOPHAGE-XR) 500 MG 24 hr tablet [id number] [place], [place] - [id number]  Please advise

## 2021-11-16 NOTE — Telephone Encounter (Signed)
Mykal please call this patient back concerning medication

## 2021-11-16 NOTE — Telephone Encounter (Signed)
Patient informed to contact pharmacy in regard to medication.

## 2022-02-15 ENCOUNTER — Telehealth: Payer: Self-pay

## 2022-02-15 NOTE — Telephone Encounter (Signed)
---  Caller states she has been on Glipizide '5mg'$  since July for Diabetes. She is experiencing dry skin and dry scalp, has little red spots on her scalp and has seen the dermatologist for the scalp dryness and has a cream to put on the dryness. N[employer]oral medical shampoo helps. Worse dryness since heater is on. Denies fever or any other symptoms. Her dermatologist stated she should call him and let him know. Has an appointment January 12.  02/11/2022 12:24:12 PM See PCP within 2 [place], RN, Marcie Bal

## 2022-03-04 ENCOUNTER — Ambulatory Visit: Payer: 59 | Admitting: Family Medicine

## 2022-03-04 ENCOUNTER — Encounter: Payer: Self-pay | Admitting: Family Medicine

## 2022-03-04 VITALS — BP 138/70 | HR 75 | Temp 97.9°F | Ht 61.0 in | Wt 166.0 lb

## 2022-03-04 DIAGNOSIS — I10 Essential (primary) hypertension: Secondary | ICD-10-CM | POA: Diagnosis not present

## 2022-03-04 DIAGNOSIS — E783 Hyperchylomicronemia: Secondary | ICD-10-CM | POA: Diagnosis not present

## 2022-03-04 DIAGNOSIS — E1165 Type 2 diabetes mellitus with hyperglycemia: Secondary | ICD-10-CM | POA: Diagnosis not present

## 2022-03-04 LAB — POCT GLYCOSYLATED HEMOGLOBIN (HGB A1C): Hemoglobin A1C: 6.8 % — AB (ref 4.0–5.6)

## 2022-03-04 LAB — HEPATIC FUNCTION PANEL
ALT: 9 U/L (ref 0–35)
AST: 14 U/L (ref 0–37)
Albumin: 4 g/dL (ref 3.5–5.2)
Alkaline Phosphatase: 67 U/L (ref 39–117)
Bilirubin, Direct: 0.3 mg/dL (ref 0.0–0.3)
Total Bilirubin: 1.2 mg/dL (ref 0.2–1.2)
Total Protein: 6.6 g/dL (ref 6.0–8.3)

## 2022-03-04 LAB — LIPID PANEL
Cholesterol: 105 mg/dL (ref 0–200)
HDL: 60.8 mg/dL (ref >39.00)
LDL Cholesterol: 33 mg/dL (ref 0–99)
NonHDL: 43.84
Total CHOL/HDL Ratio: 2
Triglycerides: 55 mg/dL (ref 0.0–149.0)
VLDL: 11 mg/dL (ref 0.0–40.0)

## 2022-03-04 LAB — BASIC METABOLIC PANEL
BUN: 14 mg/dL (ref 6–23)
CO2: 29 mEq/L (ref 19–32)
Calcium: 9 mg/dL (ref 8.4–10.5)
Chloride: 103 mEq/L (ref 96–112)
Creatinine, Ser: 0.68 mg/dL (ref 0.40–1.20)
GFR: 79.09 mL/min (ref >60.00)
Glucose, Bld: 198 mg/dL — ABNORMAL HIGH (ref 70–99)
Potassium: 3.5 mEq/L (ref 3.5–5.1)
Sodium: 139 mEq/L (ref 135–145)

## 2022-03-04 NOTE — Progress Notes (Signed)
Established Patient Office Visit  Subjective   Patient ID: [name] [name], female    DOB: 06-21-1934  Age: 88 y.o. MRN: [id number]  Chief Complaint  Patient presents with   Follow-up    HPI   [name] [name] is seen for medical follow-up.  She has history of breast cancer, hyperlipidemia, type 2 diabetes, hypertension.  Her medications reviewed and include Arimidex, Lipitor 20 mg daily, glipizide 5 mg daily, losartan HCTZ 50/12.5 mg daily, metformin extended release 500 mg daily.  She is generally doing well.  She has been recently seen dermatologist for skin rash and was diagnosed with some type of psoriasis.  She denies any recent hypoglycemic symptoms.  Does not monitor blood sugars regularly.  She has not had evidence for prior pneumonia vaccine and declines at this time.  She recently noticed small nodule left lower lateral leg.  Occasionally tender to touch.  Past Medical History:  Diagnosis Date   Arthritis    Breast cancer ([place]) 2017   Right Breast Cancer   Breast cancer of upper-outer quadrant of right female breast ([place]) 06/04/2015   Chronic kidney disease    stones   Diabetes mellitus    Hyperlipidemia    Hypertension    Personal history of radiation therapy 2017   Right Breast Cancer   PONV (postoperative nausea and vomiting)    UTI (urinary tract infection)    Past Surgical History:  Procedure Laterality Date   ABDOMINAL HYSTERECTOMY  1980   BREAST BIOPSY Right 2018/6   benign   BREAST LUMPECTOMY Right 2017   CATARACT EXTRACTION     CHOLECYSTECTOMY  1989   COLONOSCOPY     RADIOACTIVE SEED GUIDED PARTIAL MASTECTOMY WITH AXILLARY SENTINEL LYMPH NODE BIOPSY Right 07/09/2015   Procedure: RADIOACTIVE SEED GUIDED PARTIAL MASTECTOMY WITH AXILLARY SENTINEL LYMPH NODE BIOPSY;  Surgeon: Erroll Luna, MD;  Location: [facility];  Service: General;  Laterality: Right;   RE-EXCISION OF BREAST LUMPECTOMY Right 07/31/2015   Procedure: RE-EXCISION OF RIGHT BREAST  LUMPECTOMY;  Surgeon: Erroll Luna, MD;  Location: [facility];  Service: General;  Laterality: Right;   TONSILLECTOMY  1953    reports that she has never smoked. She has never used smokeless tobacco. She reports that she does not drink alcohol and does not use drugs. family history includes Cancer in her mother; Diabetes in her brother and sister; Heart disease (age of onset: 42) in her father. Allergies  Allergen Reactions   Percocet [Oxycodone-Acetaminophen] Nausea And Vomiting   Acetaminophen Nausea And Vomiting and Other (See Comments)    Allergic to Tylenol #3   Actos [Pioglitazone Hydrochloride] Hives   Latex Swelling and Other (See Comments)    Lip swelling   Lisinopril Cough   Shrimp [Shellfish Allergy] Itching   Zocor [Simvastatin - High Dose] Itching   Penicillins Rash and Other (See Comments)    Has patient had a PCN reaction causing immediate rash, facial/tongue/throat swelling, SOB or lightheadedness with hypotension: yes Has patient had a PCN reaction causing severe rash involving mucus membranes or skin necrosis: no Has patient had a PCN reaction that required hospitalization no Has patient had a PCN reaction occurring within the last 10 years: no If all of the above answers are "NO", then may proceed with Cephalosporin use.     Review of Systems  Constitutional:  Negative for malaise/fatigue.  Eyes:  Negative for blurred vision.  Respiratory:  Negative for shortness of breath.   Cardiovascular:  Negative for chest  pain.  Gastrointestinal:  Negative for abdominal pain.  Neurological:  Negative for dizziness, weakness and headaches.      Objective:     BP 138/70 (BP Location: Left Arm, Patient Position: Sitting, Cuff Size: Normal)   Pulse 75   Temp 97.9 F (36.6 C) (Oral)   Ht '5\' 1"'$  (1.549 m)   Wt 166 lb (75.3 kg)   SpO2 98%   BMI 31.37 kg/m    Physical Exam Constitutional:      Appearance: She is well-developed.  Eyes:     Pupils: Pupils are equal,  round, and reactive to light.  Neck:     Thyroid: No thyromegaly.     Vascular: No JVD.  Cardiovascular:     Rate and Rhythm: Normal rate and regular rhythm.     Heart sounds:     No gallop.  Pulmonary:     Effort: Pulmonary effort is normal. No respiratory distress.     Breath sounds: Normal breath sounds. No wheezing or rales.  Musculoskeletal:     Cervical back: Neck supple.  Skin:    Comments: Small approximately 4 mm fibromatous lesion left lower lateral leg.  No atypical features.  Neurological:     Mental Status: She is alert.      Results for orders placed or performed in visit on 03/04/22  POCT glycosylated hemoglobin (Hb A1C)  Result Value Ref Range   Hemoglobin A1C 6.8 (A) 4.0 - 5.6 %   HbA1c POC (<> result, manual entry)     HbA1c, POC (prediabetic range)     HbA1c, POC (controlled diabetic range)        The ASCVD Risk score (Arnett DK, et al., 2019) failed to calculate for the following reasons:   The 2019 ASCVD risk score is only valid for ages 38 to 72    Assessment & Plan:   Problem List Items Addressed This Visit       Unprioritized   Type 2 diabetes mellitus ([place]) - Primary   Relevant Orders   POCT glycosylated hemoglobin (Hb A1C) (Completed)   Hypertension   Relevant Orders   Basic metabolic panel   Hyperlipidemia   Relevant Orders   Lipid panel   Hepatic function panel  Patient has benign dermatofibroma left lower leg.  Reassurance given.  -We encouraged her to consider Prevnar 20 but she declines  -A1c today 6.8%.  Continue metformin extended release and glipizide.  Reassess in 6 months.  Continue regular diabetic eye exam  -Continue current medications above including Lipitor 20 mg daily, metformin extended release 500 mg daily, losartan HCTZ 50/12.5 mg 1 daily, and glipizide 5 mg daily  -Set up 22-monthfollow-up  -Continue yearly diabetic eye exam  Return in about 6 months (around 09/02/2022).    BCarolann Littler MD

## 2022-03-04 NOTE — Patient Instructions (Signed)
Suspect benign dermatofibroma of left lower leg.    A1C today is controlled at 6.8%.

## 2022-07-06 ENCOUNTER — Other Ambulatory Visit: Payer: Self-pay | Admitting: Family Medicine

## 2022-08-01 ENCOUNTER — Other Ambulatory Visit: Payer: Self-pay | Admitting: Family Medicine

## 2022-08-01 DIAGNOSIS — Z1231 Encounter for screening mammogram for malignant neoplasm of breast: Secondary | ICD-10-CM

## 2022-08-22 ENCOUNTER — Ambulatory Visit
Admission: RE | Admit: 2022-08-22 | Discharge: 2022-08-22 | Disposition: A | Discharge status: Home / Self Care | Payer: 59 | Source: Ambulatory Visit | Attending: Family Medicine | Admitting: Family Medicine

## 2022-08-22 DIAGNOSIS — Z1231 Encounter for screening mammogram for malignant neoplasm of breast: Secondary | ICD-10-CM

## 2022-09-02 ENCOUNTER — Ambulatory Visit: Payer: 59 | Admitting: Family Medicine

## 2022-09-02 ENCOUNTER — Encounter: Payer: Self-pay | Admitting: Family Medicine

## 2022-09-02 VITALS — BP 120/60 | HR 67 | Temp 98.3°F | Ht 61.0 in | Wt 166.3 lb

## 2022-09-02 DIAGNOSIS — E783 Hyperchylomicronemia: Secondary | ICD-10-CM

## 2022-09-02 DIAGNOSIS — I10 Essential (primary) hypertension: Secondary | ICD-10-CM

## 2022-09-02 DIAGNOSIS — E1165 Type 2 diabetes mellitus with hyperglycemia: Secondary | ICD-10-CM | POA: Diagnosis not present

## 2022-09-02 DIAGNOSIS — Z170 Estrogen receptor positive status [ER+]: Secondary | ICD-10-CM

## 2022-09-02 DIAGNOSIS — C50411 Malignant neoplasm of upper-outer quadrant of right female breast: Secondary | ICD-10-CM | POA: Diagnosis not present

## 2022-09-02 DIAGNOSIS — Z7984 Long term (current) use of oral hypoglycemic drugs: Secondary | ICD-10-CM | POA: Diagnosis not present

## 2022-09-02 LAB — MICROALBUMIN / CREATININE URINE RATIO
Creatinine,U: 53.7 mg/dL
Microalb Creat Ratio: 9.1 mg/g (ref 0.0–30.0)
Microalb, Ur: 4.9 mg/dL — ABNORMAL HIGH (ref 0.0–1.9)

## 2022-09-02 LAB — POCT GLYCOSYLATED HEMOGLOBIN (HGB A1C): Hemoglobin A1C: 6.7 % — AB (ref 4.0–5.6)

## 2022-09-02 NOTE — Patient Instructions (Signed)
A1C today is 6.7% which is good  Keep up the good work.

## 2022-09-02 NOTE — Progress Notes (Signed)
Established Patient Office Visit  Subjective   Patient ID: [name] [name], female    DOB: [birth year]  Age: 88 y.o. MRN: [id number]  No chief complaint on file.   HPI   [name] [name] is seen for medical follow-up.  Generally doing well.  No specific complaints today.  She has history of type 2 diabetes, history of breast cancer, hyperlipidemia, hypertension.  Medications reviewed.  Takes glipizide and metformin for diabetes.  A1c's have consistently been well-controlled.  Denies any recent hypoglycemic symptoms.  She is on atorvastatin 20 mg daily for hyperlipidemia.  She takes losartan HCTZ for hypertension.  No recent dizziness.  No recent falls.  No chest pains.  Compliant with medications.  She is getting diabetic eye exams yearly.  Past Medical History:  Diagnosis Date   Arthritis    Breast cancer (HCC) 2017   Right Breast Cancer   Breast cancer of upper-outer quadrant of right female breast (HCC) 06/04/2015   Chronic kidney disease    stones   Diabetes mellitus    Hyperlipidemia    Hypertension    Personal history of radiation therapy 2017   Right Breast Cancer   PONV (postoperative nausea and vomiting)    UTI (urinary tract infection)    Past Surgical History:  Procedure Laterality Date   ABDOMINAL HYSTERECTOMY  1980   BREAST BIOPSY Right 2018/6   benign   BREAST LUMPECTOMY Right 2017   CATARACT EXTRACTION     CHOLECYSTECTOMY  1989   COLONOSCOPY     RADIOACTIVE SEED GUIDED PARTIAL MASTECTOMY WITH AXILLARY SENTINEL LYMPH NODE BIOPSY Right 07/09/2015   Procedure: RADIOACTIVE SEED GUIDED PARTIAL MASTECTOMY WITH AXILLARY SENTINEL LYMPH NODE BIOPSY;  Surgeon: Harriette Bouillon, MD;  Location: [facility];  Service: General;  Laterality: Right;   RE-EXCISION OF BREAST LUMPECTOMY Right 07/31/2015   Procedure: RE-EXCISION OF RIGHT BREAST LUMPECTOMY;  Surgeon: Harriette Bouillon, MD;  Location: [facility];  Service: General;  Laterality: Right;   TONSILLECTOMY  1953    reports  that she has never smoked. She has never used smokeless tobacco. She reports that she does not drink alcohol and does not use drugs. family history includes Cancer in her mother; Diabetes in her brother and sister; Heart disease (age of onset: 15) in her father. Allergies  Allergen Reactions   Percocet [Oxycodone-Acetaminophen] Nausea And Vomiting   Acetaminophen Nausea And Vomiting and Other (See Comments)    Allergic to Tylenol #3   Actos [Pioglitazone Hydrochloride] Hives   Latex Swelling and Other (See Comments)    Lip swelling   Lisinopril Cough   Shrimp [Shellfish Allergy] Itching   Zocor [Simvastatin - High Dose] Itching   Penicillins Rash and Other (See Comments)    Has patient had a PCN reaction causing immediate rash, facial/tongue/throat swelling, SOB or lightheadedness with hypotension: yes Has patient had a PCN reaction causing severe rash involving mucus membranes or skin necrosis: no Has patient had a PCN reaction that required hospitalization no Has patient had a PCN reaction occurring within the last 10 years: no If all of the above answers are "NO", then may proceed with Cephalosporin use.     Review of Systems  Constitutional:  Negative for malaise/fatigue.  Eyes:  Negative for blurred vision.  Respiratory:  Negative for shortness of breath.   Cardiovascular:  Negative for chest pain.  Neurological:  Negative for dizziness, weakness and headaches.      Objective:     BP 120/60 (BP Location: Left  Arm, Patient Position: Sitting, Cuff Size: Normal)   Pulse 67   Temp 98.3 F (36.8 C) (Oral)   Ht 5\' 1"  (1.549 m)   Wt 166 lb 4.8 oz (75.4 kg)   SpO2 98%   BMI 31.42 kg/m  BP Readings from Last 3 Encounters:  09/02/22 120/60  03/04/22 138/70  08/31/21 126/62   Wt Readings from Last 3 Encounters:  09/02/22 166 lb 4.8 oz (75.4 kg)  03/04/22 166 lb (75.3 kg)  08/31/21 172 lb 9.6 oz (78.3 kg)      Physical Exam Vitals reviewed.  Constitutional:       Appearance: She is well-developed.  Eyes:     Pupils: Pupils are equal, round, and reactive to light.  Neck:     Thyroid: No thyromegaly.     Vascular: No JVD.  Cardiovascular:     Rate and Rhythm: Normal rate and regular rhythm.     Heart sounds:     No gallop.  Pulmonary:     Effort: Pulmonary effort is normal. No respiratory distress.     Breath sounds: Normal breath sounds. No wheezing or rales.  Musculoskeletal:     Cervical back: Neck supple.  Skin:    Comments: Feet are warm to touch.  Good capillary refill and good distal pulses.  No foot lesions.  She does have bilateral bunions-but no significant callus.  Normal sensory function in both feet  Neurological:     Mental Status: She is alert.      Results for orders placed or performed in visit on 09/02/22  POC HgB A1c  Result Value Ref Range   Hemoglobin A1C 6.7 (A) 4.0 - 5.6 %   HbA1c POC (<> result, manual entry)     HbA1c, POC (prediabetic range)     HbA1c, POC (controlled diabetic range)      Last CBC Lab Results  Component Value Date   WBC 3.6 (L) 03/03/2021   HGB 11.8 (L) 03/03/2021   HCT 36.4 03/03/2021   MCV 94.1 03/03/2021   MCH 30.3 07/31/2015   RDW 14.3 03/03/2021   PLT 181.0 03/03/2021   Last metabolic panel Lab Results  Component Value Date   GLUCOSE 198 (H) 03/04/2022   NA 139 03/04/2022   K 3.5 03/04/2022   CL 103 03/04/2022   CO2 29 03/04/2022   BUN 14 03/04/2022   CREATININE 0.68 03/04/2022   GFR 79.09 03/04/2022   CALCIUM 9.0 03/04/2022   PROT 6.6 03/04/2022   ALBUMIN 4.0 03/04/2022   BILITOT 1.2 03/04/2022   ALKPHOS 67 03/04/2022   AST 14 03/04/2022   ALT 9 03/04/2022   ANIONGAP 7 07/31/2015   Last lipids Lab Results  Component Value Date   CHOL 105 03/04/2022   HDL 60.80 03/04/2022   LDLCALC 33 03/04/2022   TRIG 55.0 03/04/2022   CHOLHDL 2 03/04/2022   Last hemoglobin A1c Lab Results  Component Value Date   HGBA1C 6.7 (A) 09/02/2022      The ASCVD Risk score  (Arnett DK, et al., 2019) failed to calculate for the following reasons:   The 2019 ASCVD risk score is only valid for ages 57 to 69    Assessment & Plan:   #1 type 2 diabetes well-controlled with A1c 6.7% which is stable.  Needs urine microalbumin screen and that will be checked today.  Continue diabetic eye exam once yearly.  Set up follow-up in 6 months.  #2 hypertension well-controlled on losartan HCTZ.  Continue current medication.  Check basic metabolic panel at follow-up.  #3 hyperlipidemia which has been controlled well in the past with atorvastatin 20 mg daily.  Last LDL cholesterol 33.  #4 history of breast cancer.  She has had some recent increased hand and joint pains and has been on anastrozole apparently for over 5 years.  She will discuss with oncologist regarding possibly coming off this   Return in about 6 months (around 03/05/2023).    Evelena Peat, MD

## 2022-10-11 ENCOUNTER — Other Ambulatory Visit: Payer: Self-pay | Admitting: Family Medicine

## 2022-11-09 ENCOUNTER — Other Ambulatory Visit: Payer: Self-pay | Admitting: Family Medicine

## 2022-11-09 ENCOUNTER — Other Ambulatory Visit: Payer: Self-pay | Admitting: Hematology and Oncology

## 2022-11-14 ENCOUNTER — Telehealth: Payer: Self-pay

## 2022-11-14 DIAGNOSIS — C50411 Malignant neoplasm of upper-outer quadrant of right female breast: Secondary | ICD-10-CM

## 2022-11-14 DIAGNOSIS — M810 Age-related osteoporosis without current pathological fracture: Secondary | ICD-10-CM

## 2022-11-14 NOTE — Telephone Encounter (Signed)
Called [name] [name] as we received refill request for Anastrozole. She has not seen Dr Pamelia Hoit since 10/2020. She is in need of f/u bone density test as well. Per MD, rx for Anastrozole filled for 30 days, as [name] [name] states she still has 2 bottles and will not need refill until November. Order placed for DEXA, but [name] [name] is aware [facility] is behind scheduling DEXA scans. She was provided with number to call and schedule. She will see Dr Pamelia Hoit 12/26/22 for follow up.

## 2022-11-15 ENCOUNTER — Encounter: Payer: Self-pay | Admitting: Hematology and Oncology

## 2022-12-07 ENCOUNTER — Telehealth: Payer: Self-pay

## 2022-12-07 ENCOUNTER — Ambulatory Visit (HOSPITAL_BASED_OUTPATIENT_CLINIC_OR_DEPARTMENT_OTHER)
Admission: RE | Admit: 2022-12-07 | Discharge: 2022-12-07 | Disposition: A | Discharge status: Home / Self Care | Payer: 59 | Source: Ambulatory Visit | Attending: Hematology and Oncology | Admitting: Hematology and Oncology

## 2022-12-07 DIAGNOSIS — Z170 Estrogen receptor positive status [ER+]: Secondary | ICD-10-CM | POA: Diagnosis present

## 2022-12-07 DIAGNOSIS — C50411 Malignant neoplasm of upper-outer quadrant of right female breast: Secondary | ICD-10-CM | POA: Diagnosis present

## 2022-12-07 DIAGNOSIS — M810 Age-related osteoporosis without current pathological fracture: Secondary | ICD-10-CM | POA: Diagnosis present

## 2022-12-07 NOTE — Telephone Encounter (Signed)
-----   Message from Noreene Filbert sent at 12/07/2022  1:17 PM EDT ----- Mild osteopenia, please notify patient, calcium, vitamin D, and weight bearing exercises recommend repeating in 2 years. ----- Message ----- From: Interface, Rad Results In Sent: 12/07/2022  10:46 AM EDT To: Serena Croissant, MD

## 2022-12-07 NOTE — Telephone Encounter (Signed)
Called pt per Np to inform patient about her Mild Osteopenia. Notified patient of calcium vitamin D and weight bearing exercises and repeat bone density in 2 years. Pt verbalized and understanding,.

## 2022-12-23 ENCOUNTER — Other Ambulatory Visit: Payer: Self-pay | Admitting: Hematology and Oncology

## 2022-12-27 ENCOUNTER — Telehealth: Payer: Self-pay | Admitting: Adult Health

## 2022-12-27 NOTE — Telephone Encounter (Signed)
Per scheduling messages on 12/26/2022 left patient a message in regards to scheduled appointment times/dates left callback if needed for rescheduling

## 2022-12-27 NOTE — Telephone Encounter (Signed)
Pt not seen in our office since 2022 and continues to no show or cancel appts.

## 2023-01-17 ENCOUNTER — Ambulatory Visit: Payer: 59 | Admitting: Adult Health

## 2023-02-07 ENCOUNTER — Inpatient Hospital Stay: Payer: 59 | Attending: Hematology and Oncology | Admitting: Hematology and Oncology

## 2023-02-07 VITALS — BP 148/75 | HR 100 | Temp 97.4°F | Resp 18 | Ht 61.0 in | Wt 174.2 lb

## 2023-02-07 DIAGNOSIS — Z170 Estrogen receptor positive status [ER+]: Secondary | ICD-10-CM | POA: Diagnosis not present

## 2023-02-07 DIAGNOSIS — Z79811 Long term (current) use of aromatase inhibitors: Secondary | ICD-10-CM | POA: Diagnosis not present

## 2023-02-07 DIAGNOSIS — C50411 Malignant neoplasm of upper-outer quadrant of right female breast: Secondary | ICD-10-CM | POA: Insufficient documentation

## 2023-02-07 DIAGNOSIS — Z923 Personal history of irradiation: Secondary | ICD-10-CM | POA: Insufficient documentation

## 2023-02-07 NOTE — Progress Notes (Signed)
Patient Care Team: Kristian Covey, MD as PCP - General (Family Medicine) Salomon Fick, NP as Nurse Practitioner (Hematology and Oncology)  DIAGNOSIS:  Encounter Diagnosis  Name Primary?   Malignant neoplasm of upper-outer quadrant of right breast in female, estrogen receptor positive (HCC) Yes    SUMMARY OF ONCOLOGIC HISTORY: Oncology History  Breast cancer of upper-outer quadrant of right female breast (HCC)  06/02/2015 Initial Diagnosis   Right breast biopsy 10:00 position: Invasive lobular cancer with LCIS, ER 95%, PR 80%, Ki-67 15%, HER-2 negative duration 1.61, ultrasound revealed a 1.9 x 1 x 1.4 cm lesion, T1 cN0 stage IA clinical stage   07/09/2015 Surgery   Right lumpectomy: Invasive lobular cancer, grade 2, 1.9 cm, LCIS, ADH, lateral margin focally positive, 0/2 lymph nodes negative, ER 95%, PR 80%, Ki-67 15%, HER-2 negative, T1cN0 stage IA   07/31/2015 Surgery   Reresection surgical margins: Clear, Benign   09/01/2015 - 09/28/2015 Radiation Therapy   Adjuvant radiation therapy   10/22/2015 -  Anti-estrogen oral therapy   Anastrozole 1 mg daily     CHIEF COMPLIANT: F?U on anastrozole  HISTORY OF PRESENT ILLNESS:   History of Present Illness   The patient, an 88 year old with a history of breast cancer, presents for a routine follow-up. She reports overall good health and remains active, walking regularly and maintaining a daily routine. She recently stopped taking anastrozole, a breast cancer prevention medication, due to side effects including hot flashes and finger cramping. She also noticed hair thinning, which she initially attributed to psoriasis, but now suspects may be a side effect of the medication. She reports feeling significantly better since discontinuing the medication. The patient also has a history of psoriasis, which she believes may be contributing to her hair loss.         ALLERGIES:  is allergic to percocet [oxycodone-acetaminophen],  acetaminophen, actos [pioglitazone hydrochloride], latex, lisinopril, shrimp [shellfish allergy], zocor [simvastatin - high dose], and penicillins.  MEDICATIONS:  Current Outpatient Medications  Medication Sig Dispense Refill   ACCU-CHEK GUIDE test strip USE TO CHECK BLOOD SUGAR TWICE DAILY 100 each 3   atorvastatin (LIPITOR) 20 MG tablet TAKE 1 TABLET BY MOUTH DAILY AT 6 PM. 90 tablet 1   Blood Glucose Monitoring Suppl (ONE TOUCH ULTRA SYSTEM KIT) w/Device KIT Use as directed. DX: E11.65. 1 each 0   glipiZIDE (GLUCOTROL) 5 MG tablet TAKE 1 TABLET BY MOUTH DAILY BEFORE BREAKFAST. 90 tablet 2   Glucosamine-Chondroitin (OSTEO BI-FLEX REGULAR STRENGTH PO) Take 1 tablet by mouth daily as needed (For joint health.). Reported on 08/17/2015     latanoprost (XALATAN) 0.005 % ophthalmic solution Place 1 drop into both eyes at bedtime.  4   losartan-hydrochlorothiazide (HYZAAR) 50-12.5 MG tablet TAKE 1 TABLET BY MOUTH EVERY DAY 90 tablet 1   metFORMIN (GLUCOPHAGE-XR) 500 MG 24 hr tablet TAKE 1 TABLET BY MOUTH TWICE A DAY 180 tablet 1   ONE TOUCH LANCETS MISC Check Blood Sugars twice per day. DX: E11.65 100 each 5   timolol (TIMOPTIC) 0.5 % ophthalmic solution INSTILL 1 DROP INTO BOTH EYES EVERY DAY     No current facility-administered medications for this visit.    PHYSICAL EXAMINATION: ECOG PERFORMANCE STATUS: 1 - Symptomatic but completely ambulatory  Vitals:   02/07/23 1447  BP: (!) 148/75  Pulse: 100  Resp: 18  Temp: (!) 97.4 F (36.3 C)  SpO2: 100%   Filed Weights   02/07/23 1447  Weight: 174 lb 3.2 oz (79  kg)      LABORATORY DATA:  I have reviewed the data as listed    Latest Ref Rng & Units 03/04/2022    8:35 AM 03/03/2021    7:26 AM 03/02/2020   11:03 AM  CMP  Glucose 70 - 99 mg/dL 098  119  147   BUN 6 - 23 mg/dL 14  12  10    Creatinine 0.40 - 1.20 mg/dL 8.29  5.62  1.30   Sodium 135 - 145 mEq/L 139  140  139   Potassium 3.5 - 5.1 mEq/L 3.5  3.4  3.7   Chloride 96 - 112  mEq/L 103  104  104   CO2 19 - 32 mEq/L 29  30  31    Calcium 8.4 - 10.5 mg/dL 9.0  9.3  9.2   Total Protein 6.0 - 8.3 g/dL 6.6  6.6  6.4   Total Bilirubin 0.2 - 1.2 mg/dL 1.2  1.3  1.1   Alkaline Phos 39 - 117 U/L 67  78  73   AST 0 - 37 U/L 14  17  14    ALT 0 - 35 U/L 9  11  11      Lab Results  Component Value Date   WBC 3.6 (L) 03/03/2021   HGB 11.8 (L) 03/03/2021   HCT 36.4 03/03/2021   MCV 94.1 03/03/2021   PLT 181.0 03/03/2021   NEUTROABS 1.9 03/03/2021    ASSESSMENT & PLAN:  Breast cancer of upper-outer quadrant of right female breast (HCC) Right lumpectomy 07/09/2015: Invasive lobular cancer, grade 2, 1.9 cm, LCIS, ADH, lateral margin focally positive, 0/2 lymph nodes negative, ER 95%, PR 80%, Ki-67 15%, HER-2 negative, T1cN0 stage IA   Adjuvant radiation therapy 09/01/2015 to 09/28/2015   Treatment plan: Adjuvant antiestrogen therapy with anastrozole 1 mg started 10/12/2020 Anastrozole toxicities: hot flashes and joint pains  Breast cancer surveillance: 1.Breast exam 02/07/2023: Benign 2. Mammogram 08/24/2022: Benign, breast density category B 3. Bone density 12/07/2022: T score - 1.2: Mild osteopenia  Return to clinic in 1 year for follow-up ------------------------------------- Assessment and Plan    Breast Cancer Prevention Patient self-discontinued Anastrozole due to side effects including hot flashes and joint stiffness. She has completed 2.5 years of therapy. -Discontinue Anastrozole as patient has completed a reasonable duration of therapy and is experiencing side effects.  General Health Maintenance Patient is 7.5 years from initial breast cancer diagnosis and is doing well. -Continue annual mammograms as long as patient remains functional and has a life expectancy of at least 5 years. -As needed follow-up appointments, patient to contact office if any issues arise.          No orders of the defined types were placed in this encounter.  The patient  has a good understanding of the overall plan. she agrees with it. she will call with any problems that may develop before the next visit here. Total time spent: 30 mins including face to face time and time spent for planning, charting and co-ordination of care   Tamsen Meek, MD 02/07/23

## 2023-02-07 NOTE — Assessment & Plan Note (Signed)
Right lumpectomy 07/09/2015: Invasive lobular cancer, grade 2, 1.9 cm, LCIS, ADH, lateral margin focally positive, 0/2 lymph nodes negative, ER 95%, PR 80%, Ki-67 15%, HER-2 negative, T1cN0 stage IA   Adjuvant radiation therapy 09/01/2015 to 09/28/2015   Treatment plan: Adjuvant antiestrogen therapy with anastrozole 1 mg started 10/12/2020 Anastrozole toxicities:  Breast cancer surveillance: 1.Breast exam 02/07/2023: Benign 2. Mammogram 08/24/2022: Benign, breast density category B 3. Bone density 12/07/2022: T score - 1.2: Mild osteopenia  Return to clinic in 1 year for follow-up

## 2023-03-06 ENCOUNTER — Ambulatory Visit (INDEPENDENT_AMBULATORY_CARE_PROVIDER_SITE_OTHER): Payer: 59 | Admitting: Family Medicine

## 2023-03-06 ENCOUNTER — Encounter: Payer: Self-pay | Admitting: Family Medicine

## 2023-03-06 VITALS — BP 158/80 | HR 70 | Temp 97.9°F | Wt 173.7 lb

## 2023-03-06 DIAGNOSIS — I10 Essential (primary) hypertension: Secondary | ICD-10-CM | POA: Diagnosis not present

## 2023-03-06 DIAGNOSIS — E785 Hyperlipidemia, unspecified: Secondary | ICD-10-CM

## 2023-03-06 DIAGNOSIS — Z7984 Long term (current) use of oral hypoglycemic drugs: Secondary | ICD-10-CM

## 2023-03-06 DIAGNOSIS — E1165 Type 2 diabetes mellitus with hyperglycemia: Secondary | ICD-10-CM

## 2023-03-06 DIAGNOSIS — E1169 Type 2 diabetes mellitus with other specified complication: Secondary | ICD-10-CM | POA: Diagnosis not present

## 2023-03-06 LAB — POCT GLYCOSYLATED HEMOGLOBIN (HGB A1C): Hemoglobin A1C: 7.2 % — AB (ref 4.0–5.6)

## 2023-03-06 LAB — LIPID PANEL
Cholesterol: 111 mg/dL (ref 0–200)
HDL: 65.7 mg/dL (ref >39.00)
LDL Cholesterol: 36 mg/dL (ref 0–99)
NonHDL: 45.78
Total CHOL/HDL Ratio: 2
Triglycerides: 51 mg/dL (ref 0.0–149.0)
VLDL: 10.2 mg/dL (ref 0.0–40.0)

## 2023-03-06 LAB — HEPATIC FUNCTION PANEL
ALT: 10 U/L (ref 0–35)
AST: 16 U/L (ref 0–37)
Albumin: 3.9 g/dL (ref 3.5–5.2)
Alkaline Phosphatase: 75 U/L (ref 39–117)
Bilirubin, Direct: 0.4 mg/dL — ABNORMAL HIGH (ref 0.0–0.3)
Total Bilirubin: 1.2 mg/dL (ref 0.2–1.2)
Total Protein: 6.4 g/dL (ref 6.0–8.3)

## 2023-03-06 LAB — BASIC METABOLIC PANEL
BUN: 13 mg/dL (ref 6–23)
CO2: 28 meq/L (ref 19–32)
Calcium: 9.2 mg/dL (ref 8.4–10.5)
Chloride: 107 meq/L (ref 96–112)
Creatinine, Ser: 0.64 mg/dL (ref 0.40–1.20)
GFR: 79.69 mL/min (ref >60.00)
Glucose, Bld: 117 mg/dL — ABNORMAL HIGH (ref 70–99)
Potassium: 3.8 meq/L (ref 3.5–5.1)
Sodium: 138 meq/L (ref 135–145)

## 2023-03-06 NOTE — Progress Notes (Signed)
 Established Patient Office Visit  Subjective   Patient ID: [name] [name], female    DOB: [birth year]  Age: 89 y.o. MRN: [id number]  Chief Complaint  Patient presents with   Medical Management of Chronic Issues    HPI   [name] [name] has history of hypertension, type 2 diabetes, osteoarthritis, kidney stones, breast cancer, hyperlipidemia, glaucoma.  She had been taking anastrozole  per oncology was having significant joint aches and was taken off.  She has felt somewhat better since then.  She is generally doing well.  Denies any recent falls.  She has gained some weight about 7 pounds since last visit and admits to poor compliance over the holidays.  Also not walking as much as usual.  A1c's had been steady upper 6 range but up to 7.2% today.  Blood pressure is also up.  Her current medications include atorvastatin  20 mg daily, losartan /HCTZ 50/12.5 mg 1 daily, metformin  extended release 500 mg twice daily, and glipizide  5 mg once daily with breakfast.  She has not been monitoring blood sugars regularly.  She has a home blood pressure cuff but not monitoring recently.  Past Medical History:  Diagnosis Date   Arthritis    Breast cancer (HCC) 2017   Right Breast Cancer   Breast cancer of upper-outer quadrant of right female breast (HCC) 06/04/2015   Chronic kidney disease    stones   Diabetes mellitus    Hyperlipidemia    Hypertension    Personal history of radiation therapy 2017   Right Breast Cancer   PONV (postoperative nausea and vomiting)    UTI (urinary tract infection)    Past Surgical History:  Procedure Laterality Date   ABDOMINAL HYSTERECTOMY  1980   BREAST BIOPSY Right 2018/6   benign   BREAST LUMPECTOMY Right 2017   CATARACT EXTRACTION     CHOLECYSTECTOMY  1989   COLONOSCOPY     RADIOACTIVE SEED GUIDED PARTIAL MASTECTOMY WITH AXILLARY SENTINEL LYMPH NODE BIOPSY Right 07/09/2015   Procedure: RADIOACTIVE SEED GUIDED PARTIAL MASTECTOMY WITH AXILLARY SENTINEL LYMPH NODE  BIOPSY;  Surgeon: Debby Shipper, MD;  Location: [facility];  Service: General;  Laterality: Right;   RE-EXCISION OF BREAST LUMPECTOMY Right 07/31/2015   Procedure: RE-EXCISION OF RIGHT BREAST LUMPECTOMY;  Surgeon: Debby Shipper, MD;  Location: [facility];  Service: General;  Laterality: Right;   TONSILLECTOMY  1953    reports that she has never smoked. She has never used smokeless tobacco. She reports that she does not drink alcohol and does not use drugs. family history includes Cancer in her mother; Diabetes in her brother and sister; Heart disease (age of onset: 34) in her father. Allergies  Allergen Reactions   Percocet [Oxycodone -Acetaminophen ] Nausea And Vomiting   Acetaminophen  Nausea And Vomiting and Other (See Comments)    Allergic to Tylenol  #3   Actos [Pioglitazone Hydrochloride] Hives   Latex Swelling and Other (See Comments)    Lip swelling   Lisinopril Cough   Shrimp [Shellfish Allergy] Itching   Zocor [Simvastatin - High Dose] Itching   Penicillins Rash and Other (See Comments)    Has patient had a PCN reaction causing immediate rash, facial/tongue/throat swelling, SOB or lightheadedness with hypotension: yes Has patient had a PCN reaction causing severe rash involving mucus membranes or skin necrosis: no Has patient had a PCN reaction that required hospitalization no Has patient had a PCN reaction occurring within the last 10 years: no If all of the above answers are NO, then  may proceed with Cephalosporin use.     Review of Systems  Constitutional:  Negative for malaise/fatigue.  Eyes:  Negative for blurred vision.  Respiratory:  Negative for shortness of breath.   Cardiovascular:  Negative for chest pain.  Neurological:  Negative for dizziness, weakness and headaches.      Objective:     BP (!) 158/80 (BP Location: Left Arm, Cuff Size: Normal)   Pulse 70   Temp 97.9 F (36.6 C) (Oral)   Wt 173 lb 11.2 oz (78.8 kg)   SpO2 96%   BMI 32.82 kg/m   BP Readings from Last 3 Encounters:  03/06/23 (!) 158/80  02/07/23 (!) 148/75  09/02/22 120/60   Wt Readings from Last 3 Encounters:  03/06/23 173 lb 11.2 oz (78.8 kg)  02/07/23 174 lb 3.2 oz (79 kg)  09/02/22 166 lb 4.8 oz (75.4 kg)      Physical Exam Vitals reviewed.  Constitutional:      General: She is not in acute distress.    Appearance: She is well-developed.  Eyes:     Pupils: Pupils are equal, round, and reactive to light.  Neck:     Thyroid : No thyromegaly.     Vascular: No JVD.  Cardiovascular:     Rate and Rhythm: Normal rate and regular rhythm.     Heart sounds:     No gallop.  Pulmonary:     Effort: Pulmonary effort is normal. No respiratory distress.     Breath sounds: Normal breath sounds. No wheezing or rales.  Musculoskeletal:     Cervical back: Neck supple.     Right lower leg: No edema.     Left lower leg: No edema.  Neurological:     Mental Status: She is alert.      Results for orders placed or performed in visit on 03/06/23  POC HgB A1c  Result Value Ref Range   Hemoglobin A1C 7.2 (A) 4.0 - 5.6 %   HbA1c POC (<> result, manual entry)     HbA1c, POC (prediabetic range)     HbA1c, POC (controlled diabetic range)      Last CBC Lab Results  Component Value Date   WBC 3.6 (L) 03/03/2021   HGB 11.8 (L) 03/03/2021   HCT 36.4 03/03/2021   MCV 94.1 03/03/2021   MCH 30.3 07/31/2015   RDW 14.3 03/03/2021   PLT 181.0 03/03/2021   Last metabolic panel Lab Results  Component Value Date   GLUCOSE 198 (H) 03/04/2022   NA 139 03/04/2022   K 3.5 03/04/2022   CL 103 03/04/2022   CO2 29 03/04/2022   BUN 14 03/04/2022   CREATININE 0.68 03/04/2022   GFR 79.09 03/04/2022   CALCIUM  9.0 03/04/2022   PROT 6.6 03/04/2022   ALBUMIN 4.0 03/04/2022   BILITOT 1.2 03/04/2022   ALKPHOS 67 03/04/2022   AST 14 03/04/2022   ALT 9 03/04/2022   ANIONGAP 7 07/31/2015   Last lipids Lab Results  Component Value Date   CHOL 105 03/04/2022   HDL 60.80  03/04/2022   LDLCALC 33 03/04/2022   TRIG 55.0 03/04/2022   CHOLHDL 2 03/04/2022   Last hemoglobin A1c Lab Results  Component Value Date   HGBA1C 7.2 (A) 03/06/2023      The ASCVD Risk score (Arnett DK, et al., 2019) failed to calculate for the following reasons:   The 2019 ASCVD risk score is only valid for ages 51 to 13    Assessment & Plan:   #  1 type 2 diabetes.  Fair control with A1c today 7.2%.  This is up from 6.7% previously.  She remains on metformin  and glipizide .  We have challenged her to try to lose some weight and tighten up her diet and recheck in 3 months.  Continue with yearly diabetic eye exam.  #2 hypertension.  Generally well-controlled but up today.  Possibly reflects recent weight gain.  Patient reluctant to add additional medication.  She would like to give this 81-month trial of weight loss, sodium restriction, close monitoring of blood pressure.  We have encouraged her to bring her cuff at follow-up visit to compare with ours.  Monitor in the meantime be in touch if consistently over 140 systolic  #3 dyslipidemia treated with atorvastatin .  Recheck lipid and hepatic panel today  We recommended flu vaccine but she declines   Return in about 3 months (around 06/04/2023).    Wolm Scarlet, MD

## 2023-03-06 NOTE — Patient Instructions (Signed)
 Monitor BP at home and be in touch if top number is consistently > 140  Try to lose some weight, as discussed.   Set up 3 month follow up.    A1C was up today at 7.2%

## 2023-03-21 ENCOUNTER — Other Ambulatory Visit: Payer: Self-pay | Admitting: Family Medicine

## 2023-05-08 ENCOUNTER — Other Ambulatory Visit: Payer: Self-pay | Admitting: Family Medicine

## 2023-06-05 ENCOUNTER — Ambulatory Visit: Payer: Self-pay | Admitting: Family Medicine

## 2023-06-19 ENCOUNTER — Ambulatory Visit: Payer: Self-pay | Admitting: Family Medicine

## 2023-06-19 ENCOUNTER — Encounter: Payer: Self-pay | Admitting: Family Medicine

## 2023-06-19 VITALS — BP 152/80 | HR 60 | Temp 98.1°F | Wt 169.1 lb

## 2023-06-19 DIAGNOSIS — E1165 Type 2 diabetes mellitus with hyperglycemia: Secondary | ICD-10-CM | POA: Diagnosis not present

## 2023-06-19 DIAGNOSIS — E783 Hyperchylomicronemia: Secondary | ICD-10-CM

## 2023-06-19 DIAGNOSIS — R448 Other symptoms and signs involving general sensations and perceptions: Secondary | ICD-10-CM

## 2023-06-19 DIAGNOSIS — I10 Essential (primary) hypertension: Secondary | ICD-10-CM

## 2023-06-19 LAB — POCT GLYCOSYLATED HEMOGLOBIN (HGB A1C): Hemoglobin A1C: 7 % — AB (ref 4.0–5.6)

## 2023-06-19 MED ORDER — AMLODIPINE BESYLATE 5 MG PO TABS: 5.0000 mg | ORAL_TABLET | Freq: Every day | ORAL | 5 refills | Status: DC

## 2023-06-19 NOTE — Patient Instructions (Signed)
 A1C today was 7.0  Add the Amlodipine 5 mg once daily.  Set up follow up in 6 weeks.

## 2023-06-19 NOTE — Progress Notes (Signed)
 Established Patient Office Visit  Subjective   Patient ID: [name] [name], female    DOB: Nov 06, 1934  Age: 89 y.o. MRN: [id number]  Chief Complaint  Patient presents with   Medical Management of Chronic Issues    HPI   [name] [name] is here for medical follow-up.  She has history of hypertension, type 2 diabetes, osteoarthritis, kidney stones, history of right breast cancer, hyperlipidemia.  Last A1c was 7.2%.  She is currently managing her diabetes with metformin  extended release 5 mg twice daily and glipizide  5 mg once daily.  Not monitoring blood sugars regularly.  She takes losartan  HCTZ for hypertension.  Recent home blood pressures 140 systolic and 80s diastolic.  She had little bit of numbness involving her tongue just for the past week or so.  No change of mouthwash.  Does take metformin .  Has taken oral B12 supplement occasionally in the past but not regularly.  No paresthesias involving the extremities.  Past Medical History:  Diagnosis Date   Arthritis    Breast cancer (HCC) 2017   Right Breast Cancer   Breast cancer of upper-outer quadrant of right female breast (HCC) 06/04/2015   Chronic kidney disease    stones   Diabetes mellitus    Hyperlipidemia    Hypertension    Personal history of radiation therapy 2017   Right Breast Cancer   PONV (postoperative nausea and vomiting)    UTI (urinary tract infection)    Past Surgical History:  Procedure Laterality Date   ABDOMINAL HYSTERECTOMY  1980   BREAST BIOPSY Right 2018/6   benign   BREAST LUMPECTOMY Right 2017   CATARACT EXTRACTION     CHOLECYSTECTOMY  1989   COLONOSCOPY     RADIOACTIVE SEED GUIDED PARTIAL MASTECTOMY WITH AXILLARY SENTINEL LYMPH NODE BIOPSY Right 07/09/2015   Procedure: RADIOACTIVE SEED GUIDED PARTIAL MASTECTOMY WITH AXILLARY SENTINEL LYMPH NODE BIOPSY;  Surgeon: Sim Dryer, MD;  Location: [facility];  Service: General;  Laterality: Right;   RE-EXCISION OF BREAST LUMPECTOMY Right  07/31/2015   Procedure: RE-EXCISION OF RIGHT BREAST LUMPECTOMY;  Surgeon: Sim Dryer, MD;  Location: [facility];  Service: General;  Laterality: Right;   TONSILLECTOMY  1953    reports that she has never smoked. She has never used smokeless tobacco. She reports that she does not drink alcohol and does not use drugs. family history includes Cancer in her mother; Diabetes in her brother and sister; Heart disease (age of onset: 48) in her father. Allergies  Allergen Reactions   Percocet [Oxycodone -Acetaminophen ] Nausea And Vomiting   Acetaminophen  Nausea And Vomiting and Other (See Comments)    Allergic to Tylenol  #3   Actos [Pioglitazone Hydrochloride] Hives   Latex Swelling and Other (See Comments)    Lip swelling   Lisinopril Cough   Shrimp [Shellfish Allergy] Itching   Zocor [Simvastatin - High Dose] Itching   Penicillins Rash and Other (See Comments)    Has patient had a PCN reaction causing immediate rash, facial/tongue/throat swelling, SOB or lightheadedness with hypotension: yes Has patient had a PCN reaction causing severe rash involving mucus membranes or skin necrosis: no Has patient had a PCN reaction that required hospitalization no Has patient had a PCN reaction occurring within the last 10 years: no If all of the above answers are "NO", then may proceed with Cephalosporin use.     Review of Systems  Constitutional:  Negative for malaise/fatigue.  Eyes:  Negative for blurred vision.  Respiratory:  Negative for  shortness of breath.   Cardiovascular:  Negative for chest pain.  Neurological:  Negative for dizziness, weakness and headaches.      Objective:     BP (!) 152/80 (BP Location: Left Arm, Cuff Size: Normal)   Pulse 60   Temp 98.1 F (36.7 C) (Oral)   Wt 169 lb 1.6 oz (76.7 kg)   SpO2 96%   BMI 31.95 kg/m  BP Readings from Last 3 Encounters:  06/19/23 (!) 152/80  03/06/23 (!) 158/80  02/07/23 (!) 148/75   Wt Readings from Last 3 Encounters:  06/19/23  169 lb 1.6 oz (76.7 kg)  03/06/23 173 lb 11.2 oz (78.8 kg)  02/07/23 174 lb 3.2 oz (79 kg)      Physical Exam Vitals reviewed.  Constitutional:      General: She is not in acute distress.    Appearance: She is well-developed. She is not ill-appearing.  Eyes:     Pupils: Pupils are equal, round, and reactive to light.  Neck:     Thyroid : No thyromegaly.     Vascular: No JVD.  Cardiovascular:     Rate and Rhythm: Normal rate and regular rhythm.     Heart sounds:     No gallop.  Pulmonary:     Effort: Pulmonary effort is normal. No respiratory distress.     Breath sounds: Normal breath sounds. No wheezing or rales.  Musculoskeletal:     Cervical back: Neck supple.     Right lower leg: No edema.     Left lower leg: No edema.  Neurological:     Mental Status: She is alert.      Results for orders placed or performed in visit on 06/19/23  POC HgB A1c  Result Value Ref Range   Hemoglobin A1C 7.0 (A) 4.0 - 5.6 %   HbA1c POC (<> result, manual entry)     HbA1c, POC (prediabetic range)     HbA1c, POC (controlled diabetic range)      Last CBC Lab Results  Component Value Date   WBC 3.6 (L) 03/03/2021   HGB 11.8 (L) 03/03/2021   HCT 36.4 03/03/2021   MCV 94.1 03/03/2021   MCH 30.3 07/31/2015   RDW 14.3 03/03/2021   PLT 181.0 03/03/2021   Last metabolic panel Lab Results  Component Value Date   GLUCOSE 117 (H) 03/06/2023   NA 138 03/06/2023   K 3.8 03/06/2023   CL 107 03/06/2023   CO2 28 03/06/2023   BUN 13 03/06/2023   CREATININE 0.64 03/06/2023   GFR 79.69 03/06/2023   CALCIUM  9.2 03/06/2023   PROT 6.4 03/06/2023   ALBUMIN 3.9 03/06/2023   BILITOT 1.2 03/06/2023   ALKPHOS 75 03/06/2023   AST 16 03/06/2023   ALT 10 03/06/2023   ANIONGAP 7 07/31/2015   Last lipids Lab Results  Component Value Date   CHOL 111 03/06/2023   HDL 65.70 03/06/2023   LDLCALC 36 03/06/2023   TRIG 51.0 03/06/2023   CHOLHDL 2 03/06/2023   Last hemoglobin A1c Lab Results   Component Value Date   HGBA1C 7.0 (A) 06/19/2023   Last thyroid  functions Lab Results  Component Value Date   TSH 4.19 05/19/2014      The ASCVD Risk score (Arnett DK, et al., 2019) failed to calculate for the following reasons:   The 2019 ASCVD risk score is only valid for ages 68 to 11    Assessment & Plan:   #1 type 2 diabetes improved with A1c  today 7.0%.  Continue current medications.  Continue lower glycemic diet.  Reassess in 6 months  #2 hypertension poorly controlled.  We have recommended addition of amlodipine 5 mg daily to her losartan  HCTZ.  Her blood pressure was up similarly last time.  Continue low-sodium diet.  Set up follow-up in 6 weeks to reassess  #3 intermittent mild tongue numbness.  Get back on B12 supplement.  If symptoms persist at follow-up check B12 level  #3 hyperlipidemia treated with atorvastatin  20 mg daily.  Lipids were recently well-controlled at last visit. Return in about 6 weeks (around 07/31/2023).    Glean Lamy, MD

## 2023-07-31 ENCOUNTER — Encounter: Payer: Self-pay | Admitting: Family Medicine

## 2023-07-31 ENCOUNTER — Ambulatory Visit: Admitting: Family Medicine

## 2023-07-31 VITALS — BP 124/62 | HR 83 | Temp 98.0°F | Wt 166.3 lb

## 2023-07-31 DIAGNOSIS — E783 Hyperchylomicronemia: Secondary | ICD-10-CM | POA: Diagnosis not present

## 2023-07-31 DIAGNOSIS — I10 Essential (primary) hypertension: Secondary | ICD-10-CM

## 2023-07-31 DIAGNOSIS — E1165 Type 2 diabetes mellitus with hyperglycemia: Secondary | ICD-10-CM

## 2023-07-31 MED ORDER — ATORVASTATIN CALCIUM 20 MG PO TABS: ORAL_TABLET | ORAL | 1 refills | Status: AC

## 2023-07-31 MED ORDER — AMLODIPINE BESYLATE 5 MG PO TABS: 5.0000 mg | ORAL_TABLET | Freq: Every day | ORAL | 3 refills | Status: DC

## 2023-07-31 MED ORDER — LOSARTAN POTASSIUM-HCTZ 50-12.5 MG PO TABS: 1.0000 | ORAL_TABLET | Freq: Every day | ORAL | 1 refills | Status: DC

## 2023-07-31 MED ORDER — METFORMIN HCL ER 500 MG PO TB24: 500.0000 mg | ORAL_TABLET | Freq: Two times a day (BID) | ORAL | 1 refills | Status: AC

## 2023-07-31 MED ORDER — GLIPIZIDE 5 MG PO TABS: ORAL_TABLET | ORAL | 1 refills | Status: AC

## 2023-07-31 NOTE — Progress Notes (Signed)
 Established Patient Office Visit  Subjective   Patient ID: [name] [name], female    DOB: 1934-10-22  Age: 89 y.o. MRN: [id number]  Chief Complaint  Patient presents with   Medical Management of Chronic Issues    HPI   [name] is seen for medical follow-up.  Refer to last note for details.  She has hypertension and had had several elevated readings including 152/80 last visit.  Home readings had also been elevated over 140 frequently.  We added amlodipine  5 mg daily.  She is tolerating without side effects at this time.  She does have some initial dizziness but this seemed to go away after several days.  She is currently exercising with some walking and exercise stationary bike at home.  Weight is down another few pounds which she attributes to her increased activity.  She had some recent paresthesias of the tongue which have resolved since last visit.  She denies any chest pain with exercise.  Home blood pressures have been consistently well-controlled.  She even had a reading recently of 99 systolic but no associated dizziness.  Diabetes has been well-controlled on metformin .  Recent renal function stable.  Last A1c 7.0%.  She also remains on atorvastatin  for hyperlipidemia.  No myalgias.  She is switching pharmacies and needs all of her usual medications sent to her new pharmacy.  Past Medical History:  Diagnosis Date   Arthritis    Breast cancer (HCC) 2017   Right Breast Cancer   Breast cancer of upper-outer quadrant of right female breast (HCC) 06/04/2015   Chronic kidney disease    stones   Diabetes mellitus    Hyperlipidemia    Hypertension    Personal history of radiation therapy 2017   Right Breast Cancer   PONV (postoperative nausea and vomiting)    UTI (urinary tract infection)    Past Surgical History:  Procedure Laterality Date   ABDOMINAL HYSTERECTOMY  1980   BREAST BIOPSY Right 2018/6   benign   BREAST LUMPECTOMY Right 2017   CATARACT EXTRACTION      CHOLECYSTECTOMY  1989   COLONOSCOPY     RADIOACTIVE SEED GUIDED PARTIAL MASTECTOMY WITH AXILLARY SENTINEL LYMPH NODE BIOPSY Right 07/09/2015   Procedure: RADIOACTIVE SEED GUIDED PARTIAL MASTECTOMY WITH AXILLARY SENTINEL LYMPH NODE BIOPSY;  Surgeon: Sim Dryer, MD;  Location: [facility];  Service: General;  Laterality: Right;   RE-EXCISION OF BREAST LUMPECTOMY Right 07/31/2015   Procedure: RE-EXCISION OF RIGHT BREAST LUMPECTOMY;  Surgeon: Sim Dryer, MD;  Location: [facility];  Service: General;  Laterality: Right;   TONSILLECTOMY  1953    reports that she has never smoked. She has never used smokeless tobacco. She reports that she does not drink alcohol and does not use drugs. family history includes Cancer in her mother; Diabetes in her brother and sister; Heart disease (age of onset: 43) in her father. Allergies  Allergen Reactions   Percocet [Oxycodone -Acetaminophen ] Nausea And Vomiting   Acetaminophen  Nausea And Vomiting and Other (See Comments)    Allergic to Tylenol  #3   Actos [Pioglitazone Hydrochloride] Hives   Latex Swelling and Other (See Comments)    Lip swelling   Lisinopril Cough   Shrimp [Shellfish Allergy] Itching   Zocor [Simvastatin - High Dose] Itching   Penicillins Rash and Other (See Comments)    Has patient had a PCN reaction causing immediate rash, facial/tongue/throat swelling, SOB or lightheadedness with hypotension: yes Has patient had a PCN reaction causing severe rash  involving mucus membranes or skin necrosis: no Has patient had a PCN reaction that required hospitalization no Has patient had a PCN reaction occurring within the last 10 years: no If all of the above answers are "NO", then may proceed with Cephalosporin use.     Review of Systems  Constitutional:  Negative for malaise/fatigue.  Eyes:  Negative for blurred vision.  Respiratory:  Negative for shortness of breath.   Cardiovascular:  Negative for chest pain.  Neurological:   Negative for dizziness, weakness and headaches.      Objective:     BP 124/62 (BP Location: Left Arm, Cuff Size: Normal)   Pulse 83   Temp 98 F (36.7 C) (Oral)   Wt 166 lb 4.8 oz (75.4 kg)   SpO2 96%   BMI 31.42 kg/m  BP Readings from Last 3 Encounters:  07/31/23 124/62  06/19/23 (!) 152/80  03/06/23 (!) 158/80   Wt Readings from Last 3 Encounters:  07/31/23 166 lb 4.8 oz (75.4 kg)  06/19/23 169 lb 1.6 oz (76.7 kg)  03/06/23 173 lb 11.2 oz (78.8 kg)      Physical Exam Vitals reviewed.  Constitutional:      General: She is not in acute distress.    Appearance: She is well-developed. She is not ill-appearing.  Eyes:     Pupils: Pupils are equal, round, and reactive to light.  Neck:     Thyroid : No thyromegaly.     Vascular: No JVD.  Cardiovascular:     Rate and Rhythm: Normal rate and regular rhythm.     Heart sounds:     No gallop.  Pulmonary:     Effort: Pulmonary effort is normal. No respiratory distress.     Breath sounds: Normal breath sounds. No wheezing or rales.  Musculoskeletal:     Cervical back: Neck supple.     Right lower leg: No edema.     Left lower leg: No edema.  Neurological:     Mental Status: She is alert.      No results found for any visits on 07/31/23.  Last CBC Lab Results  Component Value Date   WBC 3.6 (L) 03/03/2021   HGB 11.8 (L) 03/03/2021   HCT 36.4 03/03/2021   MCV 94.1 03/03/2021   MCH 30.3 07/31/2015   RDW 14.3 03/03/2021   PLT 181.0 03/03/2021   Last metabolic panel Lab Results  Component Value Date   GLUCOSE 117 (H) 03/06/2023   NA 138 03/06/2023   K 3.8 03/06/2023   CL 107 03/06/2023   CO2 28 03/06/2023   BUN 13 03/06/2023   CREATININE 0.64 03/06/2023   GFR 79.69 03/06/2023   CALCIUM  9.2 03/06/2023   PROT 6.4 03/06/2023   ALBUMIN 3.9 03/06/2023   BILITOT 1.2 03/06/2023   ALKPHOS 75 03/06/2023   AST 16 03/06/2023   ALT 10 03/06/2023   ANIONGAP 7 07/31/2015   Last lipids Lab Results  Component  Value Date   CHOL 111 03/06/2023   HDL 65.70 03/06/2023   LDLCALC 36 03/06/2023   TRIG 51.0 03/06/2023   CHOLHDL 2 03/06/2023   Last hemoglobin A1c Lab Results  Component Value Date   HGBA1C 7.0 (A) 06/19/2023      The ASCVD Risk score (Arnett DK, et al., 2019) failed to calculate for the following reasons:   The 2019 ASCVD risk score is only valid for ages 42 to 20    Assessment & Plan:   #1 hypertension improved with recent addition  of amlodipine  5 mg daily to her losartan  HCTZ.  Continue current regimen.  Continue low-sodium diet and regular exercise and weight loss efforts.  Reassess 4 months.  #2 type 2 diabetes controlled with recent A1c 7.0%.  Last A1c was less than 3 months ago.  Reassess at 21-month visit.  Continue lower glycemic diet.  Metformin  sent to her new pharmacy  #3 hyperlipidemia treated with atorvastatin .  Recent LDL cholesterol 36.  Sending Lipitor to her new pharmacy  Glean Lamy, MD

## 2023-07-31 NOTE — Patient Instructions (Signed)
 Blood pressure much improved.  Keep up the good work with exercise and weight loss  Let's plan on 4 month follow up.[name]

## 2023-08-02 ENCOUNTER — Other Ambulatory Visit: Payer: Self-pay | Admitting: Hematology and Oncology

## 2023-08-02 DIAGNOSIS — Z1231 Encounter for screening mammogram for malignant neoplasm of breast: Secondary | ICD-10-CM

## 2023-08-10 ENCOUNTER — Ambulatory Visit: Payer: Self-pay

## 2023-08-10 ENCOUNTER — Encounter: Payer: Self-pay | Admitting: Family Medicine

## 2023-08-10 ENCOUNTER — Ambulatory Visit: Admitting: Family Medicine

## 2023-08-10 VITALS — BP 124/62 | HR 70 | Temp 98.5°F | Wt 168.2 lb

## 2023-08-10 DIAGNOSIS — I10 Essential (primary) hypertension: Secondary | ICD-10-CM | POA: Diagnosis not present

## 2023-08-10 DIAGNOSIS — M25472 Effusion, left ankle: Secondary | ICD-10-CM

## 2023-08-10 DIAGNOSIS — B029 Zoster without complications: Secondary | ICD-10-CM

## 2023-08-10 DIAGNOSIS — M25471 Effusion, right ankle: Secondary | ICD-10-CM

## 2023-08-10 MED ORDER — LOSARTAN POTASSIUM-HCTZ 50-12.5 MG PO TABS: 2.0000 | ORAL_TABLET | Freq: Every day | ORAL | Status: AC

## 2023-08-10 MED ORDER — VALACYCLOVIR HCL 1 G PO TABS: 1000.0000 mg | ORAL_TABLET | Freq: Three times a day (TID) | ORAL | 0 refills | Status: AC

## 2023-08-10 NOTE — Progress Notes (Signed)
   Subjective:    Patient ID: [name] [name], female    DOB: [birth year], 89 y.o.   MRN: [id number]  HPI Here for the onset a week ago of mild swelling in her ankles. No SOB. Her last creatinine on 03-06-23 was normal at 0.64. Of note she saw Dr. Darren Em on 06-19-23, and her BP was a little high. She had been taking Losartan  HCT 50-12.5 daily for some years. He added Amlodipine  5 mg daily to this. Her BP has been doing well. Also about 2 weeks ago she developed an itchy rash on the right upper chest area. There is no pain.    Review of Systems  Constitutional: Negative.   Respiratory: Negative.    Cardiovascular:  Positive for leg swelling. Negative for chest pain and palpitations.  Skin:  Positive for rash.       Objective:   Physical Exam Constitutional:      Appearance: Normal appearance. She is not ill-appearing.   Cardiovascular:     Rate and Rhythm: Normal rate and regular rhythm.     Pulses: Normal pulses.     Heart sounds: Normal heart sounds.  Pulmonary:     Effort: Pulmonary effort is normal.     Breath sounds: Normal breath sounds.   Musculoskeletal:     Comments: 2+ edema in both ankles    Skin:    Comments: The entire right upper chest is covered with clusters of red vesicles and papules. No warmth or tenderness    Neurological:     Mental Status: She is alert.           Assessment & Plan:  Her HTN has been stable, but she has developed ankle edema that is likely a side effect of the Amlodipine . We will stop this and instead we will double up on the Losartan  HCT to take 2 pills every day. She will follow up in one month. She also has shingles on her chest, so we will treat this with 10 days of Valtrex 1000 mg TID.  [name] Diego, MD

## 2023-08-10 NOTE — Telephone Encounter (Signed)
 FYI Only or Action Required?: Action required by provider: request for appointment.  Patient was last seen in primary care on 07/31/2023 by Marquetta Sit, MD. Called Nurse Triage reporting Rash (hives. Symptoms began several days ago. Interventions attempted: OTC medications: Cortisone crea, Benadryl, Vaseline. Symptoms are: unchanged.  Triage Disposition: See PCP When Office is Open (Within 3 Days)  Patient/caregiver understands and will follow disposition?: yes        Copied from CRM [id number]. Topic: Clinical - Red Word Triage >> Aug 10, 2023  9:01 AM [name] [name] wrote: [name] [name] that prompted transfer to Nurse Triage: Just started taking the amLODipine  (NORVASC ) 5 MG tablet blood pressure pills has a rash that's spreading over upper body and diarrhea and ankles swollen. Reason for Disposition  [1] Severe localized itching AND [2] after 2 days of steroid cream  Answer Assessment - Initial Assessment Questions 1. APPEARANCE of RASH: Describe the rash.      Left chest and round neck and left side waistline  red hives 2. LOCATION: Where is the rash located?      see above 5. ONSET: When did the rash start?      Sunday  6. ITCHING: Does the rash itch? If Yes, ask: How bad is the itch?  (Scale 0-10; or none, mild, moderate, severe)     severe 7. PAIN: Does the rash hurt? If Yes, ask: How bad is the pain?  (Scale 0-10; or none, mild, moderate, severe)    - NONE (0): no pain    - MILD (1-3): doesn't interfere with normal activities     - MODERATE (4-7): interferes with normal activities or awakens from sleep     - SEVERE (8-10): excruciating pain, unable to do any normal activities     no 8. OTHER SYMPTOMS: Do you have any other symptoms? (e.g., fever)     Yesterday fever (subjective), diarrhea, ankles swollen   9. PREGNANCY: Is there any chance you are pregnant? When was your last menstrual period?     N/a  Protocols used: Rash or Redness - Localized-A-AH

## 2023-08-28 ENCOUNTER — Ambulatory Visit
Admission: RE | Admit: 2023-08-28 | Discharge: 2023-08-28 | Disposition: A | Discharge status: Home / Self Care | Source: Ambulatory Visit | Attending: Hematology and Oncology

## 2023-08-28 DIAGNOSIS — Z1231 Encounter for screening mammogram for malignant neoplasm of breast: Secondary | ICD-10-CM

## 2023-08-31 ENCOUNTER — Other Ambulatory Visit: Payer: Self-pay | Admitting: Hematology and Oncology

## 2023-08-31 DIAGNOSIS — R928 Other abnormal and inconclusive findings on diagnostic imaging of breast: Secondary | ICD-10-CM

## 2023-09-04 ENCOUNTER — Ambulatory Visit
Admission: RE | Admit: 2023-09-04 | Discharge: 2023-09-04 | Disposition: A | Discharge status: Home / Self Care | Source: Ambulatory Visit | Attending: Hematology and Oncology | Admitting: Hematology and Oncology

## 2023-09-04 ENCOUNTER — Other Ambulatory Visit: Payer: Self-pay | Admitting: Hematology and Oncology

## 2023-09-04 DIAGNOSIS — R928 Other abnormal and inconclusive findings on diagnostic imaging of breast: Secondary | ICD-10-CM

## 2023-09-04 DIAGNOSIS — N6310 Unspecified lump in the right breast, unspecified quadrant: Secondary | ICD-10-CM

## 2023-09-07 ENCOUNTER — Ambulatory Visit
Admission: RE | Admit: 2023-09-07 | Discharge: 2023-09-07 | Disposition: A | Discharge status: Home / Self Care | Source: Ambulatory Visit | Attending: Hematology and Oncology | Admitting: Hematology and Oncology

## 2023-09-07 ENCOUNTER — Inpatient Hospital Stay
Admission: RE | Admit: 2023-09-07 | Discharge: 2023-09-07 | Discharge status: Home / Self Care | Source: Ambulatory Visit | Attending: Hematology and Oncology

## 2023-09-07 DIAGNOSIS — R928 Other abnormal and inconclusive findings on diagnostic imaging of breast: Secondary | ICD-10-CM

## 2023-09-07 DIAGNOSIS — N6310 Unspecified lump in the right breast, unspecified quadrant: Secondary | ICD-10-CM

## 2023-09-07 HISTORY — PX: BREAST BIOPSY: SHX20

## 2023-09-08 LAB — SURGICAL PATHOLOGY

## 2023-09-11 ENCOUNTER — Telehealth: Payer: Self-pay

## 2023-09-11 ENCOUNTER — Other Ambulatory Visit

## 2023-09-11 ENCOUNTER — Encounter

## 2023-09-11 NOTE — Telephone Encounter (Signed)
 Pt called to r/s her current 7/28 appt with Dr Odean. She states her US  was negative for malignancy so she is OK to wait and see him in December for her annual f/u. Pt was r/s for 01/29/24 at 1345. She is agreeable and knows to call with any concerns in the interim.

## 2023-09-18 ENCOUNTER — Ambulatory Visit: Admitting: Hematology and Oncology

## 2023-12-04 ENCOUNTER — Ambulatory Visit: Admitting: Family Medicine

## 2023-12-04 ENCOUNTER — Encounter: Payer: Self-pay | Admitting: Family Medicine

## 2023-12-04 VITALS — BP 152/72 | HR 73 | Temp 97.8°F | Wt 163.1 lb

## 2023-12-04 DIAGNOSIS — E783 Hyperchylomicronemia: Secondary | ICD-10-CM | POA: Diagnosis not present

## 2023-12-04 DIAGNOSIS — Z7984 Long term (current) use of oral hypoglycemic drugs: Secondary | ICD-10-CM | POA: Diagnosis not present

## 2023-12-04 DIAGNOSIS — E1165 Type 2 diabetes mellitus with hyperglycemia: Secondary | ICD-10-CM | POA: Diagnosis not present

## 2023-12-04 DIAGNOSIS — I10 Essential (primary) hypertension: Secondary | ICD-10-CM | POA: Diagnosis not present

## 2023-12-04 LAB — POCT GLYCOSYLATED HEMOGLOBIN (HGB A1C): Hemoglobin A1C: 6.8 % — AB (ref 4.0–5.6)

## 2023-12-04 MED ORDER — DILTIAZEM HCL ER COATED BEADS 120 MG PO CP24: 120.0000 mg | ORAL_CAPSULE | Freq: Every day | ORAL | 3 refills | Status: AC

## 2023-12-04 NOTE — Patient Instructions (Signed)
 ADD the Cardizem CD one daily- IN ADDITION to current dose of Losartan .   Set up one month follow up.

## 2023-12-04 NOTE — Progress Notes (Signed)
 Established Patient Office Visit  Subjective   Patient ID: [name] [name], female    DOB: [birth year]  Age: 89 y.o. MRN: [id number]  Chief Complaint  Patient presents with   Medical Management of Chronic Issues    HPI   [name] [name] is here for medical follow-up.  Last summer her blood pressure was up and we added amlodipine  5 mg daily to her losartan  HCTZ.  She had very good blood pressure response but developed some edema in her ankles.  She was seen in follow-up by another provider and they had her stop amlodipine  and double up her losartan .  Since that time she has had several home readings mid 140s systolic.  Denies any further ankle edema.  Denies any headaches or dizziness.  No chest pains.  Does ride her exercise bike's time usually 2 times per week.  She has had some gradual weight loss this past year and has made some minor dietary changes.  Weight is down about 10 pounds from January.  She remains on atorvastatin  for hyperlipidemia with no recent myalgias.  Type 2 diabetes treated with metformin  and glipizide .  No recent hypoglycemic symptoms.  Last A1c 7.0%.  She states she had eye exam back around August.  Past Medical History:  Diagnosis Date   Arthritis    Breast cancer (HCC) 2017   Right Breast Cancer   Breast cancer of upper-outer quadrant of right female breast (HCC) 06/04/2015   Chronic kidney disease    stones   Diabetes mellitus    Hyperlipidemia    Hypertension    Personal history of radiation therapy 2017   Right Breast Cancer   PONV (postoperative nausea and vomiting)    UTI (urinary tract infection)    Past Surgical History:  Procedure Laterality Date   ABDOMINAL HYSTERECTOMY  1980   BREAST BIOPSY Right 2018/6   benign   BREAST BIOPSY Right 09/07/2023   US  RT BREAST BX W LOC DEV 1ST LESION IMG BX SPEC US  GUIDE 09/07/2023 [facility]   BREAST LUMPECTOMY Right 2017   CATARACT EXTRACTION     CHOLECYSTECTOMY  1989   COLONOSCOPY     RADIOACTIVE  SEED GUIDED PARTIAL MASTECTOMY WITH AXILLARY SENTINEL LYMPH NODE BIOPSY Right 07/09/2015   Procedure: RADIOACTIVE SEED GUIDED PARTIAL MASTECTOMY WITH AXILLARY SENTINEL LYMPH NODE BIOPSY;  Surgeon: Debby Shipper, MD;  Location: [facility];  Service: General;  Laterality: Right;   RE-EXCISION OF BREAST LUMPECTOMY Right 07/31/2015   Procedure: RE-EXCISION OF RIGHT BREAST LUMPECTOMY;  Surgeon: Debby Shipper, MD;  Location: [facility];  Service: General;  Laterality: Right;   TONSILLECTOMY  1953    reports that she has never smoked. She has never used smokeless tobacco. She reports that she does not drink alcohol and does not use drugs. family history includes Cancer in her mother; Diabetes in her brother and sister; Heart disease (age of onset: 48) in her father. Allergies  Allergen Reactions   Percocet [Oxycodone -Acetaminophen ] Nausea And Vomiting   Acetaminophen  Nausea And Vomiting and Other (See Comments)    Allergic to Tylenol  #3   Actos [Pioglitazone Hydrochloride] Hives   Amlodipine  Swelling   Latex Swelling and Other (See Comments)    Lip swelling   Lisinopril Cough   Shrimp [Shellfish Allergy] Itching   Zocor [Simvastatin - High Dose] Itching   Penicillins Rash and Other (See Comments)    Has patient had a PCN reaction causing immediate rash, facial/tongue/throat swelling, SOB or lightheadedness with hypotension: yes  Has patient had a PCN reaction causing severe rash involving mucus membranes or skin necrosis: no Has patient had a PCN reaction that required hospitalization no Has patient had a PCN reaction occurring within the last 10 years: no If all of the above answers are NO, then may proceed with Cephalosporin use.     Review of Systems  Constitutional:  Negative for malaise/fatigue.  Eyes:  Negative for blurred vision.  Respiratory:  Negative for shortness of breath.   Cardiovascular:  Negative for chest pain.  Neurological:  Negative for dizziness, weakness and  headaches.      Objective:     BP (!) 152/72   Pulse 73   Temp 97.8 F (36.6 C) (Oral)   Wt 163 lb 1.6 oz (74 kg)   SpO2 96%   BMI 30.82 kg/m  BP Readings from Last 3 Encounters:  12/04/23 (!) 152/72  08/10/23 124/62  07/31/23 124/62   Wt Readings from Last 3 Encounters:  12/04/23 163 lb 1.6 oz (74 kg)  08/10/23 168 lb 3.2 oz (76.3 kg)  07/31/23 166 lb 4.8 oz (75.4 kg)      Physical Exam Vitals reviewed.  Constitutional:      General: She is not in acute distress.    Appearance: She is well-developed. She is not ill-appearing.  Eyes:     Pupils: Pupils are equal, round, and reactive to light.  Neck:     Thyroid : No thyromegaly.     Vascular: No JVD.  Cardiovascular:     Rate and Rhythm: Normal rate and regular rhythm.     Heart sounds:     No gallop.  Pulmonary:     Effort: Pulmonary effort is normal. No respiratory distress.     Breath sounds: Normal breath sounds. No wheezing or rales.  Musculoskeletal:     Cervical back: Neck supple.     Right lower leg: No edema.     Left lower leg: No edema.  Neurological:     Mental Status: She is alert.      Results for orders placed or performed in visit on 12/04/23  POC HgB A1c  Result Value Ref Range   Hemoglobin A1C 6.8 (A) 4.0 - 5.6 %   HbA1c POC (<> result, manual entry)     HbA1c, POC (prediabetic range)     HbA1c, POC (controlled diabetic range)        The ASCVD Risk score (Arnett DK, et al., 2019) failed to calculate for the following reasons:   The 2019 ASCVD risk score is only valid for ages 21 to 41    Assessment & Plan:   #1 hypertension suboptimally controlled in terms of systolic blood pressure.  She had good response previously with amlodipine  but had some edema.  Currently on losartan /HCTZ 50/12.5 mg 1 twice daily.  We recommend adding Cardizem CD 120 mg daily.  She is aware there is some risk of edema but hopefully less than amlodipine .  Continue low-sodium diet.  Set up 1 month  follow-up  #2 type 2 diabetes fairly well-controlled A1c today 6.8%.  Continue glipizide  and metformin .  #3 hyperlipidemia treated with atorvastatin  20 mg daily.  LDL cholesterol in January was 36 with HDL of 65.  Continue current dose of atorvastatin   #4 health maintenance.  Recommended flu vaccine but she declines   Return in about 1 month (around 01/04/2024).    Wolm Scarlet, MD

## 2024-01-08 ENCOUNTER — Ambulatory Visit: Admitting: Family Medicine

## 2024-01-08 ENCOUNTER — Encounter: Payer: Self-pay | Admitting: Family Medicine

## 2024-01-08 ENCOUNTER — Ambulatory Visit: Payer: Self-pay | Admitting: Family Medicine

## 2024-01-08 VITALS — BP 142/80 | HR 66 | Temp 97.5°F | Wt 168.4 lb

## 2024-01-08 DIAGNOSIS — I10 Essential (primary) hypertension: Secondary | ICD-10-CM

## 2024-01-08 DIAGNOSIS — E783 Hyperchylomicronemia: Secondary | ICD-10-CM

## 2024-01-08 LAB — COMPREHENSIVE METABOLIC PANEL WITH GFR
ALT: 12 U/L (ref 0–35)
AST: 17 U/L (ref 0–37)
Albumin: 3.9 g/dL (ref 3.5–5.2)
Alkaline Phosphatase: 73 U/L (ref 39–117)
BUN: 8 mg/dL (ref 6–23)
CO2: 28 meq/L (ref 19–32)
Calcium: 8.7 mg/dL (ref 8.4–10.5)
Chloride: 101 meq/L (ref 96–112)
Creatinine, Ser: 0.68 mg/dL (ref 0.40–1.20)
GFR: 78.07 mL/min (ref >60.00)
Glucose, Bld: 83 mg/dL (ref 70–99)
Potassium: 3.7 meq/L (ref 3.5–5.1)
Sodium: 137 meq/L (ref 135–145)
Total Bilirubin: 1.2 mg/dL (ref 0.2–1.2)
Total Protein: 6.6 g/dL (ref 6.0–8.3)

## 2024-01-08 LAB — LIPID PANEL
Cholesterol: 100 mg/dL (ref 0–200)
HDL: 65.6 mg/dL (ref >39.00)
LDL Cholesterol: 27 mg/dL (ref 0–99)
NonHDL: 34.51
Total CHOL/HDL Ratio: 2
Triglycerides: 37 mg/dL (ref 0.0–149.0)
VLDL: 7.4 mg/dL (ref 0.0–40.0)

## 2024-01-08 NOTE — Progress Notes (Signed)
 Established Patient Office Visit  Subjective   Patient ID: [name] [name], female    DOB: 1934-03-25  Age: 89 y.o. MRN: [id number]  Chief Complaint  Patient presents with   Medical Management of Chronic Issues    HPI   [name] is here for chronic medical follow-up.  Last visit her blood pressure was elevated and we added back calcium  channel blocker with diltiazem CD 120 mg daily.  She had previous edema issues with amlodipine .  Tolerating Cardizem without any major side effects.  She states she had recent blood pressure at [facility] either 134 or 154 systolic.  She could not remember.  She remains on losartan /HCTZ.  Denies any headaches or dizziness.  Yesterday ate out with likely high sodium meal.  She thinks that may be why blood pressure is up somewhat today.  She has hyperlipidemia treated with atorvastatin  20 mg daily.  Denies any myalgias.  She is requesting follow-up fasting lipids today and chemistries.  Her diabetes is reasonably well-controlled with recent A1c 6.8% and treated with metformin  and glipizide .  No recent hypoglycemic symptoms.  She declines influenza vaccine.  Past Medical History:  Diagnosis Date   Arthritis    Breast cancer (HCC) 2017   Right Breast Cancer   Breast cancer of upper-outer quadrant of right female breast (HCC) 06/04/2015   Chronic kidney disease    stones   Diabetes mellitus    Hyperlipidemia    Hypertension    Personal history of radiation therapy 2017   Right Breast Cancer   PONV (postoperative nausea and vomiting)    UTI (urinary tract infection)    Past Surgical History:  Procedure Laterality Date   ABDOMINAL HYSTERECTOMY  1980   BREAST BIOPSY Right 2018/6   benign   BREAST BIOPSY Right 09/07/2023   US  RT BREAST BX W LOC DEV 1ST LESION IMG BX SPEC US  GUIDE 09/07/2023 [facility]   BREAST LUMPECTOMY Right 2017   CATARACT EXTRACTION     CHOLECYSTECTOMY  1989   COLONOSCOPY     RADIOACTIVE SEED GUIDED PARTIAL MASTECTOMY WITH  AXILLARY SENTINEL LYMPH NODE BIOPSY Right 07/09/2015   Procedure: RADIOACTIVE SEED GUIDED PARTIAL MASTECTOMY WITH AXILLARY SENTINEL LYMPH NODE BIOPSY;  Surgeon: Debby Shipper, MD;  Location: [facility];  Service: General;  Laterality: Right;   RE-EXCISION OF BREAST LUMPECTOMY Right 07/31/2015   Procedure: RE-EXCISION OF RIGHT BREAST LUMPECTOMY;  Surgeon: Debby Shipper, MD;  Location: [facility];  Service: General;  Laterality: Right;   TONSILLECTOMY  1953    reports that she has never smoked. She has never used smokeless tobacco. She reports that she does not drink alcohol and does not use drugs. family history includes Cancer in her mother; Diabetes in her brother and sister; Heart disease (age of onset: 31) in her father. Allergies  Allergen Reactions   Percocet [Oxycodone -Acetaminophen ] Nausea And Vomiting   Acetaminophen  Nausea And Vomiting and Other (See Comments)    Allergic to Tylenol  #3   Actos [Pioglitazone Hydrochloride] Hives   Amlodipine  Swelling   Latex Swelling and Other (See Comments)    Lip swelling   Lisinopril Cough   Shrimp [Shellfish Allergy] Itching   Zocor [Simvastatin - High Dose] Itching   Penicillins Rash and Other (See Comments)    Has patient had a PCN reaction causing immediate rash, facial/tongue/throat swelling, SOB or lightheadedness with hypotension: yes Has patient had a PCN reaction causing severe rash involving mucus membranes or skin necrosis: no Has patient had a  PCN reaction that required hospitalization no Has patient had a PCN reaction occurring within the last 10 years: no If all of the above answers are NO, then may proceed with Cephalosporin use.     Review of Systems  Constitutional:  Negative for malaise/fatigue.  Eyes:  Negative for blurred vision.  Respiratory:  Negative for shortness of breath.   Cardiovascular:  Negative for chest pain.  Neurological:  Negative for dizziness, weakness and headaches.      Objective:      BP (!) 142/80 (BP Location: Left Arm, Cuff Size: Normal)   Pulse 66   Temp (!) 97.5 F (36.4 C) (Oral)   Wt 168 lb 6.4 oz (76.4 kg)   SpO2 96%   BMI 31.82 kg/m  BP Readings from Last 3 Encounters:  01/08/24 (!) 142/80  12/04/23 (!) 152/72  08/10/23 124/62   Wt Readings from Last 3 Encounters:  01/08/24 168 lb 6.4 oz (76.4 kg)  12/04/23 163 lb 1.6 oz (74 kg)  08/10/23 168 lb 3.2 oz (76.3 kg)      Physical Exam Vitals reviewed.  Constitutional:      General: She is not in acute distress.    Appearance: She is well-developed. She is not ill-appearing.  Eyes:     Pupils: Pupils are equal, round, and reactive to light.  Neck:     Thyroid : No thyromegaly.     Vascular: No JVD.  Cardiovascular:     Rate and Rhythm: Normal rate and regular rhythm.     Heart sounds:     No gallop.  Pulmonary:     Effort: Pulmonary effort is normal. No respiratory distress.     Breath sounds: Normal breath sounds. No wheezing or rales.  Musculoskeletal:     Cervical back: Neck supple.     Right lower leg: No edema.     Left lower leg: No edema.  Neurological:     Mental Status: She is alert.      No results found for any visits on 01/08/24.  Last CBC Lab Results  Component Value Date   WBC 3.6 (L) 03/03/2021   HGB 11.8 (L) 03/03/2021   HCT 36.4 03/03/2021   MCV 94.1 03/03/2021   MCH 30.3 07/31/2015   RDW 14.3 03/03/2021   PLT 181.0 03/03/2021   Last metabolic panel Lab Results  Component Value Date   GLUCOSE 117 (H) 03/06/2023   NA 138 03/06/2023   K 3.8 03/06/2023   CL 107 03/06/2023   CO2 28 03/06/2023   BUN 13 03/06/2023   CREATININE 0.64 03/06/2023   GFR 79.69 03/06/2023   CALCIUM  9.2 03/06/2023   PROT 6.4 03/06/2023   ALBUMIN 3.9 03/06/2023   BILITOT 1.2 03/06/2023   ALKPHOS 75 03/06/2023   AST 16 03/06/2023   ALT 10 03/06/2023   ANIONGAP 7 07/31/2015   Last lipids Lab Results  Component Value Date   CHOL 111 03/06/2023   HDL 65.70 03/06/2023    LDLCALC 36 03/06/2023   TRIG 51.0 03/06/2023   CHOLHDL 2 03/06/2023   Last hemoglobin A1c Lab Results  Component Value Date   HGBA1C 6.8 (A) 12/04/2023      The ASCVD Risk score (Arnett DK, et al., 2019) failed to calculate for the following reasons:   The 2019 ASCVD risk score is only valid for ages 56 to 63    Assessment & Plan:   #1 hypertension.  Recent poor control.  Repeat reading today 142/80 after rest.  Overall  improved compared to last visit but still suboptimal.  We discussed possible titration of Cardizem but patient prefers giving this 3 more months of lifestyle modification with reduced sodium and try to lose some weight.  Set up 57-month follow-up.  Handout on hypertension given  #2 hyperlipidemia treated with atorvastatin  20 mg daily and tolerating well.  Check fasting lipid and CMP today  #3 type 2 diabetes controlled with recent A1c 6.8% on glipizide  and metformin .  Continue yearly diabetic eye exam.   Return in about 3 months (around 04/09/2024).    Wolm Scarlet, MD

## 2024-01-08 NOTE — Patient Instructions (Signed)
 Try to keep daily sodium < 2,500 mg   Set up 3 month follow up.

## 2024-01-24 NOTE — Assessment & Plan Note (Signed)
 Right lumpectomy 07/09/2015: Invasive lobular cancer, grade 2, 1.9 cm, LCIS, ADH, lateral margin focally positive, 0/2 lymph nodes negative, ER 95%, PR 80%, Ki-67 15%, HER-2 negative, T1cN0 stage IA   Adjuvant radiation therapy 09/01/2015 to 09/28/2015   Treatment plan: Adjuvant antiestrogen therapy with anastrozole  1 mg started 10/12/2020 Anastrozole  toxicities: hot flashes and joint pains   Breast cancer surveillance: Breast exam 01/29/2024: Benign 2. Mammogram 08/30/2023: rt breast focal assymetry 2.5 cm (fat necrosis), breast density category B 3. Bone density 12/07/2022: T score - 1.2: Mild osteopenia   Return to clinic in 1 year for follow-up

## 2024-01-29 ENCOUNTER — Inpatient Hospital Stay: Attending: Hematology and Oncology | Admitting: Hematology and Oncology

## 2024-01-29 VITALS — BP 140/48 | HR 57 | Temp 97.8°F | Resp 18 | Ht 61.0 in | Wt 165.8 lb

## 2024-01-29 DIAGNOSIS — Z923 Personal history of irradiation: Secondary | ICD-10-CM | POA: Insufficient documentation

## 2024-01-29 DIAGNOSIS — Z170 Estrogen receptor positive status [ER+]: Secondary | ICD-10-CM | POA: Diagnosis not present

## 2024-01-29 DIAGNOSIS — Z79811 Long term (current) use of aromatase inhibitors: Secondary | ICD-10-CM | POA: Insufficient documentation

## 2024-01-29 DIAGNOSIS — M8580 Other specified disorders of bone density and structure, unspecified site: Secondary | ICD-10-CM | POA: Diagnosis not present

## 2024-01-29 DIAGNOSIS — C50411 Malignant neoplasm of upper-outer quadrant of right female breast: Secondary | ICD-10-CM | POA: Insufficient documentation

## 2024-01-29 NOTE — Progress Notes (Signed)
 ]  Patient Care Team: [name] ORN, MD as PCP - General (Family Medicine) [name], NP as Nurse Practitioner (Hematology and Oncology)  DIAGNOSIS:  Encounter Diagnosis  Name Primary?   Malignant neoplasm of upper-outer quadrant of right breast in female, estrogen receptor positive (HCC) Yes    SUMMARY OF ONCOLOGIC HISTORY: Oncology History  Breast cancer of upper-outer quadrant of right female breast (HCC)  06/02/2015 Initial Diagnosis   Right breast biopsy 10:00 position: Invasive lobular cancer with LCIS, ER 95%, PR 80%, Ki-67 15%, HER-2 negative duration 1.61, ultrasound revealed a 1.9 x 1 x 1.4 cm lesion, T1 cN0 stage IA clinical stage   07/09/2015 Surgery   Right lumpectomy: Invasive lobular cancer, grade 2, 1.9 cm, LCIS, ADH, lateral margin focally positive, 0/2 lymph nodes negative, ER 95%, PR 80%, Ki-67 15%, HER-2 negative, T1cN0 stage IA   07/31/2015 Surgery   Reresection surgical margins: Clear, Benign   09/01/2015 - 09/28/2015 Radiation Therapy   Adjuvant radiation therapy   10/22/2015 -  Anti-estrogen oral therapy   Anastrozole  1 mg daily     CHIEF COMPLIANT: Surveillance of breast cancer  HISTORY OF PRESENT ILLNESS:   History of Present Illness [name] [name] is an 89 year old female with breast cancer who presents for follow-up after a benign breast biopsy.  She was diagnosed with breast cancer three and a half years ago and completed radiation therapy in 2017. She took letrozole for 7 years and stopped two years ago due to bone pain and stiffness. Bone density testing last year showed a T-score of minus 1.2.  In July she had a breast biopsy that was benign, consistent with fat necrosis.      ALLERGIES:  is allergic to percocet [oxycodone -acetaminophen ], acetaminophen , actos [pioglitazone hydrochloride], amlodipine , latex, lisinopril, shrimp [shellfish allergy], zocor [simvastatin - high dose], and penicillins.  MEDICATIONS:  Current  Outpatient Medications  Medication Sig Dispense Refill   ACCU-CHEK GUIDE test strip USE TO CHECK BLOOD SUGAR TWICE DAILY 100 each 3   atorvastatin  (LIPITOR) 20 MG tablet TAKE 1 TABLET BY MOUTH DAILY AT 6 PM. 90 tablet 1   Blood Glucose Monitoring Suppl (ONE TOUCH ULTRA SYSTEM KIT) w/Device KIT Use as directed. DX: E11.65. 1 each 0   diltiazem  (CARDIZEM  CD) 120 MG 24 hr capsule Take 1 capsule (120 mg total) by mouth daily. 90 capsule 3   glipiZIDE  (GLUCOTROL ) 5 MG tablet TAKE 1 TABLET BY MOUTH EVERY DAY BEFORE BREAKFAST 90 tablet 1   Glucosamine-Chondroitin (OSTEO BI-FLEX REGULAR STRENGTH PO) Take 1 tablet by mouth daily as needed (For joint health.). Reported on 08/17/2015     losartan -hydrochlorothiazide  (HYZAAR) 50-12.5 MG tablet Take 2 tablets by mouth daily.     metFORMIN  (GLUCOPHAGE -XR) 500 MG 24 hr tablet Take 1 tablet (500 mg total) by mouth 2 (two) times daily. 180 tablet 1   ONE TOUCH LANCETS MISC Check Blood Sugars twice per day. DX: E11.65 100 each 5   timolol (TIMOPTIC) 0.5 % ophthalmic solution INSTILL 1 DROP INTO BOTH EYES EVERY DAY     latanoprost (XALATAN) 0.005 % ophthalmic solution Place 1 drop into both eyes at bedtime.  4   No current facility-administered medications for this visit.    PHYSICAL EXAMINATION: ECOG PERFORMANCE STATUS: 1 - Symptomatic but completely ambulatory  Vitals:   01/29/24 1333  BP: (!) 140/48  Pulse: (!) 57  Resp: 18  Temp: 97.8 F (36.6 C)  SpO2: 100%   Filed Weights   01/29/24 1333  Weight: 165 lb 12.8 oz (75.2 kg)    Physical Exam Breast exam: No palpable lumps or nodules in bilateral breasts or axilla.  Right breast scar is palpated without any lumps.  (exam performed in the presence of a chaperone)  LABORATORY DATA:  I have reviewed the data as listed    Latest Ref Rng & Units 01/08/2024    9:04 AM 03/06/2023    8:58 AM 03/04/2022    8:35 AM  CMP  Glucose 70 - 99 mg/dL 83  882  801   BUN 6 - 23 mg/dL 8  13  14    Creatinine  0.40 - 1.20 mg/dL 9.31  9.35  9.31   Sodium 135 - 145 mEq/L 137  138  139   Potassium 3.5 - 5.1 mEq/L 3.7  3.8  3.5   Chloride 96 - 112 mEq/L 101  107  103   CO2 19 - 32 mEq/L 28  28  29    Calcium  8.4 - 10.5 mg/dL 8.7  9.2  9.0   Total Protein 6.0 - 8.3 g/dL 6.6  6.4  6.6   Total Bilirubin 0.2 - 1.2 mg/dL 1.2  1.2  1.2   Alkaline Phos 39 - 117 U/L 73  75  67   AST 0 - 37 U/L 17  16  14    ALT 0 - 35 U/L 12  10  9      Lab Results  Component Value Date   WBC 3.6 (L) 03/03/2021   HGB 11.8 (L) 03/03/2021   HCT 36.4 03/03/2021   MCV 94.1 03/03/2021   PLT 181.0 03/03/2021   NEUTROABS 1.9 03/03/2021    ASSESSMENT & PLAN:  Breast cancer of upper-outer quadrant of right female breast (HCC) Right lumpectomy 07/09/2015: Invasive lobular cancer, grade 2, 1.9 cm, LCIS, ADH, lateral margin focally positive, 0/2 lymph nodes negative, ER 95%, PR 80%, Ki-67 15%, HER-2 negative, T1cN0 stage IA   Adjuvant radiation therapy 09/01/2015 to 09/28/2015   Treatment plan: Adjuvant antiestrogen therapy with anastrozole  1 mg started 10/13/2015-09/2021    Breast cancer surveillance: Breast exam 01/29/2024: Benign 2. Mammogram 08/30/2023: rt breast focal assymetry 2.5 cm (fat necrosis), breast density category B 3. Bone density 12/07/2022: T score - 1.2: Mild osteopenia   Return to clinic on an as-needed basis      No orders of the defined types were placed in this encounter.  The patient has a good understanding of the overall plan. she agrees with it. she will call with any problems that may develop before the next visit here.  I personally spent a total of 30 minutes in the care of the patient today including preparing to see the patient, getting/reviewing separately obtained history, performing a medically appropriate exam/evaluation, counseling and educating, placing orders, referring and communicating with other health care professionals, documenting clinical information in the EHR, independently  interpreting results, communicating results, and coordinating care.   [name], MD 01/29/24

## 2024-04-08 ENCOUNTER — Ambulatory Visit: Admitting: Family Medicine
